# Patient Record
Sex: Female | Born: 1950 | Race: White | Hispanic: No | Marital: Married | Smoking: Never smoker
Health system: Southern US, Community
[De-identification: ages and names within clinical notes are randomized; demographics above are authoritative.]

## PROBLEM LIST (undated history)

## (undated) DIAGNOSIS — M1990 Unspecified osteoarthritis, unspecified site: Secondary | ICD-10-CM

## (undated) DIAGNOSIS — E785 Hyperlipidemia, unspecified: Secondary | ICD-10-CM

## (undated) DIAGNOSIS — J189 Pneumonia, unspecified organism: Secondary | ICD-10-CM

## (undated) DIAGNOSIS — Z8719 Personal history of other diseases of the digestive system: Secondary | ICD-10-CM

## (undated) DIAGNOSIS — M5430 Sciatica, unspecified side: Secondary | ICD-10-CM

## (undated) DIAGNOSIS — R7303 Prediabetes: Secondary | ICD-10-CM

## (undated) HISTORY — PX: BREAST BIOPSY: SHX20

## (undated) HISTORY — PX: BREAST EXCISIONAL BIOPSY: SUR124

## (undated) HISTORY — PX: JOINT REPLACEMENT: SHX530

## (undated) HISTORY — PX: HIP ARTHROPLASTY: SHX981

---

## 1998-02-22 ENCOUNTER — Ambulatory Visit (HOSPITAL_COMMUNITY): Admission: RE | Admit: 1998-02-22 | Discharge: 1998-02-22 | Payer: Self-pay | Admitting: *Deleted

## 1998-03-01 ENCOUNTER — Ambulatory Visit (HOSPITAL_COMMUNITY): Admission: RE | Admit: 1998-03-01 | Discharge: 1998-03-01 | Payer: Self-pay | Admitting: *Deleted

## 1998-03-10 ENCOUNTER — Ambulatory Visit (HOSPITAL_COMMUNITY): Admission: RE | Admit: 1998-03-10 | Discharge: 1998-03-10 | Payer: Self-pay | Admitting: *Deleted

## 1998-04-30 ENCOUNTER — Ambulatory Visit (HOSPITAL_COMMUNITY): Admission: RE | Admit: 1998-04-30 | Discharge: 1998-04-30 | Payer: Self-pay | Admitting: *Deleted

## 1999-08-15 ENCOUNTER — Ambulatory Visit (HOSPITAL_COMMUNITY): Admission: RE | Admit: 1999-08-15 | Discharge: 1999-08-15 | Payer: Self-pay | Admitting: Family Medicine

## 1999-08-15 ENCOUNTER — Encounter: Payer: Self-pay | Admitting: Family Medicine

## 1999-10-03 ENCOUNTER — Ambulatory Visit (HOSPITAL_COMMUNITY): Admission: RE | Admit: 1999-10-03 | Discharge: 1999-10-03 | Payer: Self-pay | Admitting: Emergency Medicine

## 2000-02-23 ENCOUNTER — Encounter: Payer: Self-pay | Admitting: Family Medicine

## 2000-02-23 ENCOUNTER — Encounter: Admission: RE | Admit: 2000-02-23 | Discharge: 2000-02-23 | Payer: Self-pay | Admitting: Family Medicine

## 2000-08-24 ENCOUNTER — Other Ambulatory Visit: Admission: RE | Admit: 2000-08-24 | Discharge: 2000-08-24 | Payer: Self-pay | Admitting: Family Medicine

## 2000-08-24 ENCOUNTER — Encounter: Admission: RE | Admit: 2000-08-24 | Discharge: 2000-08-24 | Payer: Self-pay | Admitting: Family Medicine

## 2000-08-24 ENCOUNTER — Encounter: Payer: Self-pay | Admitting: Family Medicine

## 2000-09-13 ENCOUNTER — Ambulatory Visit (HOSPITAL_BASED_OUTPATIENT_CLINIC_OR_DEPARTMENT_OTHER): Admission: RE | Admit: 2000-09-13 | Discharge: 2000-09-13 | Payer: Self-pay | Admitting: General Surgery

## 2000-09-13 ENCOUNTER — Encounter: Payer: Self-pay | Admitting: General Surgery

## 2000-09-13 ENCOUNTER — Encounter: Admission: RE | Admit: 2000-09-13 | Discharge: 2000-09-13 | Payer: Self-pay | Admitting: General Surgery

## 2000-09-13 ENCOUNTER — Encounter (INDEPENDENT_AMBULATORY_CARE_PROVIDER_SITE_OTHER): Payer: Self-pay | Admitting: *Deleted

## 2001-12-30 ENCOUNTER — Encounter: Admission: RE | Admit: 2001-12-30 | Discharge: 2001-12-30 | Payer: Self-pay | Admitting: Family Medicine

## 2001-12-30 ENCOUNTER — Encounter: Payer: Self-pay | Admitting: Family Medicine

## 2005-10-11 ENCOUNTER — Encounter: Payer: Self-pay | Admitting: *Deleted

## 2006-07-04 ENCOUNTER — Encounter: Admission: RE | Admit: 2006-07-04 | Discharge: 2006-07-04 | Payer: Self-pay | Admitting: Family Medicine

## 2006-10-17 ENCOUNTER — Other Ambulatory Visit: Admission: RE | Admit: 2006-10-17 | Discharge: 2006-10-17 | Payer: Self-pay | Admitting: Family Medicine

## 2007-08-14 ENCOUNTER — Encounter: Admission: RE | Admit: 2007-08-14 | Discharge: 2007-08-14 | Payer: Self-pay | Admitting: Family Medicine

## 2008-01-08 ENCOUNTER — Other Ambulatory Visit: Admission: RE | Admit: 2008-01-08 | Discharge: 2008-01-08 | Payer: Self-pay | Admitting: Family Medicine

## 2008-09-23 ENCOUNTER — Encounter: Admission: RE | Admit: 2008-09-23 | Discharge: 2008-09-23 | Payer: Self-pay | Admitting: Family Medicine

## 2010-04-27 ENCOUNTER — Encounter: Admission: RE | Admit: 2010-04-27 | Discharge: 2010-04-27 | Payer: Self-pay | Admitting: Family Medicine

## 2010-10-28 NOTE — Op Note (Signed)
[place]  Patient:    Monica Martin, Monica Martin                    MRN: [id number] Proc. Date: 09/13/00 Adm. 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  POSTOPERATIVE DIAGNOSIS:  Abnormal left breast mammogram with atypical hyperplasia.  OPERATION:  Left breast biopsy with needle localization and specimen mammography.  SURGEON:  Rose Phi. Maple Hudson, M.D.  ANESTHESIA:  MAC  DESCRIPTION OF PROCEDURE:  The patient was placed on the operating table and the left breast prepped and draped in the usual fashion.  A curvilinear incision was outlined using the previously placed wire in the upper inner quadrant as a guide.  The area is then thoroughly infiltrated with 1% xylocaine with adrenalin.  Incision was made and with sharp dissection, the wire and surrounding tissue were excised.  Hemostasis was obtained with the cautery.  Specimen mammography confirmed the removal of the lesion.  Subcuticular closure with 4-0 Monocryl and Steri-Strips carried out.  Dressing applied.  The patient transferred to the recovery room in satisfactory condition having tolerated the procedure well. DD:  09/13/00 TD:  09/13/00 Job: [id number]

## 2011-05-10 ENCOUNTER — Other Ambulatory Visit: Payer: Self-pay | Admitting: Family Medicine

## 2011-05-10 DIAGNOSIS — Z1231 Encounter for screening mammogram for malignant neoplasm of breast: Secondary | ICD-10-CM

## 2011-05-24 ENCOUNTER — Ambulatory Visit
Admission: RE | Admit: 2011-05-24 | Discharge: 2011-05-24 | Disposition: A | Discharge status: Home / Self Care | Payer: BC Managed Care – PPO | Source: Ambulatory Visit | Attending: Family Medicine | Admitting: Family Medicine

## 2011-05-24 DIAGNOSIS — Z1231 Encounter for screening mammogram for malignant neoplasm of breast: Secondary | ICD-10-CM

## 2011-12-04 ENCOUNTER — Emergency Department (HOSPITAL_COMMUNITY): Payer: BC Managed Care – PPO

## 2011-12-04 ENCOUNTER — Encounter (HOSPITAL_COMMUNITY): Payer: Self-pay

## 2011-12-04 ENCOUNTER — Observation Stay (HOSPITAL_COMMUNITY)
Admission: EM | Admit: 2011-12-04 | Discharge: 2011-12-05 | Disposition: A | Discharge status: Home / Self Care | Payer: BC Managed Care – PPO | Attending: Emergency Medicine | Admitting: Emergency Medicine

## 2011-12-04 DIAGNOSIS — R079 Chest pain, unspecified: Principal | ICD-10-CM | POA: Insufficient documentation

## 2011-12-04 HISTORY — DX: Sciatica, unspecified side: M54.30

## 2011-12-04 LAB — POCT I-STAT TROPONIN I
Troponin i, poc: 0 ng/mL (ref 0.00–0.08)
Troponin i, poc: 0 ng/mL (ref 0.00–0.08)

## 2011-12-04 LAB — BASIC METABOLIC PANEL
BUN: 13 mg/dL (ref 6–23)
CO2: 30 mEq/L (ref 19–32)
Chloride: 102 mEq/L (ref 96–112)
Creatinine, Ser: 0.78 mg/dL (ref 0.50–1.10)
GFR calc Af Amer: >90 mL/min (ref >90)
Glucose, Bld: 95 mg/dL (ref 70–99)

## 2011-12-04 LAB — CBC
HCT: 48.4 % — ABNORMAL HIGH (ref 36.0–46.0)
Hemoglobin: 15.9 g/dL — ABNORMAL HIGH (ref 12.0–15.0)
MCV: 90.1 fL (ref 78.0–100.0)
RDW: 14.1 % (ref 11.5–15.5)
WBC: 8.9 10*3/uL (ref 4.0–10.5)

## 2011-12-04 MED ORDER — SODIUM CHLORIDE 0.9 % IV BOLUS (SEPSIS)
500.0000 mL | Freq: Once | INTRAVENOUS | Status: AC
  Administered 2011-12-04: 15:00:00 via INTRAVENOUS

## 2011-12-04 MED ORDER — METOPROLOL TARTRATE 25 MG PO TABS
100.0000 mg | ORAL_TABLET | Freq: Once | ORAL | Status: AC
  Administered 2011-12-04: 100 mg via ORAL
  Filled 2011-12-04: qty 4

## 2011-12-04 NOTE — ED Notes (Signed)
Pt. To CDU

## 2011-12-04 NOTE — ED Provider Notes (Signed)
4:07 PM  Medical screening examination/treatment/procedure(s) were conducted as a shared visit with non-physician practitioner(s) and myself. I personally evaluated the patient during the encounter  61 yo lady with chest pain in the center of chest radiating to chest and into the left arm. Started yesterday PM, worse this morning. No prior heart history in her, but she has several siblings with CAD. Exam shows normal heart and lungs, EKG benign, TNI negative. I advised her to be admitted to Chest Pain Observation.        Carleene Cooper III, MD 12/04/11 2103

## 2011-12-04 NOTE — ED Notes (Signed)
Pt. Denies any pain or discomfort no sob or n/v skin is w/d, resp. E/u.  Family at bedside

## 2011-12-04 NOTE — ED Provider Notes (Signed)
History     CSN: [id number]  Arrival date & time 12/04/11  1242   First MD Initiated Contact with Patient 12/04/11 1319      Chief Complaint  Patient presents with  . Chest Pain    (Consider location/radiation/quality/duration/timing/severity/associated sxs/prior treatment) The history is provided by the patient.   is a 61 year old female with no significant past medical history who presents to the emergency department with a chief complaint of substernal chest pain that began yesterday while at work. Pain severe, described as a pressure, radiated between the shoulder blades and to the left arm. There was associated headache, nausea, shortness of breath. The pain gradually improved on some and she was able to fall asleep around 3 PM, awoke at 3 AM with worsening of the pain. At that time, it was burning rather than a pressure and rather than a headache she had some mild lightheadedness. Rated that became fairly persistent around 5 AM and she ended up going to urgent care this morning who referred her to the ER for further evaluation. Her pain was completely relieved after 2 nitroglycerin. She was also treated prior to arrival with 4 baby aspirin. At the time of interview, her symptoms include only mild nausea and left arm tingling. Symptoms are worse with exertion and improved with rest. Denies any known personal coronary disease and has no risk factors such as cigarette use, hyperlipidemia, or diabetes. Her family history is significant for coronary disease in several siblings before the age of 77. No known personal or family history of DVT or PE, no recent prolonged travel, no lower extremity swelling or pain.  Past Medical History  Diagnosis Date  . Sciatica     History reviewed. No pertinent past surgical history.  No family history on file.  History  Substance Use Topics  . Smoking status: Never Smoker   . Smokeless tobacco: Not on file  . Alcohol Use: No     Review of  Systems 10 systems reviewed and are negative for acute change except as noted in the HPI.  Allergies  Review of patient's allergies indicates no known allergies.  Home Medications   Current Outpatient Rx  Name Route Sig Dispense Refill  . ASPIRIN 81 MG PO CHEW Oral Chew 81 mg by mouth daily.    Marland Kitchen CALCIUM CARBONATE-VITAMIN D 500-200 MG-UNIT PO TABS Oral Take 1 tablet by mouth daily.    . OMEGA-3 FATTY ACIDS 1000 MG PO CAPS Oral Take 1 g by mouth daily.     . MULTI-VITAMIN/MINERALS PO TABS Oral Take 1 tablet by mouth daily.    . RED YEAST RICE 600 MG PO CAPS Oral Take 1 capsule by mouth daily.      BP 142/91  Pulse 79  Temp 97.9 F (36.6 C) (Oral)  Resp 16  Ht 5\' 6"  (1.676 m)  Wt 190 lb (86.183 kg)  BMI 30.67 kg/m2  SpO2 99%  Physical Exam  Nursing note and vitals reviewed. Constitutional: She appears well-developed and well-nourished. No distress.  HENT:  Head: Normocephalic and atraumatic.  Eyes: Conjunctivae are normal. Pupils are equal, round, and reactive to light.  Neck: Neck supple.  Cardiovascular: Normal rate, regular rhythm and normal heart sounds.        Bilateral radial and DP pulses are 2+   Pulmonary/Chest: Effort normal and breath sounds normal. No respiratory distress. She has no wheezes. She exhibits no tenderness.  Abdominal: Soft. Bowel sounds are normal. She exhibits no distension. There is no  tenderness.  Musculoskeletal: She exhibits no edema.       Calves supple and non-tender.  Neurological: She is alert.       Speech clear. MAEW.  Skin: Skin is warm and dry. No rash noted. She is not diaphoretic.  Psychiatric: She has a normal mood and affect.    ED Course  Procedures (including critical care time)  Labs Reviewed  CBC - Abnormal; Notable for the following:    RBC 5.37 (*)     Hemoglobin 15.9 (*)     HCT 48.4 (*)     Platelets 471 (*)     All other components within normal limits  BASIC METABOLIC PANEL - Abnormal; Notable for the  following:    GFR calc non Af Amer 90 (*)     All other components within normal limits  TROPONIN I   Dg Chest 2 View  12/04/2011  *RADIOLOGY REPORT*  Clinical Data: Chest pain  CHEST - 2 VIEW  Comparison: None.  Findings: Cardiomediastinal silhouette is unremarkable.  No acute infiltrate or pleural effusion.  No pulmonary edema.  Minimal degenerative changes thoracic spine.  IMPRESSION: No active disease.  Original Report Authenticated By: Natasha Mead, M.D.      MDM  CP in pt with strong FH, story somewhat concerning. EKG nml, negative troponin x 1, otherwise appears slightly dehydrated (doubt polycythemia given lack of hx of same and only slight elevation in cells), nml CXR. TIMI score 0, HEART score 2. Good candidate for CDU protocol. BMI 30.7, can coronary CT. Will do metoprolol trial now to determine response. PA Pisciotti has been given report on this patient and will continue to monitor in the CDU on evening shift.        Shaaron Adler, [place] 12/04/11 [phone]

## 2011-12-04 NOTE — ED Provider Notes (Signed)
Date: 12/04/2011  Rate: 69  Rhythm: normal sinus rhythm  QRS Axis: normal  Intervals: normal  ST/T Wave abnormalities: normal  Conduction Disutrbances:none  Narrative Interpretation: Normal EKG  Old EKG Reviewed: none available    Carleene Cooper III, MD 12/04/11 1435

## 2011-12-04 NOTE — ED Notes (Signed)
Pt requesting to have IV removed. States "It really hurts and I'm not going to be able to sleep with it in." Pt informed that she will have to have IV placed for cardiac CT and pt requesting for it to be placed in AM so she can sleep. IV site does appear red.

## 2011-12-04 NOTE — ED Provider Notes (Signed)
61 y/o female arrived in CDU under chest pain protocol resting comfortably. Denies pain currently. 2nd and 3rd troponin pending. Will follow closely  2300 - Pt resting comfortably. Denies Pain, N/V, SOB  Wynetta Emery, PA-C 12/04/11 2313

## 2011-12-04 NOTE — ED Notes (Signed)
Pt. 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 She also was having nausea.  This morning she continued to have chest pressure, nausea and lt. Arm tingling. She went to her [facility] and they sent her to [place] for further eval.    Pt. Received 2 nitro and 4 baby aspirin, she denies any pain at present time but she does continue to have mild nausea and lt. Arm is tingling.  Pts. Is in NAD

## 2011-12-04 NOTE — Progress Notes (Signed)
4:07 PM Medical screening examination/treatment/procedure(s) were conducted as a shared visit with non-physician practitioner(s) and myself.  I personally evaluated the patient during the encounter 61 yo lady with chest pain in the center of chest radiating to chest and into the left arm.  Started yesterday PM, worse this morning.  No prior heart history in her, but she has several siblings with CAD.  Exam shows normal heart and lungs, EKG benign, TNI negative.  I advised her to be admitted to Chest Pain Observation.

## 2011-12-04 NOTE — ED Notes (Signed)
Pt. Denies any chest pain or sob. Will be admitted to Observation CDU, Chest Pain Protocol.

## 2011-12-05 ENCOUNTER — Observation Stay (HOSPITAL_COMMUNITY): Admit: 2011-12-05 | Payer: BC Managed Care – PPO

## 2011-12-05 ENCOUNTER — Observation Stay (HOSPITAL_COMMUNITY): Payer: BC Managed Care – PPO

## 2011-12-05 MED ORDER — NITROGLYCERIN 0.4 MG SL SUBL
SUBLINGUAL_TABLET | SUBLINGUAL | Status: AC
  Administered 2011-12-05: 0.4 mg via SUBLINGUAL
  Filled 2011-12-05: qty 25

## 2011-12-05 MED ORDER — METOPROLOL TARTRATE 1 MG/ML IV SOLN: 10.0000 mg | Freq: Once | INTRAVENOUS | Status: AC

## 2011-12-05 MED ORDER — METOPROLOL TARTRATE 1 MG/ML IV SOLN
10.0000 mg | Freq: Once | INTRAVENOUS | Status: AC
  Administered 2011-12-05: 10 mg via INTRAVENOUS

## 2011-12-05 MED ORDER — NITROGLYCERIN 0.4 MG SL SUBL
0.4000 mg | SUBLINGUAL_TABLET | Freq: Once | SUBLINGUAL | Status: AC
  Administered 2011-12-05: 0.4 mg via SUBLINGUAL

## 2011-12-05 MED ORDER — METOPROLOL TARTRATE 1 MG/ML IV SOLN
INTRAVENOUS | Status: AC
  Administered 2011-12-05: 10 mg
  Filled 2011-12-05: qty 10

## 2011-12-05 MED ORDER — METOPROLOL TARTRATE 25 MG PO TABS
100.0000 mg | ORAL_TABLET | Freq: Once | ORAL | Status: AC
  Administered 2011-12-05: 100 mg via ORAL
  Filled 2011-12-05: qty 4

## 2011-12-05 MED ORDER — IOHEXOL 350 MG/ML SOLN
80.0000 mL | Freq: Once | INTRAVENOUS | Status: AC | PRN
  Administered 2011-12-05: 80 mL via INTRAVENOUS

## 2011-12-05 MED ORDER — METOPROLOL TARTRATE 1 MG/ML IV SOLN
INTRAVENOUS | Status: AC
  Filled 2011-12-05: qty 10

## 2011-12-05 MED ORDER — ACETAMINOPHEN 325 MG PO TABS
650.0000 mg | ORAL_TABLET | Freq: Once | ORAL | Status: AC
  Administered 2011-12-05: 650 mg via ORAL
  Filled 2011-12-05: qty 2

## 2011-12-05 NOTE — ED Notes (Signed)
Family at bedside. 

## 2011-12-05 NOTE — ED Notes (Signed)
Pt has toothbrush/paste given wash cloth family member given coffee

## 2011-12-05 NOTE — ED Notes (Signed)
Patient is resting comfortably. 

## 2011-12-05 NOTE — ED Provider Notes (Signed)
Medical screening examination/treatment/procedure(s) were conducted as a shared visit with non-physician practitioner(s) and myself.  I personally evaluated the patient during the encounter Pt seen by me earlier during the day with chest pain, placed on chest pain protocol.  Carleene Cooper III, MD 12/05/11 [phone]

## 2011-12-05 NOTE — Progress Notes (Signed)
Observation review is complete. 

## 2011-12-05 NOTE — Discharge Instructions (Signed)
Read the information below.  Please call your primary care provider today to schedule a close follow up appointment.  If you develop fevers, worsening pain, or difficulty breathing, return to the ER immediately.  You may return to the ER at any time for worsening condition or any new symptoms that concern you.

## 2011-12-05 NOTE — ED Provider Notes (Signed)
8:10 AM Patient is in CDU under observation, chest pain protocol.  This is a shared visit with Dr Ignacia Palma.  Pt reports uneventful night, no chest pain.  No concerns at this time.  On exam, pt is A&Ox4, NAD, RRR, no m/r/g, CTAB, abd soft, NT, extremities without edema, distal pulses intact and equal bilaterally.    Plan is for coronary CT this morning.    10:48 AM Discussed all results with patient.  Pt is asymptomatic at present  PCP is Dr Derrek Gu in [facility]  Plan is for d/c home with PCP follow up.  Pt encouraged to decrease stress, follow healthy diet, keep herself hydrated, return for worsening symptoms, follow closely with PCP.    Results for orders placed during the hospital encounter of 12/04/11  CBC      Component Value Range   WBC 8.9  4.0 - 10.5 K/uL   RBC 5.37 (*) 3.87 - 5.11 MIL/uL   Hemoglobin 15.9 (*) 12.0 - 15.0 g/dL   HCT 45.4 (*) 09.8 - 11.9 %   MCV 90.1  78.0 - 100.0 fL   MCH 29.6  26.0 - 34.0 pg   MCHC 32.9  30.0 - 36.0 g/dL   RDW 14.7  82.9 - 56.2 %   Platelets 471 (*) 150 - 400 K/uL  BASIC METABOLIC PANEL      Component Value Range   Sodium 142  135 - 145 mEq/L   Potassium 3.9  3.5 - 5.1 mEq/L   Chloride 102  96 - 112 mEq/L   CO2 30  19 - 32 mEq/L   Glucose, Bld 95  70 - 99 mg/dL   BUN 13  6 - 23 mg/dL   Creatinine, Ser 1.30  0.50 - 1.10 mg/dL   Calcium 86.5  8.4 - 78.4 mg/dL   GFR calc non Af Amer 90 (*) >90 mL/min   GFR calc Af Amer >90  >90 mL/min  TROPONIN I      Component Value Range   Troponin I <0.30  <0.30 ng/mL  POCT I-STAT TROPONIN I      Component Value Range   Troponin i, poc 0.00  0.00 - 0.08 ng/mL   Comment 3           POCT I-STAT TROPONIN I      Component Value Range   Troponin i, poc 0.00  0.00 - 0.08 ng/mL   Comment 3            Dg Chest 2 View  12/04/2011  *RADIOLOGY REPORT*  Clinical Data: Chest pain  CHEST - 2 VIEW  Comparison: None.  Findings: Cardiomediastinal silhouette is unremarkable.  No acute infiltrate or pleural  effusion.  No pulmonary edema.  Minimal degenerative changes thoracic spine.  IMPRESSION: No active disease.  Original Report Authenticated By: Natasha Mead, M.D.   Ct Coronary Morp W/cta Cor W/score W/ca W/cm &/or Wo/cm  12/05/2011  *RADIOLOGY REPORT*  INDICATION:  Chest pain.  CT ANGIOGRAPHY OF THE HEART, CORONARY ARTERY, STRUCTURE, AND MORPHOLOGY  CONTRAST: 80mL OMNIPAQUE IOHEXOL 350 MG/ML SOLN  COMPARISON:  Chest radiograph 12/04/2011.  TECHNIQUE:  CT angiography of the coronary vessels was performed on a 256 channel system using prospective ECG gating.  A scout and noncontrast exam (for calcium scoring) were performed.  Circulation time was measured using a test bolus.  Coronary CTA was performed with sub mm slice collimation during portions of the cardiac cycle after prior injection of iodinated contrast.  Imaging post processing  was performed on an independent workstation creating multiplanar and 3-D images, and quantitative analysis of the heart and coronary arteries.  Note that this exam targets the heart and the chest was not imaged in its entirety.  PREMEDICATION: Lopressor 100 mg, P.O. Lopressor 20 mg, IV Nitroglycerin 0.4 mcg, sublingual.  FINDINGS: Technical quality:  Excellent  Heart rate:  64  CORONARY ARTERIES: Left main coronary artery:  Widely patent. Left anterior descending:  Widely patent. Left circumflex:  Widely patent. Right coronary artery:  Widely patent. Posterior descending artery:  Widely patent. Dominance:  Right  CORONARY CALCIUM: Total Agatston Score:  0  CARDIAC MEASUREMENTS: Interventricular septum (6 - 12 mm):  6 mm LV posterior wall (6 - 12 mm):  8 mm LV diameter in diastole (35 - 52 mm):  40 mm  AORTA AND PULMONARY MEASUREMENTS: Aortic root (21 - 40 mm):             23 mm  at the annulus             33 mm  at the sinuses of Valsalva             27 mm  at the sinotubular junction Ascending aorta ( <  40 mm):  32 mm  Descending aorta ( <  40 mm):  25 mm Main pulmonary artery:  ( <   30 mm):  28 mm  EXTRACARDIAC FINDINGS: Small amount of pericardial fluid.  This extends into the superior coronary recess.  Cluster of oblong cystic lesions are present in the left hepatic lobe measuring 28 mm x 16 mm. Although partially visualized, this has benign features and fluid attenuation compatible with a simple cyst.  No specific follow-up recommended. Small hiatal hernia is present.  Thoracic esophagus grossly appears within normal limits.  Dependent atelectasis is present in the lungs. Mild descending thoracic aortic atherosclerotic calcification.  IMPRESSION:  1.  No coronary artery disease.  The patient's total coronary artery calcium score is zero. 2.  Widely patent coronary arteries. 3.  Right coronary artery dominance. 4.  Small nonspecific pericardial effusion. 5.  Small hiatal hernia.  Report was called to [facility], [place] at 1010 hours on December 05, 2011.  Original Report Authenticated By: Andreas Newport, M.D.      Rise Patience, [place] 12/05/11 1050

## 2011-12-05 NOTE — ED Provider Notes (Signed)
Medical screening examination/treatment/procedure(s) were conducted as a shared visit with non-physician practitioner(s) and myself.  I personally evaluated the patient during the encounter Pt seen by me yesterday, placed in chest pain protocol.  Coronary CT was negative; pt was counselled by Ms. West and released.  Carleene Cooper III, MD 12/05/11 [phone]

## 2011-12-05 NOTE — ED Notes (Signed)
Pt. Returned from Ct scan, tolerated Ct Coronary without any difficulty.

## 2012-04-22 ENCOUNTER — Other Ambulatory Visit: Payer: Self-pay | Admitting: *Deleted

## 2012-04-22 DIAGNOSIS — Z1231 Encounter for screening mammogram for malignant neoplasm of breast: Secondary | ICD-10-CM

## 2014-01-22 ENCOUNTER — Other Ambulatory Visit: Payer: Self-pay

## 2014-01-22 DIAGNOSIS — Z1231 Encounter for screening mammogram for malignant neoplasm of breast: Secondary | ICD-10-CM

## 2014-01-29 ENCOUNTER — Ambulatory Visit
Admission: RE | Admit: 2014-01-29 | Discharge: 2014-01-29 | Disposition: A | Discharge status: Home / Self Care | Payer: BC Managed Care – PPO | Source: Ambulatory Visit

## 2014-01-29 DIAGNOSIS — Z1231 Encounter for screening mammogram for malignant neoplasm of breast: Secondary | ICD-10-CM

## 2018-08-08 DIAGNOSIS — Z6830 Body mass index (BMI) 30.0-30.9, adult: Secondary | ICD-10-CM | POA: Diagnosis not present

## 2018-08-08 DIAGNOSIS — B029 Zoster without complications: Secondary | ICD-10-CM | POA: Diagnosis not present

## 2018-08-08 DIAGNOSIS — K449 Diaphragmatic hernia without obstruction or gangrene: Secondary | ICD-10-CM | POA: Diagnosis not present

## 2018-08-08 DIAGNOSIS — R05 Cough: Secondary | ICD-10-CM | POA: Diagnosis not present

## 2018-08-08 DIAGNOSIS — W5501XA Bitten by cat, initial encounter: Secondary | ICD-10-CM | POA: Diagnosis not present

## 2018-09-09 DIAGNOSIS — L080 Pyoderma: Secondary | ICD-10-CM | POA: Diagnosis not present

## 2018-09-09 DIAGNOSIS — J069 Acute upper respiratory infection, unspecified: Secondary | ICD-10-CM | POA: Diagnosis not present

## 2018-09-09 DIAGNOSIS — K449 Diaphragmatic hernia without obstruction or gangrene: Secondary | ICD-10-CM | POA: Diagnosis not present

## 2018-09-09 DIAGNOSIS — R0609 Other forms of dyspnea: Secondary | ICD-10-CM | POA: Diagnosis not present

## 2018-09-09 DIAGNOSIS — K219 Gastro-esophageal reflux disease without esophagitis: Secondary | ICD-10-CM | POA: Diagnosis not present

## 2019-07-28 ENCOUNTER — Ambulatory Visit: Payer: Medicare HMO | Attending: Internal Medicine

## 2019-09-23 ENCOUNTER — Other Ambulatory Visit: Payer: Self-pay | Admitting: Internal Medicine

## 2019-09-23 DIAGNOSIS — Z1231 Encounter for screening mammogram for malignant neoplasm of breast: Secondary | ICD-10-CM

## 2020-10-19 DIAGNOSIS — H5213 Myopia, bilateral: Secondary | ICD-10-CM | POA: Diagnosis not present

## 2020-10-26 DIAGNOSIS — H52209 Unspecified astigmatism, unspecified eye: Secondary | ICD-10-CM | POA: Diagnosis not present

## 2020-10-26 DIAGNOSIS — H5213 Myopia, bilateral: Secondary | ICD-10-CM | POA: Diagnosis not present

## 2020-10-26 DIAGNOSIS — H524 Presbyopia: Secondary | ICD-10-CM | POA: Diagnosis not present

## 2021-02-17 DIAGNOSIS — R739 Hyperglycemia, unspecified: Secondary | ICD-10-CM | POA: Diagnosis not present

## 2021-02-17 DIAGNOSIS — E785 Hyperlipidemia, unspecified: Secondary | ICD-10-CM | POA: Diagnosis not present

## 2021-02-24 DIAGNOSIS — M5430 Sciatica, unspecified side: Secondary | ICD-10-CM | POA: Diagnosis not present

## 2021-02-24 DIAGNOSIS — R5382 Chronic fatigue, unspecified: Secondary | ICD-10-CM | POA: Diagnosis not present

## 2021-02-24 DIAGNOSIS — R0609 Other forms of dyspnea: Secondary | ICD-10-CM | POA: Diagnosis not present

## 2021-02-24 DIAGNOSIS — R82998 Other abnormal findings in urine: Secondary | ICD-10-CM | POA: Diagnosis not present

## 2021-02-24 DIAGNOSIS — R682 Dry mouth, unspecified: Secondary | ICD-10-CM | POA: Diagnosis not present

## 2021-02-24 DIAGNOSIS — E669 Obesity, unspecified: Secondary | ICD-10-CM | POA: Diagnosis not present

## 2021-02-24 DIAGNOSIS — M2550 Pain in unspecified joint: Secondary | ICD-10-CM | POA: Diagnosis not present

## 2021-02-24 DIAGNOSIS — E785 Hyperlipidemia, unspecified: Secondary | ICD-10-CM | POA: Diagnosis not present

## 2021-02-24 DIAGNOSIS — Z1389 Encounter for screening for other disorder: Secondary | ICD-10-CM | POA: Diagnosis not present

## 2021-02-24 DIAGNOSIS — G4700 Insomnia, unspecified: Secondary | ICD-10-CM | POA: Diagnosis not present

## 2021-02-24 DIAGNOSIS — Z0000 Encounter for general adult medical examination without abnormal findings: Secondary | ICD-10-CM | POA: Diagnosis not present

## 2021-02-24 DIAGNOSIS — Z1331 Encounter for screening for depression: Secondary | ICD-10-CM | POA: Diagnosis not present

## 2021-02-24 DIAGNOSIS — Z23 Encounter for immunization: Secondary | ICD-10-CM | POA: Diagnosis not present

## 2021-02-28 ENCOUNTER — Other Ambulatory Visit: Payer: Self-pay | Admitting: Internal Medicine

## 2021-02-28 DIAGNOSIS — E785 Hyperlipidemia, unspecified: Secondary | ICD-10-CM

## 2021-03-07 DIAGNOSIS — M160 Bilateral primary osteoarthritis of hip: Secondary | ICD-10-CM | POA: Diagnosis not present

## 2021-03-22 ENCOUNTER — Ambulatory Visit
Admission: RE | Admit: 2021-03-22 | Discharge: 2021-03-22 | Disposition: A | Discharge status: Home / Self Care | Payer: No Typology Code available for payment source | Source: Ambulatory Visit | Attending: Internal Medicine | Admitting: Internal Medicine

## 2021-03-22 DIAGNOSIS — E785 Hyperlipidemia, unspecified: Secondary | ICD-10-CM

## 2021-03-22 DIAGNOSIS — I700 Atherosclerosis of aorta: Secondary | ICD-10-CM | POA: Diagnosis not present

## 2021-03-30 DIAGNOSIS — M25552 Pain in left hip: Secondary | ICD-10-CM | POA: Diagnosis not present

## 2021-03-30 DIAGNOSIS — M25551 Pain in right hip: Secondary | ICD-10-CM | POA: Diagnosis not present

## 2021-09-12 DIAGNOSIS — M1612 Unilateral primary osteoarthritis, left hip: Secondary | ICD-10-CM | POA: Diagnosis not present

## 2021-09-12 DIAGNOSIS — M1611 Unilateral primary osteoarthritis, right hip: Secondary | ICD-10-CM | POA: Diagnosis not present

## 2021-09-14 DIAGNOSIS — Z5181 Encounter for therapeutic drug level monitoring: Secondary | ICD-10-CM | POA: Diagnosis not present

## 2021-09-14 DIAGNOSIS — M1611 Unilateral primary osteoarthritis, right hip: Secondary | ICD-10-CM | POA: Diagnosis not present

## 2021-10-28 ENCOUNTER — Observation Stay (HOSPITAL_COMMUNITY): Payer: Medicare HMO

## 2021-10-28 ENCOUNTER — Inpatient Hospital Stay (HOSPITAL_COMMUNITY)
Admission: AD | Admit: 2021-10-28 | Discharge: 2021-11-04 | DRG: 920 | Disposition: A | Discharge status: Home / Self Care | Payer: Medicare HMO | Source: Other Acute Inpatient Hospital | Attending: Orthopedic Surgery | Admitting: Orthopedic Surgery

## 2021-10-28 DIAGNOSIS — N179 Acute kidney failure, unspecified: Secondary | ICD-10-CM | POA: Diagnosis present

## 2021-10-28 DIAGNOSIS — S70221A Blister (nonthermal), right hip, initial encounter: Secondary | ICD-10-CM | POA: Diagnosis present

## 2021-10-28 DIAGNOSIS — E785 Hyperlipidemia, unspecified: Secondary | ICD-10-CM | POA: Diagnosis present

## 2021-10-28 DIAGNOSIS — D62 Acute posthemorrhagic anemia: Secondary | ICD-10-CM | POA: Diagnosis present

## 2021-10-28 DIAGNOSIS — D72829 Elevated white blood cell count, unspecified: Secondary | ICD-10-CM

## 2021-10-28 DIAGNOSIS — E8809 Other disorders of plasma-protein metabolism, not elsewhere classified: Secondary | ICD-10-CM | POA: Diagnosis present

## 2021-10-28 DIAGNOSIS — E872 Acidosis, unspecified: Secondary | ICD-10-CM | POA: Diagnosis present

## 2021-10-28 DIAGNOSIS — Z96641 Presence of right artificial hip joint: Secondary | ICD-10-CM | POA: Diagnosis present

## 2021-10-28 DIAGNOSIS — R7303 Prediabetes: Secondary | ICD-10-CM | POA: Diagnosis present

## 2021-10-28 DIAGNOSIS — T8119XA Other postprocedural shock, initial encounter: Secondary | ICD-10-CM | POA: Diagnosis present

## 2021-10-28 DIAGNOSIS — E669 Obesity, unspecified: Secondary | ICD-10-CM | POA: Diagnosis present

## 2021-10-28 DIAGNOSIS — I2699 Other pulmonary embolism without acute cor pulmonale: Secondary | ICD-10-CM | POA: Clinically undetermined

## 2021-10-28 DIAGNOSIS — R339 Retention of urine, unspecified: Secondary | ICD-10-CM | POA: Diagnosis present

## 2021-10-28 DIAGNOSIS — E278 Other specified disorders of adrenal gland: Secondary | ICD-10-CM | POA: Diagnosis present

## 2021-10-28 DIAGNOSIS — R579 Shock, unspecified: Secondary | ICD-10-CM | POA: Diagnosis not present

## 2021-10-28 DIAGNOSIS — R000 Tachycardia, unspecified: Secondary | ICD-10-CM

## 2021-10-28 DIAGNOSIS — Z6830 Body mass index (BMI) 30.0-30.9, adult: Secondary | ICD-10-CM

## 2021-10-28 DIAGNOSIS — M1611 Unilateral primary osteoarthritis, right hip: Secondary | ICD-10-CM | POA: Diagnosis not present

## 2021-10-28 DIAGNOSIS — Z96649 Presence of unspecified artificial hip joint: Secondary | ICD-10-CM

## 2021-10-28 DIAGNOSIS — Y831 Surgical operation with implant of artificial internal device as the cause of abnormal reaction of the patient, or of later complication, without mention of misadventure at the time of the procedure: Secondary | ICD-10-CM | POA: Diagnosis present

## 2021-10-28 DIAGNOSIS — E876 Hypokalemia: Secondary | ICD-10-CM | POA: Diagnosis present

## 2021-10-28 DIAGNOSIS — I959 Hypotension, unspecified: Secondary | ICD-10-CM | POA: Diagnosis not present

## 2021-10-28 DIAGNOSIS — Z452 Encounter for adjustment and management of vascular access device: Secondary | ICD-10-CM | POA: Diagnosis not present

## 2021-10-28 DIAGNOSIS — R7989 Other specified abnormal findings of blood chemistry: Secondary | ICD-10-CM | POA: Diagnosis not present

## 2021-10-28 DIAGNOSIS — R008 Other abnormalities of heart beat: Secondary | ICD-10-CM | POA: Diagnosis not present

## 2021-10-28 HISTORY — DX: Unspecified osteoarthritis, unspecified site: M19.90

## 2021-10-28 HISTORY — DX: Hyperlipidemia, unspecified: E78.5

## 2021-10-28 LAB — CBC
HCT: 19.2 % — ABNORMAL LOW (ref 36.0–46.0)
Hemoglobin: 6 g/dL — CL (ref 12.0–15.0)
MCH: 30.9 pg (ref 26.0–34.0)
MCHC: 31.3 g/dL (ref 30.0–36.0)
MCV: 99 fL (ref 80.0–100.0)
Platelets: 454 10*3/uL — ABNORMAL HIGH (ref 150–400)
RBC: 1.94 MIL/uL — ABNORMAL LOW (ref 3.87–5.11)
RDW: 13.3 % (ref 11.5–15.5)
WBC: 25.3 10*3/uL — ABNORMAL HIGH (ref 4.0–10.5)
nRBC: 0 % (ref 0.0–0.2)

## 2021-10-28 LAB — TROPONIN I (HIGH SENSITIVITY)
Troponin I (High Sensitivity): 8 ng/L (ref <18)
Troponin I (High Sensitivity): 26 ng/L — ABNORMAL HIGH (ref <18)

## 2021-10-28 LAB — MRSA NEXT GEN BY PCR, NASAL: MRSA by PCR Next Gen: NOT DETECTED

## 2021-10-28 LAB — PREPARE RBC (CROSSMATCH)

## 2021-10-28 LAB — PROTIME-INR
INR: 1.6 — ABNORMAL HIGH (ref 0.8–1.2)
Prothrombin Time: 18.8 seconds — ABNORMAL HIGH (ref 11.4–15.2)

## 2021-10-28 LAB — LACTIC ACID, PLASMA: Lactic Acid, Venous: 7.5 mmol/L (ref 0.5–1.9)

## 2021-10-28 LAB — ABO/RH: ABO/RH(D): A POS

## 2021-10-28 MED ORDER — SODIUM CHLORIDE 0.9% IV SOLUTION: Freq: Once | INTRAVENOUS | Status: DC

## 2021-10-28 MED ORDER — NOREPINEPHRINE 4 MG/250ML-% IV SOLN
2.0000 ug/min | INTRAVENOUS | Status: DC
  Administered 2021-10-28: 22 ug/min via INTRAVENOUS
  Filled 2021-10-28: qty 250

## 2021-10-28 MED ORDER — NOREPINEPHRINE 4 MG/250ML-% IV SOLN: 0.0000 ug/min | INTRAVENOUS | Status: DC

## 2021-10-28 MED ORDER — MORPHINE SULFATE (PF) 2 MG/ML IV SOLN
2.0000 mg | INTRAVENOUS | Status: DC | PRN
  Administered 2021-10-28 – 2021-11-01 (×5): 2 mg via INTRAVENOUS
  Filled 2021-10-28 (×6): qty 1

## 2021-10-28 MED ORDER — OXYCODONE HCL 5 MG PO TABS
5.0000 mg | ORAL_TABLET | Freq: Four times a day (QID) | ORAL | Status: DC | PRN
  Administered 2021-10-29 – 2021-11-01 (×7): 5 mg via ORAL
  Filled 2021-10-28 (×7): qty 1

## 2021-10-28 MED ORDER — NOREPINEPHRINE 4 MG/250ML-% IV SOLN
INTRAVENOUS | Status: AC
  Administered 2021-10-28: 9 ug/min via INTRAVENOUS
  Filled 2021-10-28: qty 250

## 2021-10-28 MED ORDER — SODIUM CHLORIDE 0.9 % IV SOLN
250.0000 mL | INTRAVENOUS | Status: DC
Start: 2021-10-28 — End: 2021-11-04
  Administered 2021-10-28 – 2021-10-31 (×2): 250 mL via INTRAVENOUS

## 2021-10-28 MED ORDER — SODIUM CHLORIDE 0.9 % IV BOLUS: 500.0000 mL | Freq: Once | INTRAVENOUS | Status: DC | PRN

## 2021-10-28 MED ORDER — CHLORHEXIDINE GLUCONATE CLOTH 2 % EX PADS
6.0000 | MEDICATED_PAD | Freq: Every day | CUTANEOUS | Status: DC
  Administered 2021-10-28 – 2021-11-04 (×8): 6 via TOPICAL

## 2021-10-28 MED ORDER — ALBUMIN HUMAN 25 % IV SOLN
25.0000 g | Freq: Once | INTRAVENOUS | Status: AC
Start: 2021-10-28 — End: 2021-10-28
  Administered 2021-10-28: 25 g via INTRAVENOUS
  Filled 2021-10-28: qty 100

## 2021-10-28 MED ORDER — ALBUMIN HUMAN 25 % IV SOLN
25.0000 g | Freq: Once | INTRAVENOUS | Status: AC
  Administered 2021-10-28: 25 g via INTRAVENOUS
  Filled 2021-10-28: qty 100

## 2021-10-28 NOTE — Consult Note (Deleted)
NAMEBREEANNE Martin, MRN:  [id number], DOB:  [birth year], LOS: 0 ADMISSION DATE:  10/28/2021, CONSULTATION DATE:  10/28/21 REFERRING MD:  [place] - ortho, CHIEF COMPLAINT:  hypovolemic shock  History of Present Illness:  71 yo F hx OA hips presented to outpt surgical center 5/19 for planned hip arthroplasty. During case had EBL about 1L. Received crystalloid in resuscitation during case and did require pressors intra-op, but was able to wean off in PACU. Direct admit to [facility] floor for obvs, however in transport, patient became hypotensive and required epi gtt.   PCCM is consulted in this setting   Pertinent  Medical History  Osteoarthritis bilateral hip HLD  Sciatica  Significant Hospital Events: Including procedures, antibiotic start and stop dates in addition to other pertinent events   5/19 admit to [facility] following hip surgery due to EBL. Subsequently requiring pressors. Transfer to ICU   Interim History / Subjective:   Patient's husband and daughter at the bedside.  All questions answered They have provided consent for central line and blood transfusion if needed.   Objective   There were no vitals taken for this visit.       No intake or output data in the 24 hours ending 10/28/21 1914 There were no vitals filed for this visit.  Vitals: HR 129, BP 107/46, MAP 65, SpO2 99%. levophed  Examination: General: elderly woman, pale appearing, mild distress HENT: [place]/AT, sclera anicteric, dry mucous membranes Lungs: clear to auscultation, no wheezing Cardiovascular: tachycardic, no murmurs Abdomen: soft, non-tender, non-distended, BS+ Extremities: warm, no edema Neuro: alert but drowsy GU: n/a  Resolved Hospital Problem list     Assessment & Plan:   Shock Hypovolemic/hemorrhagic due to 1L of EBL from surgery - Continue aggressive fluid resuscitation at this time - Levophed has been started and epinephrine weaned off - MAP goal 65 or greater - Will place central  line  Acute Blood Loss Anemia - Hemoglobin 6.9g/dL - ordered 2 unit PRBCs to be transfused  Hip Arthroplasty - Ortho following - pain control with PRN morphine/oxycodone  At Risk for acute kidney injury - continue to monitor   Best Practice (right click and "Reselect all SmartList Selections" daily)   Diet/type: NPO w/ oral meds DVT prophylaxis: not indicated GI prophylaxis: N/A Lines: N/A Foley:  N/A Code Status:  full code Last date of multidisciplinary goals of care discussion [Discussed with patient and family at bedside 5/19]  Labs   CBC: Recent Labs  Lab 10/28/21 1849  WBC 25.3*  HGB 6.0*  HCT 19.2*  MCV 99.0  PLT 454*    Basic Metabolic Panel: No results for input(s): NA, K, CL, CO2, GLUCOSE, BUN, CREATININE, CALCIUM, MG, PHOS in the last 168 hours. GFR: CrCl cannot be calculated (Patient's most recent lab result is older than the maximum 21 days allowed.). Recent Labs  Lab 10/28/21 1849  WBC 25.3*    Liver Function Tests: No results for input(s): AST, ALT, ALKPHOS, BILITOT, PROT, ALBUMIN in the last 168 hours. No results for input(s): LIPASE, AMYLASE in the last 168 hours. No results for input(s): AMMONIA in the last 168 hours.  ABG No results found for: PHART, PCO2ART, PO2ART, HCO3, TCO2, ACIDBASEDEF, O2SAT   Coagulation Profile: Recent Labs  Lab 10/28/21 1849  INR 1.6*    Cardiac Enzymes: No results for input(s): CKTOTAL, CKMB, CKMBINDEX, TROPONINI in the last 168 hours.  HbA1C: No results found for: HGBA1C  CBG: No results for input(s): GLUCAP in the last  168 hours.  Review of Systems:   Review of Systems  Constitutional:  Positive for malaise/fatigue. Negative for chills, fever and weight loss.  HENT:  Negative for congestion, sinus pain and sore throat.   Eyes: Negative.   Respiratory:  Negative for cough, hemoptysis, sputum production, shortness of breath and wheezing.   Cardiovascular:  Negative for chest pain, palpitations,  orthopnea, claudication and leg swelling.  Gastrointestinal:  Negative for abdominal pain, heartburn, nausea and vomiting.  Genitourinary: Negative.   Musculoskeletal:  Negative for joint pain and myalgias.  Skin:  Negative for rash.  Neurological:  Negative for weakness.  Endo/Heme/Allergies: Negative.   Psychiatric/Behavioral: Negative.     Past Medical History:  She,  has a past medical history of Sciatica.   Surgical History:  No past surgical history on file.   Social History:   reports that she has never smoked. She does not have any smokeless tobacco history on file. She reports that she does not drink alcohol and does not use drugs.   Family History:  Her family history is not on file.   Allergies No Known Allergies   Home Medications  Prior to Admission medications   Medication Sig Start Date End Date Taking? Authorizing Provider  aspirin 81 MG chewable tablet Chew 81 mg by mouth daily.    [provider]  calcium-vitamin D (OSCAL WITH D) 500-200 MG-UNIT per tablet Take 1 tablet by mouth daily.    [provider]  fish oil-omega-3 fatty acids 1000 MG capsule Take 1 g by mouth daily.     [provider]  Multiple Vitamins-Minerals (MULTIVITAMIN WITH MINERALS) tablet Take 1 tablet by mouth daily.    [provider]  Red Yeast Rice 600 MG CAPS Take 1 capsule by mouth daily.    [provider]     Critical care time: 45 minutes    Melody Comas, MD [facility] Office: [phone]   See Amion for personal pager PCCM on call pager [phone] until 7pm. Please call Elink 7p-7a. [phone]

## 2021-10-28 NOTE — Progress Notes (Signed)
Nurse called elink and spoke with Raphael Gibney at 1951. Nurse need provider to verify placement of central line from CXR. No new orders at this time. Will continue to monitor.

## 2021-10-28 NOTE — Progress Notes (Addendum)
eLink Physician-Brief Progress Note Patient Name: Monica Martin DOB: 1950-10-28 MRN: [id number]   Date of Service  10/28/2021  HPI/Events of Note  Multiple issues: 1. Request to review CXR for central line placement. R IJ central venous line in distal SVC. No pneumothorax. 2. Hypotension  eICU Interventions  Plan: OK to use R IJ cental venous line. Monitor CVP now and Q 4 hours.      Intervention Category Major Interventions: Other:;Hypotension - evaluation and management  Lenell Antu 10/28/2021, 8:01 PM

## 2021-10-28 NOTE — Procedures (Signed)
Central Venous Catheter Insertion Procedure Note  Monica Martin  [id number]  1950/10/28  Date:10/28/21  Time:7:36 PM   Provider Performing:Teila Skalsky Veleta Miners   Procedure: Insertion of Non-tunneled Central Venous [phone]) with US guidance (28413)   Indication(s) Medication administration and Difficult access  Consent Risks of the procedure as well as the alternatives and risks of each were explained to the patient and/or caregiver.  Consent for the procedure was obtained and is signed in the bedside chart  Anesthesia Topical only with 1% lidocaine   Timeout Verified patient identification, verified procedure, site/side was marked, verified correct patient position, special equipment/implants available, medications/allergies/relevant history reviewed, required imaging and test results available.  Sterile Technique Maximal sterile technique including full sterile barrier drape, hand hygiene, sterile gown, sterile gloves, mask, hair covering, sterile ultrasound probe cover (if used).  Procedure Description Area of catheter insertion was cleaned with chlorhexidine and draped in sterile fashion.  With real-time ultrasound guidance a central venous catheter was placed into the right internal jugular vein. Guidewire visualized in vessel before cannulation. Non-pulsatile blood flow and easy flushing noted in all ports.  The catheter was sutured in place to 15 cm and sterile dressing applied.  Complications/Tolerance None; patient tolerated the procedure well. Chest X-ray is ordered to verify placement for internal jugular or subclavian cannulation.   Chest x-ray is not ordered for femoral cannulation.  EBL Minimal  Specimen(s) None   Canary Brim, MSN, APRN, NP-C, AGACNP-BC [facility] 10/28/2021, 7:36 PM   Please see [email or web address] for pager details.   From 7A-7P if no response, please call [phone] After hours, please call ELink [phone]

## 2021-10-28 NOTE — H&P (Signed)
Monica Martin, MRN:  [id number], DOB:  07/04/1950, LOS: 0 ADMISSION DATE:  10/28/2021, CONSULTATION DATE:  10/28/21 REFERRING MD:  Linna Caprice CHIEF COMPLAINT:  hypovolemic shock  History of Present Illness:  71 yo F hx OA hips presented to outpt surgical center 5/19 for planned hip arthroplasty. During case had EBL about 1L. Received crystalloid in resuscitation during case and did require pressors intra-op, but was able to wean off in PACU. Direct admit to [facility] floor for obvs, however in transport, patient became hypotensive and required epi gtt.    PCCM is consulted in this setting  Pertinent  Medical History  Osteoarthritis bilateral hip HLD  Sciatica  Significant Hospital Events: Including procedures, antibiotic start and stop dates in addition to other pertinent events   5/19 admit to [facility] following hip surgery due to EBL. Subsequently requiring pressors. Transfer to ICU   Interim History / Subjective:  Patient's husband and daughter at the bedside.  All questions answered They have provided consent for central line and blood transfusion if needed.   Objective   There were no vitals taken for this visit.       No intake or output data in the 24 hours ending 10/28/21 1916 There were no vitals filed for this visit.  Examination: General: elderly woman, pale appearing, mild distress HENT: [place]/AT, sclera anicteric, dry mucous membranes Lungs: clear to auscultation, no wheezing Cardiovascular: tachycardic, no murmurs Abdomen: soft, non-tender, non-distended, BS+ Extremities: warm, no edema Neuro: alert but drowsy GU: n/a  Resolved Hospital Problem list    Assessment & Plan:  Shock Hypovolemic/hemorrhagic due to 1L of EBL from surgery - Continue aggressive fluid resuscitation at this time - Levophed has been started and epinephrine weaned off - MAP goal 65 or greater - Will place central line   Acute Blood Loss Anemia - Hemoglobin 6.9g/dL - ordered 2 unit PRBCs to  be transfused   Hip Arthroplasty - Ortho following - pain control with PRN morphine/oxycodone   At Risk for acute kidney injury - continue to monitor  Best Practice (right click and "Reselect all SmartList Selections" daily)   Diet/type: NPO DVT prophylaxis: not indicated GI prophylaxis: N/A Lines: Central line Foley:  N/A Code Status:  full code Last date of multidisciplinary goals of care discussion [Discussed with patient and family at bedside 5/19]  Labs   CBC: Recent Labs  Lab 10/28/21 1849  WBC 25.3*  HGB 6.0*  HCT 19.2*  MCV 99.0  PLT 454*    Basic Metabolic Panel: No results for input(s): NA, K, CL, CO2, GLUCOSE, BUN, CREATININE, CALCIUM, MG, PHOS in the last 168 hours. GFR: CrCl cannot be calculated (Patient's most recent lab result is older than the maximum 21 days allowed.). Recent Labs  Lab 10/28/21 1849  WBC 25.3*    Liver Function Tests: No results for input(s): AST, ALT, ALKPHOS, BILITOT, PROT, ALBUMIN in the last 168 hours. No results for input(s): LIPASE, AMYLASE in the last 168 hours. No results for input(s): AMMONIA in the last 168 hours.  ABG No results found for: PHART, PCO2ART, PO2ART, HCO3, TCO2, ACIDBASEDEF, O2SAT   Coagulation Profile: Recent Labs  Lab 10/28/21 1849  INR 1.6*    Cardiac Enzymes: No results for input(s): CKTOTAL, CKMB, CKMBINDEX, TROPONINI in the last 168 hours.  HbA1C: No results found for: HGBA1C  CBG: No results for input(s): GLUCAP in the last 168 hours.  Review of Systems:   Review of Systems  Constitutional:  Positive for malaise/fatigue.  Negative for chills, fever and weight loss.  HENT:  Negative for congestion, sinus pain and sore throat.   Eyes: Negative.   Respiratory:  Negative for cough, hemoptysis, sputum production, shortness of breath and wheezing.   Cardiovascular:  Negative for chest pain, palpitations, orthopnea, claudication and leg swelling.  Gastrointestinal:  Negative for abdominal  pain, heartburn, nausea and vomiting.  Genitourinary: Negative.   Musculoskeletal:  Negative for joint pain and myalgias.  Skin:  Negative for rash.  Neurological:  Negative for weakness.  Endo/Heme/Allergies: Negative.   Psychiatric/Behavioral: Negative.      Past Medical History:  She,  has a past medical history of Sciatica.   Surgical History:  No past surgical history on file.   Social History:   reports that she has never smoked. She does not have any smokeless tobacco history on file. She reports that she does not drink alcohol and does not use drugs.   Family History:  Her family history is not on file.   Allergies No Known Allergies   Home Medications  Prior to Admission medications   Medication Sig Start Date End Date Taking? Authorizing Provider  aspirin 81 MG chewable tablet Chew 81 mg by mouth daily.    [provider]  calcium-vitamin D (OSCAL WITH D) 500-200 MG-UNIT per tablet Take 1 tablet by mouth daily.    [provider]  fish oil-omega-3 fatty acids 1000 MG capsule Take 1 g by mouth daily.     [provider]  Multiple Vitamins-Minerals (MULTIVITAMIN WITH MINERALS) tablet Take 1 tablet by mouth daily.    [provider]  Red Yeast Rice 600 MG CAPS Take 1 capsule by mouth daily.    [provider]     Critical care time: 45 minutes    Melody Comas, MD [facility] Office: [phone]   See Amion for personal pager PCCM on call pager [phone] until 7pm. Please call Elink 7p-7a. [phone]

## 2021-10-28 NOTE — Progress Notes (Signed)
Timeout for central line was performed by primary nurse Eber Jones, RN. Merry Proud, NP was the provider performing procedure. Dewald,MD noted at beside. All necessary equipment in the room at this time. Will continue to monitor.

## 2021-10-28 NOTE — Progress Notes (Signed)
eLink Physician-Brief Progress Note Patient Name: Monica Martin DOB: 07-28-1950 MRN: [id number]   Date of Service  10/28/2021  HPI/Events of Note  CVP = 5-6. PRBC transfusion being started.   eICU Interventions  Plan: 25% Albumin 12.5 gm IV now.      Intervention Category Major Interventions: Hypotension - evaluation and management  Lenell Antu 10/28/2021, 8:54 PM

## 2021-10-28 NOTE — Progress Notes (Signed)
Pt admitted to room 1329 from surgery center via EMS. Blood pressure 80/44, heart rate 136, room air O2 100%.  Rapid response called.  Christian RN came to the room, spoke to Dr Linna Caprice on the phone and got the order to transfer to ICU.

## 2021-10-28 NOTE — Progress Notes (Signed)
Nurse called elink and spoke to [place]. Nurse reported a CVP of 4. No new orders at time. Will continue to monitor.

## 2021-10-28 NOTE — Significant Event (Signed)
Rapid Response Event Note   Reason for Call : hypotension, post surgical procedure Notified by [facility] charge RN in regards to patient just arriving from [facility]. Patient received a right hip replacement by MD Swinteck. Patient arrived to [address] on an epinephrine which was started by EMS. Patient was hypotensive per EMS and was receiving IV fluid boluses at the time with limited blood pressure response. Patient still hypotensive upon arrival with Epinephrine gtt and fluids infusing. Systolic blood pressure trending from 80s-90s, patient drowsy but able to follow commands and answer orientation questions appropriately. Patient's skin very pale upon inspection. Called MD Swinteck in regards to patient critical hemodynamic status and needing ICU transfer with critical care consult. MD Swinteck verbally ordered for patient to be transferred to ICU.  Family at beside.   Initial Focused Assessment:  Neuro: Drowsy, able to follow commands, oriented x4 Cardiac: Hypotensive see vital signs, HR 130s  Pulmonary: RA, O2 Sats 100%  Interventions:  STAT CBC, STAT 500 cc normal saline bolus ordered per Rapid Response Protocol   Plan of Care:  Transferred patient to ICU    Event Summary:   MD Notified: MD Swinteck at 1805 Call Time: 1751  Arrival Time: Rapid Response arrived at 1800 End Time: 1900   Fiorela Pelzer C, RN

## 2021-10-28 NOTE — Progress Notes (Signed)
Nurse gave updates on patient condition to Dr. Arsenio Loader. No new orders at this time. Will continue to monitor.

## 2021-10-29 DIAGNOSIS — E278 Other specified disorders of adrenal gland: Secondary | ICD-10-CM | POA: Diagnosis present

## 2021-10-29 DIAGNOSIS — R579 Shock, unspecified: Secondary | ICD-10-CM | POA: Diagnosis not present

## 2021-10-29 DIAGNOSIS — D62 Acute posthemorrhagic anemia: Secondary | ICD-10-CM

## 2021-10-29 DIAGNOSIS — E872 Acidosis, unspecified: Secondary | ICD-10-CM | POA: Diagnosis present

## 2021-10-29 DIAGNOSIS — Y831 Surgical operation with implant of artificial internal device as the cause of abnormal reaction of the patient, or of later complication, without mention of misadventure at the time of the procedure: Secondary | ICD-10-CM | POA: Diagnosis present

## 2021-10-29 DIAGNOSIS — R7303 Prediabetes: Secondary | ICD-10-CM | POA: Diagnosis present

## 2021-10-29 DIAGNOSIS — E669 Obesity, unspecified: Secondary | ICD-10-CM | POA: Diagnosis present

## 2021-10-29 DIAGNOSIS — R339 Retention of urine, unspecified: Secondary | ICD-10-CM | POA: Diagnosis present

## 2021-10-29 DIAGNOSIS — I2699 Other pulmonary embolism without acute cor pulmonale: Secondary | ICD-10-CM | POA: Diagnosis not present

## 2021-10-29 DIAGNOSIS — Z6830 Body mass index (BMI) 30.0-30.9, adult: Secondary | ICD-10-CM | POA: Diagnosis not present

## 2021-10-29 DIAGNOSIS — E876 Hypokalemia: Secondary | ICD-10-CM | POA: Diagnosis present

## 2021-10-29 DIAGNOSIS — E785 Hyperlipidemia, unspecified: Secondary | ICD-10-CM | POA: Diagnosis present

## 2021-10-29 DIAGNOSIS — R008 Other abnormalities of heart beat: Secondary | ICD-10-CM | POA: Diagnosis not present

## 2021-10-29 DIAGNOSIS — T8119XA Other postprocedural shock, initial encounter: Secondary | ICD-10-CM | POA: Diagnosis present

## 2021-10-29 DIAGNOSIS — Z96649 Presence of unspecified artificial hip joint: Principal | ICD-10-CM

## 2021-10-29 DIAGNOSIS — Z96641 Presence of right artificial hip joint: Secondary | ICD-10-CM | POA: Diagnosis present

## 2021-10-29 DIAGNOSIS — S70221A Blister (nonthermal), right hip, initial encounter: Secondary | ICD-10-CM | POA: Diagnosis present

## 2021-10-29 DIAGNOSIS — E8809 Other disorders of plasma-protein metabolism, not elsewhere classified: Secondary | ICD-10-CM | POA: Diagnosis present

## 2021-10-29 DIAGNOSIS — N179 Acute kidney failure, unspecified: Secondary | ICD-10-CM | POA: Diagnosis present

## 2021-10-29 LAB — COMPREHENSIVE METABOLIC PANEL
ALT: 17 U/L (ref 0–44)
AST: 34 U/L (ref 15–41)
Albumin: 2.8 g/dL — ABNORMAL LOW (ref 3.5–5.0)
Alkaline Phosphatase: 30 U/L — ABNORMAL LOW (ref 38–126)
Anion gap: 6 (ref 5–15)
BUN: 19 mg/dL (ref 8–23)
CO2: 20 mmol/L — ABNORMAL LOW (ref 22–32)
Calcium: 7.3 mg/dL — ABNORMAL LOW (ref 8.9–10.3)
Chloride: 114 mmol/L — ABNORMAL HIGH (ref 98–111)
Creatinine, Ser: 1.08 mg/dL — ABNORMAL HIGH (ref 0.44–1.00)
GFR, Estimated: 56 mL/min — ABNORMAL LOW (ref >60)
Glucose, Bld: 300 mg/dL — ABNORMAL HIGH (ref 70–99)
Potassium: 4.3 mmol/L (ref 3.5–5.1)
Sodium: 140 mmol/L (ref 135–145)
Total Bilirubin: 0.9 mg/dL (ref 0.3–1.2)
Total Protein: 4.1 g/dL — ABNORMAL LOW (ref 6.5–8.1)

## 2021-10-29 LAB — CBC
HCT: 22.6 % — ABNORMAL LOW (ref 36.0–46.0)
Hemoglobin: 7.6 g/dL — ABNORMAL LOW (ref 12.0–15.0)
MCH: 31.5 pg (ref 26.0–34.0)
MCHC: 33.6 g/dL (ref 30.0–36.0)
MCV: 93.8 fL (ref 80.0–100.0)
Platelets: 297 10*3/uL (ref 150–400)
RBC: 2.41 MIL/uL — ABNORMAL LOW (ref 3.87–5.11)
RDW: 13.3 % (ref 11.5–15.5)
WBC: 17.8 10*3/uL — ABNORMAL HIGH (ref 4.0–10.5)
nRBC: 0 % (ref 0.0–0.2)

## 2021-10-29 LAB — HEMOGLOBIN AND HEMATOCRIT, BLOOD
HCT: 20.5 % — ABNORMAL LOW (ref 36.0–46.0)
Hemoglobin: 7 g/dL — ABNORMAL LOW (ref 12.0–15.0)

## 2021-10-29 LAB — LACTIC ACID, PLASMA
Lactic Acid, Venous: 4.6 mmol/L (ref 0.5–1.9)
Lactic Acid, Venous: 3.2 mmol/L (ref 0.5–1.9)

## 2021-10-29 LAB — GLUCOSE, CAPILLARY
Glucose-Capillary: 246 mg/dL — ABNORMAL HIGH (ref 70–99)
Glucose-Capillary: 209 mg/dL — ABNORMAL HIGH (ref 70–99)
Glucose-Capillary: 193 mg/dL — ABNORMAL HIGH (ref 70–99)
Glucose-Capillary: 172 mg/dL — ABNORMAL HIGH (ref 70–99)
Glucose-Capillary: 161 mg/dL — ABNORMAL HIGH (ref 70–99)

## 2021-10-29 LAB — PREPARE RBC (CROSSMATCH)

## 2021-10-29 LAB — HEMOGLOBIN A1C
Hgb A1c MFr Bld: 6 % — ABNORMAL HIGH (ref 4.8–5.6)
Mean Plasma Glucose: 125.5 mg/dL

## 2021-10-29 MED ORDER — SODIUM CHLORIDE 0.9% FLUSH: 10.0000 mL | INTRAVENOUS | Status: DC | PRN

## 2021-10-29 MED ORDER — SODIUM CHLORIDE 0.9% FLUSH
10.0000 mL | Freq: Two times a day (BID) | INTRAVENOUS | Status: DC
  Administered 2021-10-29 – 2021-10-30 (×4): 10 mL
  Administered 2021-10-31: 30 mL
  Administered 2021-10-31: 10 mL

## 2021-10-29 MED ORDER — ENOXAPARIN SODIUM 40 MG/0.4ML IJ SOSY
40.0000 mg | PREFILLED_SYRINGE | Freq: Every day | INTRAMUSCULAR | Status: DC
  Administered 2021-10-29: 40 mg via SUBCUTANEOUS
  Filled 2021-10-29: qty 0.4

## 2021-10-29 MED ORDER — SODIUM CHLORIDE 0.9% IV SOLUTION: Freq: Once | INTRAVENOUS | Status: AC

## 2021-10-29 MED ORDER — INSULIN ASPART 100 UNIT/ML IJ SOLN
0.0000 [IU] | Freq: Three times a day (TID) | INTRAMUSCULAR | Status: DC
  Administered 2021-10-29 (×2): 3 [IU] via SUBCUTANEOUS
  Administered 2021-10-29 – 2021-10-30 (×2): 2 [IU] via SUBCUTANEOUS
  Administered 2021-10-30 – 2021-11-02 (×10): 1 [IU] via SUBCUTANEOUS
  Administered 2021-11-03: 2 [IU] via SUBCUTANEOUS
  Administered 2021-11-03 – 2021-11-04 (×2): 1 [IU] via SUBCUTANEOUS

## 2021-10-29 MED ORDER — LACTATED RINGERS IV SOLN: INTRAVENOUS | Status: AC

## 2021-10-29 MED ORDER — NOREPINEPHRINE 4 MG/250ML-% IV SOLN
0.0000 ug/min | INTRAVENOUS | Status: DC
  Administered 2021-10-29 (×2): 4 ug/min via INTRAVENOUS
  Filled 2021-10-29 (×2): qty 250

## 2021-10-29 NOTE — Progress Notes (Signed)
Monica Martin, MRN:  [id number], DOB:  1950/09/25, LOS: 0 ADMISSION DATE:  10/28/2021, CONSULTATION DATE:  10/28/21 REFERRING MD:  Linna Caprice CHIEF COMPLAINT:  hypovolemic shock  History of Present Illness:  71 yo F hx OA hips presented to outpt surgical center 5/19 for planned hip arthroplasty. During case had EBL about 1L. Received crystalloid in resuscitation during case and did require pressors intra-op, but was able to wean off in PACU. Direct admit to [facility] floor for obvs, however in transport, patient became hypotensive and required epi gtt.    PCCM is consulted in this setting  Pertinent  Medical History  Osteoarthritis bilateral hip HLD  Sciatica  Significant Hospital Events: Including procedures, antibiotic start and stop dates in addition to other pertinent events   5/19 admit to [facility] following hip surgery due to EBL. Subsequently requiring pressors. Transfer to ICU   Interim History / Subjective:   Patient received 2 units PRBCs overnight Levophed at  She is feeling much better this morning Husband is at bedside. All questions answered.  Received in/out cath for urinary retention  Objective   Blood pressure (!) 108/43, pulse (!) 126, temperature 98 F (36.7 C), temperature source Axillary, resp. rate 17, SpO2 97 %. CVP:  [1 mmHg-11 mmHg] 6 mmHg      Intake/Output Summary (Last 24 hours) at 10/29/2021 0759 Last data filed at 10/29/2021 1245 Gross per 24 hour  Intake 1048.15 ml  Output 600 ml  Net 448.15 ml   There were no vitals filed for this visit.  Examination: General: no acute distress, resting in bed HENT: [name]/AT, sclera anicteric, moist mucous membranes Lungs: clear to auscultation, no wheezing Cardiovascular: tachycardic, no murmurs Abdomen: soft, non-tender, non-distended, BS+ Extremities: warm, no edema Neuro: alert, moving all extremities GU: n/a  Resolved Hospital Problem list    Assessment & Plan:  Shock Lactic  Acidosis Hypovolemic/hemorrhagic due to 1L of EBL from surgery - Received 2 units PRBCs, post transfusion hemoglobin 7.6 g/dL - Start LR 809XI for 10 hours - Will given another unit of PRBCs if not off levophed by this afternoon - wean levophed for MAP goal 65 or greater   Acute Blood Loss Anemia - Hemoglobin 6.0g/dL to 3.3A/SN post 2 units PRBCs - continue to monitor H/H   Hip Arthroplasty - Ortho following - pain control with PRN morphine/oxycodone   Acute kidney injury - continue to monitor Cr and UOP - Resuscitation as above  Hyperglycemia - q4hr CBGs - Will start SSI if needed  Best Practice (right click and "Reselect all SmartList Selections" daily)   Diet/type: clear liquids, advance as tolerated DVT prophylaxis: LMWH GI prophylaxis: N/A Lines: Central line Foley:  N/A Code Status:  full code Last date of multidisciplinary goals of care discussion [Discussed with patient and family at bedside 5/19]  Labs   CBC: Recent Labs  Lab 10/28/21 1849 10/29/21 0315  WBC 25.3* 17.8*  HGB 6.0* 7.6*  HCT 19.2* 22.6*  MCV 99.0 93.8  PLT 454* 297     Basic Metabolic Panel: Recent Labs  Lab 10/29/21 0315  NA 140  K 4.3  CL 114*  CO2 20*  GLUCOSE 300*  BUN 19  CREATININE 1.08*  CALCIUM 7.3*   GFR: CrCl cannot be calculated (Unknown ideal weight.). Recent Labs  Lab 10/28/21 1849 10/28/21 1854 10/29/21 0315  WBC 25.3*  --  17.8*  LATICACIDVEN  --  7.5* 4.6*     Liver Function Tests: Recent Labs  Lab  10/29/21 0315  AST 34  ALT 17  ALKPHOS 30*  BILITOT 0.9  PROT 4.1*  ALBUMIN 2.8*   No results for input(s): LIPASE, AMYLASE in the last 168 hours. No results for input(s): AMMONIA in the last 168 hours.  ABG No results found for: PHART, PCO2ART, PO2ART, HCO3, TCO2, ACIDBASEDEF, O2SAT   Coagulation Profile: Recent Labs  Lab 10/28/21 1849  INR 1.6*     Cardiac Enzymes: No results for input(s): CKTOTAL, CKMB, CKMBINDEX, TROPONINI in the  last 168 hours.  HbA1C: No results found for: HGBA1C  CBG: No results for input(s): GLUCAP in the last 168 hours.   Critical care time: 35 minutes    Melody Comas, MD [facility] Office: [phone]   See Amion for personal pager PCCM on call pager [phone] until 7pm. Please call Elink 7p-7a. [phone]

## 2021-10-29 NOTE — Progress Notes (Signed)
   Subjective:   POD #1 S/P RIGHT TOTAL HIP ARTHROPLASTY    Patient reports pain as mild.   Patient seen in rounds for Dr. Linna Caprice. Patient is resting in bed this morning on exam. PT just finished working with her. Family at the bedside.  We will start therapy today.   Objective: Vital signs in last 24 hours: Temp:  [97.1 F (36.2 C)-99 F (37.2 C)] 99 F (37.2 C) (05/20 0800) Pulse Rate:  [112-145] 123 (05/20 0815) Resp:  [11-23] 11 (05/20 0815) BP: (73-146)/(35-110) 94/48 (05/20 0815) SpO2:  [88 %-100 %] 98 % (05/20 0815)  Intake/Output from previous day:  Intake/Output Summary (Last 24 hours) at 10/29/2021 1038 Last data filed at 10/29/2021 0800 Gross per 24 hour  Intake 1102.46 ml  Output 600 ml  Net 502.46 ml     Intake/Output this shift: Total I/O In: 43.1 [I.V.:43.1] Out: -   Labs: Recent Labs    10/28/21 1849 10/29/21 0315  HGB 6.0* 7.6*   Recent Labs    10/28/21 1849 10/29/21 0315  WBC 25.3* 17.8*  RBC 1.94* 2.41*  HCT 19.2* 22.6*  PLT 454* 297   Recent Labs    10/29/21 0315  NA 140  K 4.3  CL 114*  CO2 20*  BUN 19  CREATININE 1.08*  GLUCOSE 300*  CALCIUM 7.3*   Recent Labs    10/28/21 1849  INR 1.6*    Exam: General - Patient is Alert and Oriented Extremity - Neurologically intact Sensation intact distally Intact pulses distally Dorsiflexion/Plantar flexion intact Compartment soft Dressing - dressing C/D/I. No evidence of surgical site bleeding. Thigh soft.  Motor Function - intact, moving foot and toes well on exam.   Past Medical History:  Diagnosis Date   Sciatica     Assessment/Plan:    Active Problems:   ABLA (acute blood loss anemia)  Estimated body mass index is 30.67 kg/m as calculated from the following:   Height as of 12/04/11: 5\' 6"  (1.676 m).   Weight as of 12/04/11: 86.2 kg.  ABLA: Hgb 7.6, s/p 2 units PRBC  DVT Prophylaxis - On Lovenox here Weight bearing as tolerated. No hip precautions.    Patient reports plan per CC physician is hopeful d/c to floor tomorrow. Will continue to follow.   12/06/11, PA-C Orthopedic Surgery [phone] 10/29/2021, 10:38 AM

## 2021-10-29 NOTE — Progress Notes (Signed)
eLink Physician-Brief Progress Note Patient Name: Monica Martin DOB: [birth year] MRN: [id number]   Date of Service  10/29/2021  HPI/Events of Note  Urinary retention - Urine output 50 mL for shift and bladder scan reveals >522 mL.  eICU Interventions  Plan: I/O Cath PRN.       Intervention Category Major Interventions: Other:  Monica Martin 10/29/2021, 6:02 AM

## 2021-10-29 NOTE — Progress Notes (Addendum)
eLink Physician-Brief Progress Note Patient Name: Monica Martin DOB: August 01, 1950 MRN: [id number]   Date of Service  10/29/2021  HPI/Events of Note  Patient with recurrent urinary retention despite in / out bladder catheterization x 3, she's also still a bit vasoplegic from anesthesia with a low diastolic blood pressure but her MAP is 71 mmHg.  Husband was worried about the diastolic pressure.  eICU Interventions  Foley catheter ordered, she is on 4 mcg of Levophed currently. I explained the vasoplegia and reassured the husband.        Thomasene Lot Linna Thebeau 10/29/2021, 10:35 PM

## 2021-10-29 NOTE — Progress Notes (Signed)
Patient was I&O by primary nurse and a total of was obtained. No new orders at this time. Will continue to monitor.

## 2021-10-29 NOTE — Progress Notes (Signed)
Nurse called to over to elink and spoke with Erskine Squibb. Nurse noted that the patient has only voided 35ml via purewick. After bladder scan the patient is noted to have in the bladder. Patient states she has a lot of pain when moving and bending left leg. No new orders at this time. Will continue to monitor.

## 2021-10-30 DIAGNOSIS — D62 Acute posthemorrhagic anemia: Secondary | ICD-10-CM | POA: Diagnosis not present

## 2021-10-30 LAB — GLUCOSE, CAPILLARY
Glucose-Capillary: 170 mg/dL — ABNORMAL HIGH (ref 70–99)
Glucose-Capillary: 167 mg/dL — ABNORMAL HIGH (ref 70–99)
Glucose-Capillary: 135 mg/dL — ABNORMAL HIGH (ref 70–99)
Glucose-Capillary: 130 mg/dL — ABNORMAL HIGH (ref 70–99)
Glucose-Capillary: 126 mg/dL — ABNORMAL HIGH (ref 70–99)

## 2021-10-30 LAB — BASIC METABOLIC PANEL
Anion gap: 5 (ref 5–15)
BUN: 13 mg/dL (ref 8–23)
CO2: 26 mmol/L (ref 22–32)
Calcium: 7.6 mg/dL — ABNORMAL LOW (ref 8.9–10.3)
Chloride: 112 mmol/L — ABNORMAL HIGH (ref 98–111)
Creatinine, Ser: 0.75 mg/dL (ref 0.44–1.00)
GFR, Estimated: >60 mL/min (ref >60)
Glucose, Bld: 181 mg/dL — ABNORMAL HIGH (ref 70–99)
Potassium: 3.6 mmol/L (ref 3.5–5.1)
Sodium: 143 mmol/L (ref 135–145)

## 2021-10-30 LAB — CBC
HCT: 19.8 % — ABNORMAL LOW (ref 36.0–46.0)
HCT: 23.8 % — ABNORMAL LOW (ref 36.0–46.0)
Hemoglobin: 6.6 g/dL — CL (ref 12.0–15.0)
Hemoglobin: 8 g/dL — ABNORMAL LOW (ref 12.0–15.0)
MCH: 31 pg (ref 26.0–34.0)
MCH: 30.5 pg (ref 26.0–34.0)
MCHC: 33.3 g/dL (ref 30.0–36.0)
MCHC: 33.6 g/dL (ref 30.0–36.0)
MCV: 93 fL (ref 80.0–100.0)
MCV: 90.8 fL (ref 80.0–100.0)
Platelets: 246 10*3/uL (ref 150–400)
Platelets: 254 10*3/uL (ref 150–400)
RBC: 2.13 MIL/uL — ABNORMAL LOW (ref 3.87–5.11)
RBC: 2.62 MIL/uL — ABNORMAL LOW (ref 3.87–5.11)
RDW: 14.5 % (ref 11.5–15.5)
RDW: 15.5 % (ref 11.5–15.5)
WBC: 8.7 10*3/uL (ref 4.0–10.5)
WBC: 9.3 10*3/uL (ref 4.0–10.5)
nRBC: 0 % (ref 0.0–0.2)
nRBC: 0.2 % (ref 0.0–0.2)

## 2021-10-30 LAB — PREPARE RBC (CROSSMATCH)

## 2021-10-30 LAB — CORTISOL: Cortisol, Plasma: 29.2 ug/dL

## 2021-10-30 MED ORDER — LACTATED RINGERS IV BOLUS
1000.0000 mL | Freq: Once | INTRAVENOUS | Status: AC
  Administered 2021-10-30: 1000 mL via INTRAVENOUS

## 2021-10-30 MED ORDER — CEFADROXIL 500 MG PO CAPS
1000.0000 mg | ORAL_CAPSULE | Freq: Two times a day (BID) | ORAL | Status: AC
  Administered 2021-10-30 – 2021-11-03 (×10): 1000 mg via ORAL
  Filled 2021-10-30 (×2): qty 2
  Filled 2021-10-30: qty 1
  Filled 2021-10-30 (×10): qty 2

## 2021-10-30 MED ORDER — SODIUM CHLORIDE 0.9% IV SOLUTION: Freq: Once | INTRAVENOUS | Status: AC

## 2021-10-30 NOTE — Plan of Care (Signed)
  Problem: Coping: Goal: Level of anxiety will decrease Outcome: Progressing   Problem: Pain Managment: Goal: General experience of comfort will improve Outcome: Progressing   

## 2021-10-30 NOTE — Progress Notes (Signed)
Monica Martin, MRN:  [id number], DOB:  21-Mar-1951, LOS: 1 ADMISSION DATE:  10/28/2021, CONSULTATION DATE:  10/28/21 REFERRING MD:  Linna Caprice CHIEF COMPLAINT:  hypovolemic shock  History of Present Illness:  71 yo F hx OA hips presented to outpt surgical center 5/19 for planned hip arthroplasty. During case had EBL about 1L. Received crystalloid in resuscitation during case and did require pressors intra-op, but was able to wean off in PACU. Direct admit to [facility] floor for obvs, however in transport, patient became hypotensive and required epi gtt.    PCCM is consulted in this setting  Pertinent  Medical History  Osteoarthritis bilateral hip HLD  Sciatica  Significant Hospital Events: Including procedures, antibiotic start and stop dates in addition to other pertinent events   5/19 admit to [facility] following hip surgery due to EBL. Subsequently requiring pressors. Transfer to ICU, 2 units PRBCs 5/20 remained on low dose levophed, transfused 1 units PRBCs  Interim History / Subjective:   Patient received 1 units PRBCs last night (total of 3 units since admission) Levophed weaned off at 6am Hemoglobin 6.6 g/dL this AM  She is feeling much better this morning. Complains of right leg heavyness. She is not feeling very hungry and has some nausea. Husband is at bedside. All questions answered.  Foley catheter placed overnight due to urinary retention  Objective   Blood pressure (!) 137/47, pulse (!) 127, temperature 97.7 F (36.5 C), temperature source Oral, resp. rate 17, height 5\' 6"  (1.676 m), weight 99.8 kg, SpO2 94 %. CVP:  [4 mmHg-20 mmHg] 10 mmHg      Intake/Output Summary (Last 24 hours) at 10/30/2021 0734 Last data filed at 10/30/2021 11/01/2021 Gross per 24 hour  Intake 2607.67 ml  Output 1220 ml  Net 1387.67 ml   Filed Weights   10/29/21 1200  Weight: 99.8 kg    Examination: General: no acute distress, resting in bed HENT: [place]/AT, sclera anicteric, moist mucous  membranes Lungs: clear to auscultation, no wheezing Cardiovascular: tachycardic, no murmurs Abdomen: soft, non-tender, non-distended, BS+, no bruising noted or flank burising Extremities: warm, edematous right thigh - no bruising noted Neuro: alert, moving all extremities GU: foley in place  Resolved Hospital Problem list    Assessment & Plan:  Shock Lactic Acidosis Hypovolemic/hemorrhagic due to 1L of EBL from surgery - Received 4 units PRBCs since admission - Levophed weaned off this AM   Acute Blood Loss Anemia - continue to monitor H/H - Transfuse for hemoglobin < 7g/dL - transfusing 4th PRBC this AM - Given on going transfusion needs and no overt bleeding noted, will monitor for more occult reasons such as thigh hematoma vs RP bleed vs psoas muscle hematoma  Hip Arthroplasty - Ortho following - pain control with PRN morphine/oxycodone   Acute kidney injury, improved - continue to monitor Cr and UOP - Resuscitation as above  Hyperglycemia - SSI - hemoglobin A1C 6.0  Best Practice (right click and "Reselect all SmartList Selections" daily)   Diet/type: clear liquids, advance as tolerated DVT prophylaxis: LMWH GI prophylaxis: N/A Lines: Central line Foley:  N/A Code Status:  full code Last date of multidisciplinary goals of care discussion [Discussed with patient and family at bedside 5/19]  Labs   CBC: Recent Labs  Lab 10/28/21 1849 10/29/21 0315 10/29/21 2155 10/30/21 0645  WBC 25.3* 17.8*  --  8.7  HGB 6.0* 7.6* 7.0* 6.6*  HCT 19.2* 22.6* 20.5* 19.8*  MCV 99.0 93.8  --  93.0  PLT 454* 297  --  246    Basic Metabolic Panel: Recent Labs  Lab 10/29/21 0315 10/30/21 0645  NA 140 143  K 4.3 3.6  CL 114* 112*  CO2 20* 26  GLUCOSE 300* 181*  BUN 19 13  CREATININE 1.08* 0.75  CALCIUM 7.3* 7.6*   GFR: Estimated Creatinine Clearance: 79.1 mL/min (by C-G formula based on SCr of 0.75 mg/dL). Recent Labs  Lab 10/28/21 1849 10/28/21 1854  10/29/21 0315 10/29/21 0845 10/30/21 0645  WBC 25.3*  --  17.8*  --  8.7  LATICACIDVEN  --  7.5* 4.6* 3.2*  --     Liver Function Tests: Recent Labs  Lab 10/29/21 0315  AST 34  ALT 17  ALKPHOS 30*  BILITOT 0.9  PROT 4.1*  ALBUMIN 2.8*   No results for input(s): LIPASE, AMYLASE in the last 168 hours. No results for input(s): AMMONIA in the last 168 hours.  ABG No results found for: PHART, PCO2ART, PO2ART, HCO3, TCO2, ACIDBASEDEF, O2SAT   Coagulation Profile: Recent Labs  Lab 10/28/21 1849  INR 1.6*    Cardiac Enzymes: No results for input(s): CKTOTAL, CKMB, CKMBINDEX, TROPONINI in the last 168 hours.  HbA1C: Hgb A1c MFr Bld  Date/Time Value Ref Range Status  10/29/2021 08:38 AM 6.0 (H) 4.8 - 5.6 % Final    Comment:    (NOTE) Pre diabetes:          5.7%-6.4%  Diabetes:              >6.4%  Glycemic control for   <7.0% adults with diabetes     CBG: Recent Labs  Lab 10/29/21 1212 10/29/21 1603 10/29/21 1923 10/29/21 2314 10/30/21 0359  GLUCAP 209* 193* 172* 161* 170*     Critical care time: 35 minutes    Melody Comas, MD [facility] Office: [phone]   See Amion for personal pager PCCM on call pager [phone] until 7pm. Please call Elink 7p-7a. [phone]

## 2021-10-30 NOTE — Progress Notes (Signed)
Subjective:   POD #2 s/p Right THA Patient seen in rounds for Dr. Linna Caprice Patient reports pain as moderate.   Physical therapy has not been by to see her today, states that she is still not feeling great.  Leg feels heavy.  She is having some redness around the incision site.  Denies any fevers or chills.  Overall is doing okay.  She does have another unit of red blood cells hanging at time of exam.  Objective: Vital signs in last 24 hours: Temp:  [97.7 F (36.5 C)-99.9 F (37.7 C)] 98.3 F (36.8 C) (05/21 0837) Pulse Rate:  [110-139] 115 (05/21 0837) Resp:  [10-25] 14 (05/21 0837) BP: (85-155)/(30-67) 103/44 (05/21 0837) SpO2:  [91 %-100 %] 92 % (05/21 0837) Weight:  [99.8 kg] 99.8 kg (05/20 1200)  Intake/Output from previous day: 05/20 0701 - 05/21 0700 In: 2607.7 [P.O.:240; I.V.:2073.7; Blood:294] Out: 1220 [Urine:1220] Intake/Output this shift: No intake/output data recorded.  Recent Labs    10/28/21 1849 10/29/21 0315 10/29/21 2155 10/30/21 0645  HGB 6.0* 7.6* 7.0* 6.6*   Recent Labs    10/29/21 0315 10/29/21 2155 10/30/21 0645  WBC 17.8*  --  8.7  RBC 2.41*  --  2.13*  HCT 22.6* 20.5* 19.8*  PLT 297  --  246   Recent Labs    10/29/21 0315 10/30/21 0645  NA 140 143  K 4.3 3.6  CL 114* 112*  CO2 20* 26  BUN 19 13  CREATININE 1.08* 0.75  GLUCOSE 300* 181*  CALCIUM 7.3* 7.6*   Recent Labs    10/28/21 1849  INR 1.6*    Neurologically intact Sensation intact distally Intact pulses distally Dorsiflexion/Plantar flexion intact Incision: dressing C/D/I Compartment soft Redness around the incision site   Assessment/Plan:   postop day #2 s/p right THA ABLA  DVT ppx: lovenox while here WBAT No hip recautions Will start Keflex for some redness around incision site    Ceasar Mons [phone] 10/30/2021, 9:07 AM

## 2021-10-31 DIAGNOSIS — D62 Acute posthemorrhagic anemia: Secondary | ICD-10-CM | POA: Diagnosis not present

## 2021-10-31 DIAGNOSIS — Z96641 Presence of right artificial hip joint: Secondary | ICD-10-CM | POA: Diagnosis not present

## 2021-10-31 LAB — TYPE AND SCREEN
ABO/RH(D): A POS
Antibody Screen: NEGATIVE
Unit division: 0
Unit division: 0
Unit division: 0
Unit division: 0

## 2021-10-31 LAB — BPAM RBC
Blood Product Expiration Date: 202306042359
Blood Product Expiration Date: 202306142359
Blood Product Expiration Date: 202306142359
Blood Product Expiration Date: 202306142359
ISSUE DATE / TIME: 202305192048
ISSUE DATE / TIME: 202305200032
ISSUE DATE / TIME: 202305201651
ISSUE DATE / TIME: 202305210754
Unit Type and Rh: 6200
Unit Type and Rh: 6200
Unit Type and Rh: 6200
Unit Type and Rh: 6200

## 2021-10-31 LAB — GLUCOSE, CAPILLARY
Glucose-Capillary: 111 mg/dL — ABNORMAL HIGH (ref 70–99)
Glucose-Capillary: 136 mg/dL — ABNORMAL HIGH (ref 70–99)
Glucose-Capillary: 137 mg/dL — ABNORMAL HIGH (ref 70–99)
Glucose-Capillary: 140 mg/dL — ABNORMAL HIGH (ref 70–99)
Glucose-Capillary: 141 mg/dL — ABNORMAL HIGH (ref 70–99)
Glucose-Capillary: 150 mg/dL — ABNORMAL HIGH (ref 70–99)

## 2021-10-31 LAB — COMPREHENSIVE METABOLIC PANEL
ALT: 22 U/L (ref 0–44)
AST: 37 U/L (ref 15–41)
Albumin: 2.4 g/dL — ABNORMAL LOW (ref 3.5–5.0)
Alkaline Phosphatase: 46 U/L (ref 38–126)
Anion gap: 5 (ref 5–15)
BUN: 11 mg/dL (ref 8–23)
CO2: 28 mmol/L (ref 22–32)
Calcium: 7.6 mg/dL — ABNORMAL LOW (ref 8.9–10.3)
Chloride: 109 mmol/L (ref 98–111)
Creatinine, Ser: 0.67 mg/dL (ref 0.44–1.00)
GFR, Estimated: >60 mL/min (ref >60)
Glucose, Bld: 150 mg/dL — ABNORMAL HIGH (ref 70–99)
Potassium: 3.3 mmol/L — ABNORMAL LOW (ref 3.5–5.1)
Sodium: 142 mmol/L (ref 135–145)
Total Bilirubin: 0.9 mg/dL (ref 0.3–1.2)
Total Protein: 4.4 g/dL — ABNORMAL LOW (ref 6.5–8.1)

## 2021-10-31 LAB — CBC
HCT: 23.1 % — ABNORMAL LOW (ref 36.0–46.0)
Hemoglobin: 7.8 g/dL — ABNORMAL LOW (ref 12.0–15.0)
MCH: 31.1 pg (ref 26.0–34.0)
MCHC: 33.8 g/dL (ref 30.0–36.0)
MCV: 92 fL (ref 80.0–100.0)
Platelets: 240 10*3/uL (ref 150–400)
RBC: 2.51 MIL/uL — ABNORMAL LOW (ref 3.87–5.11)
RDW: 15.9 % — ABNORMAL HIGH (ref 11.5–15.5)
WBC: 9 10*3/uL (ref 4.0–10.5)
nRBC: 0.3 % — ABNORMAL HIGH (ref 0.0–0.2)

## 2021-10-31 LAB — MAGNESIUM: Magnesium: 2 mg/dL (ref 1.7–2.4)

## 2021-10-31 MED ORDER — POTASSIUM CHLORIDE 20 MEQ PO PACK
40.0000 meq | PACK | Freq: Once | ORAL | Status: AC
  Administered 2021-10-31: 40 meq via ORAL
  Filled 2021-10-31: qty 2

## 2021-10-31 MED ORDER — CALCIUM GLUCONATE-NACL 1-0.675 GM/50ML-% IV SOLN
1.0000 g | Freq: Once | INTRAVENOUS | Status: AC
  Administered 2021-10-31: 1000 mg via INTRAVENOUS
  Filled 2021-10-31: qty 50

## 2021-10-31 NOTE — Progress Notes (Addendum)
NAME:  Monica Martin, MRN:  [id number], DOB:  07-03-1950, LOS: 2 ADMISSION DATE:  10/28/2021, CONSULTATION DATE:  10/28/21 REFERRING MD:  Linna Caprice CHIEF COMPLAINT:  hypovolemic shock  History of Present Illness:  71 yo F hx OA hips presented to outpt surgical center 5/19 for planned hip arthroplasty. During case had EBL about 1L. Received crystalloid in resuscitation during case and did require pressors intra-op, but was able to wean off in PACU. Direct admit to [facility] floor for obvs, however in transport, patient became hypotensive and required epi gtt.    PCCM is consulted in this setting  Pertinent  Medical History  Osteoarthritis bilateral hip HLD  Sciatica  Significant Hospital Events: Including procedures, antibiotic start and stop dates in addition to other pertinent events   5/19 admit to [facility] following hip surgery due to EBL. Subsequently requiring pressors. Transfer to ICU  5/22 Off pressors, Hgb stable  Interim History / Subjective:   Stable Hgb, no transfusions since yesterday Off Levophed  R leg feels heavy  Objective   Blood pressure (!) 144/60, pulse (!) 110, temperature 98.5 F (36.9 C), temperature source Oral, resp. rate 17, height 5\' 6"  (1.676 m), weight 99.8 kg, SpO2 92 %. CVP:  [10 mmHg] 10 mmHg      Intake/Output Summary (Last 24 hours) at 10/31/2021 0756 Last data filed at 10/31/2021 0600 Gross per 24 hour  Intake 1591.87 ml  Output 650 ml  Net 941.87 ml    Filed Weights   10/29/21 1200  Weight: 99.8 kg   General:  ill-appearing F, resting in bed awake and in no distress HEENT: MM pale/moist, sclera anicteric Neuro: awake, alert, oriented x3 and following commands CV: s1s2 rrr, no m/r/g PULM:  lungs clear bilaterally without wheezing or rhonchi GI: soft, non-tender and non-distended Extremities: warm/dry, bilat thighs soft, no significant LE edema  Skin: no rashes or lesions   Resolved Hospital Problem list   Hemorrhagic Shock AKI  Assessment  & Plan:  Shock-resolved Lactic Acidosis Hypovolemic/hemorrhagic due to 1L of EBL from surgery Received 4 units PRBC's total  -improved with transfusion and IVF, off pressors and Hgb stable at 7.8 -stable, no current critical care needs, PCCM will sign off, please re-engage for any concerns   Acute Blood Loss Anemia - Hemoglobin 6.0g/dL to 10/31/21 post 2 units PRBCs - stable today, continue to monitor   Hip Arthroplasty - Ortho following, started Cefadroxil - continue pain control with PRN morphine/oxycodone   Acute kidney injury Improved creatinine from 1.08 to 0.67 today - continue to monitor Cr and UOP   Hyperglycemia - q4hr CBGs -continue SSI   Hypokalemia Hypocalcemia -replete today and continue to follow -check Mag level  Best Practice (right click and "Reselect all SmartList Selections" daily)   Diet/type: clear liquids, advance as tolerated DVT prophylaxis: LMWH GI prophylaxis: N/A Lines: Central line and No longer needed.  Order written to d/c  Foley:  removal ordered  Code Status:  full code Last date of multidisciplinary goals of care discussion [Discussed with patient and family at bedside 5/19]  Pt and family updated 5/22  Labs   CBC: Recent Labs  Lab 10/28/21 1849 10/29/21 0315 10/29/21 2155 10/30/21 0645 10/30/21 1322 10/31/21 0640  WBC 25.3* 17.8*  --  8.7 9.3 9.0  HGB 6.0* 7.6* 7.0* 6.6* 8.0* 7.8*  HCT 19.2* 22.6* 20.5* 19.8* 23.8* 23.1*  MCV 99.0 93.8  --  93.0 90.8 92.0  PLT 454* 297  --  246 254 240  Basic Metabolic Panel: Recent Labs  Lab 10/29/21 0315 10/30/21 0645 10/31/21 0640  NA 140 143 142  K 4.3 3.6 3.3*  CL 114* 112* 109  CO2 20* 26 28  GLUCOSE 300* 181* 150*  BUN 19 13 11   CREATININE 1.08* 0.75 0.67  CALCIUM 7.3* 7.6* 7.6*    GFR: Estimated Creatinine Clearance: 79.1 mL/min (by C-G formula based on SCr of 0.67 mg/dL). Recent Labs  Lab 10/28/21 1854 10/29/21 0315 10/29/21 0845 10/30/21 0645  10/30/21 1322 10/31/21 0640  WBC  --  17.8*  --  8.7 9.3 9.0  LATICACIDVEN 7.5* 4.6* 3.2*  --   --   --      Liver Function Tests: Recent Labs  Lab 10/29/21 0315 10/31/21 0640  AST 34 37  ALT 17 22  ALKPHOS 30* 46  BILITOT 0.9 0.9  PROT 4.1* 4.4*  ALBUMIN 2.8* 2.4*    No results for input(s): LIPASE, AMYLASE in the last 168 hours. No results for input(s): AMMONIA in the last 168 hours.  ABG No results found for: PHART, PCO2ART, PO2ART, HCO3, TCO2, ACIDBASEDEF, O2SAT   Coagulation Profile: Recent Labs  Lab 10/28/21 1849  INR 1.6*     Cardiac Enzymes: No results for input(s): CKTOTAL, CKMB, CKMBINDEX, TROPONINI in the last 168 hours.  HbA1C: Hgb A1c MFr Bld  Date/Time Value Ref Range Status  10/29/2021 08:38 AM 6.0 (H) 4.8 - 5.6 % Final    Comment:    (NOTE) Pre diabetes:          5.7%-6.4%  Diabetes:              >6.4%  Glycemic control for   <7.0% adults with diabetes     CBG: Recent Labs  Lab 10/30/21 1624 10/30/21 1936 10/30/21 2343 10/31/21 0349 10/31/21 0733  GLUCAP 130* 126* 136* 137* 140*     Critical care time: n/a     11/02/21 Sohum Delillo, PA-C [facility] See Amion for pager If no response to pager , please call [phone] until 7pm After 7:00 pm call Elink  Darcella Gasman?[phone]

## 2021-10-31 NOTE — Progress Notes (Signed)
    Subjective:  Patient reports pain as mild to moderate.  Denies N/V/CP/SOB currently. She states that she did have some nausea this morning with breakfast but denies any vomiting. She states that her dizziness is improving and was better today but still present when she got up with PT. She denies dizziness when lying down. She states that they removed her catheter today. She reports that she has had some drainage coming from the area near her dressing. Overall she is improving but still having pain and swelling.   Objective:   VITALS:   Vitals:   10/31/21 1100 10/31/21 1200 10/31/21 1300 10/31/21 1400  BP: 124/67 133/64 (!) 151/62 140/62  Pulse: (!) 120 (!) 117 (!) 118 (!) 118  Resp: 17 15 13 13   Temp:  98.1 F (36.7 C)    TempSrc:  Oral    SpO2: 97% 97% 97% 94%  Weight:      Height:        Patient is lying in bed.NAD. Patient slightly pale.  Neurologically intact ABD soft Neurovascular intact Sensation intact distally Intact pulses distally Dorsiflexion/Plantar flexion intact No cellulitis present Compartment soft Patient wearing thigh high compression hose. Swelling in area where stocking end.   Incision: Fluid coming from edge of dressing. Aquacel dressing removed. Just superior to the incision along where the edge of the aquacel dressing was it appears as if a blister formed and drained. Incision is clean, dry and intact, no erythema or drainage coming from incision site. No wound dehiscence. No subcutaneous fluid collection. Aquacel dressing changed and gauze applied under edge of dressing over exposed area, ABD pad placed over gauze.   Lab Results  Component Value Date   WBC 9.0 10/31/2021   HGB 7.8 (L) 10/31/2021   HCT 23.1 (L) 10/31/2021   MCV 92.0 10/31/2021   PLT 240 10/31/2021   BMET    Component Value Date/Time   NA 142 10/31/2021 0640   K 3.3 (L) 10/31/2021 0640   CL 109 10/31/2021 0640   CO2 28 10/31/2021 0640   GLUCOSE 150 (H) 10/31/2021 0640   BUN  11 10/31/2021 0640   CREATININE 0.67 10/31/2021 0640   CALCIUM 7.6 (L) 10/31/2021 0640   GFRNONAA >60 10/31/2021 0640     Assessment/Plan:     Principal Problem:   S/P total hip arthroplasty Active Problems:   ABLA (acute blood loss anemia)  ABLA. Hemoglobin 7.8. Continue to monitor. Transfuse if hemoglobin <7.0.   Fluid around aquacel dressing edge likely from blister due to increased swelling in upper thigh. Aquacel dressing changed. Dry gauze placed under edge with ABD pad for drainage. Incision was clean, dry, and intact. No drainage or erythema. No wound dehiscence. No subcutaneous fluid collection.   WBAT with walker DVT ppx: Lovenox, SCDs, TEDS PO pain control: oxycodone PT/OT: Mild dizziness with PT today that did not worsen. She was able to walk a few steps to recliner. Continue to work with PT.  Dispo: Continue to work with physical therapy. Continue to monitor hemoglobin.   11/02/2021, PA-C 10/31/2021, 2:58 PM  Gladine Plude [facility]  [place] [address], [address], [place], [place] Phone: [phone] [email or web address] Facebook  [phone]

## 2021-10-31 NOTE — Evaluation (Signed)
Physical Therapy Evaluation Patient Details Name: Monica Martin MRN: [id number] DOB: May 03, 1951 Today's Date: 10/31/2021  History of Present Illness  71 yo F hx OA hips presented to outpt surgical center 5/19 for planned hip arthroplasty. During case had EBL about 1L. Received crystalloid in resuscitation during case and did require pressors intra-op, but was able to wean off in PACU. Direct admit to [facility] floor then to ICU due to hypotensive and required epi gtt.  Clinical Impression  The patient  was eager to get  OOB today. Patient with mild dizziness that did not worsen.  BP 136/73  HR range 116-133. RN aware. Right thigh/hip with noted edema.  Patient able to move RLE using belt to get legs over bed edge, Mod assist to sit upright, able to scoot self to bed edge in sitting.  Patient able to take  a few steps to recliner, using RW, antalgic on RLE, with PWB tolerated.  Patient plans to return home at Dc with family support.  Pt admitted with above diagnosis.  Pt currently with functional limitations due to the deficits listed below (see PT Problem List). Pt will benefit from skilled PT to increase their independence and safety with mobility to allow discharge to the venue listed below.          Recommendations for follow up therapy are one component of a multi-disciplinary discharge planning process, led by the attending physician.  Recommendations may be updated based on patient status, additional functional criteria and insurance authorization.  Follow Up Recommendations Follow physician's recommendations for discharge plan and follow up therapies    Assistance Recommended at Discharge Frequent or constant Supervision/Assistance  Patient can return home with the following  A little help with walking and/or transfers;A little help with bathing/dressing/bathroom;Help with stairs or ramp for entrance;Assistance with cooking/housework;Assist for transportation    Equipment Recommendations  None recommended by PT  Recommendations for Other Services    OT   Functional Status Assessment Patient has had a recent decline in their functional status and demonstrates the ability to make significant improvements in function in a reasonable and predictable amount of time.     Precautions / Restrictions Precautions Precautions: Fall Precaution Comments: monitor HR, BP Restrictions Weight Bearing Restrictions: Yes RLE Weight Bearing: Weight bearing as tolerated      Mobility  Bed Mobility Overal bed mobility: Needs Assistance Bed Mobility: Supine to Sit     Supine to sit: HOB elevated, Mod assist     General bed mobility comments: used belt to slide right leg to bed edge, mod assist for trunk    Transfers Overall transfer level: Needs assistance   Transfers: Sit to/from Stand Sit to Stand: Mod assist, +2 safety/equipment, From elevated surface           General transfer comment: extra tme to rise from bed, able to take small steps to recliner, decreased hip flexion to take a step.Cue to reach back to recliner    Ambulation/Gait                  Stairs            Wheelchair Mobility    Modified Rankin (Stroke Patients Only)       Balance Overall balance assessment: Needs assistance Sitting-balance support: Feet supported, Bilateral upper extremity supported Sitting balance-Leahy Scale: Good     Standing balance support: During functional activity, Bilateral upper extremity supported, Reliant on assistive device for balance Standing balance-Leahy Scale: Poor  Pertinent Vitals/Pain Pain Assessment Pain Assessment: 0-10 Pain Score: 4  Pain Descriptors / Indicators: Discomfort, Tightness Pain Intervention(s): Monitored during session, Premedicated before session    Home Living Family/patient expects to be discharged to:: Private residence Living Arrangements: Spouse/significant other Available  Help at Discharge: Family;Available 24 hours/day Type of Home: House Home Access: Stairs to enter Entrance Stairs-Rails: Right Entrance Stairs-Number of Steps: 1 curb   Home Layout: One level Home Equipment: Agricultural consultant (2 wheels)      Prior Function Prior Level of Function : Independent/Modified Independent                     Hand Dominance        Extremity/Trunk Assessment   Upper Extremity Assessment Upper Extremity Assessment: Overall WFL for tasks assessed    Lower Extremity Assessment Lower Extremity Assessment: RLE deficits/detail RLE Deficits / Details: noted thigh and hip/buttock edema    Cervical / Trunk Assessment Cervical / Trunk Assessment: Normal  Communication   Communication: No difficulties  Cognition Arousal/Alertness: Awake/alert Behavior During Therapy: WFL for tasks assessed/performed Overall Cognitive Status: Within Functional Limits for tasks assessed                                          General Comments      Exercises Total Joint Exercises Ankle Circles/Pumps: Both, 10 reps Heel Slides: Right, Left, 10 reps, Supine Long Arc Quad: AROM, Right, 10 reps, Seated   Assessment/Plan    PT Assessment Patient needs continued PT services  PT Problem List Decreased strength;Decreased range of motion;Decreased knowledge of precautions;Decreased knowledge of use of DME;Decreased activity tolerance;Pain       PT Treatment Interventions DME instruction;Therapeutic activities;Gait training;Therapeutic exercise;Patient/family education;Functional mobility training    PT Goals (Current goals can be found in the Care Plan section)  Acute Rehab PT Goals Patient Stated Goal: go home PT Goal Formulation: With patient/family Time For Goal Achievement: 11/07/21 Potential to Achieve Goals: Good    Frequency 7X/week     Co-evaluation               AM-PAC PT "6 Clicks" Mobility  Outcome Measure Help needed  turning from your back to your side while in a flat bed without using bedrails?: A Little Help needed moving from lying on your back to sitting on the side of a flat bed without using bedrails?: A Little Help needed moving to and from a bed to a chair (including a wheelchair)?: A Little Help needed standing up from a chair using your arms (e.g., wheelchair or bedside chair)?: A Lot Help needed to walk in hospital room?: Total Help needed climbing 3-5 steps with a railing? : Total 6 Click Score: 13    End of Session   Activity Tolerance: Patient limited by fatigue;Patient tolerated treatment well Patient left: in chair;with call bell/phone within reach;with family/visitor present Nurse Communication: Mobility status PT Visit Diagnosis: Unsteadiness on feet (R26.81);Muscle weakness (generalized) (M62.81);Pain Pain - Right/Left: Right Pain - part of body: Hip    Time: 1022-1050 PT Time Calculation (min) (ACUTE ONLY): 28 min   Charges:   PT Evaluation $PT Eval Low Complexity: 1 Low PT Treatments $Therapeutic Activity: 8-22 mins        Blanchard Kelch PT Acute Rehabilitation Services Pager [phone] Office [phone]   Rada Hay 10/31/2021, 11:38 AM

## 2021-11-01 ENCOUNTER — Inpatient Hospital Stay (HOSPITAL_COMMUNITY): Payer: Medicare HMO

## 2021-11-01 ENCOUNTER — Other Ambulatory Visit: Payer: Self-pay

## 2021-11-01 ENCOUNTER — Encounter (HOSPITAL_COMMUNITY): Payer: Self-pay | Admitting: Orthopedic Surgery

## 2021-11-01 DIAGNOSIS — D72829 Elevated white blood cell count, unspecified: Secondary | ICD-10-CM

## 2021-11-01 DIAGNOSIS — Z96641 Presence of right artificial hip joint: Secondary | ICD-10-CM

## 2021-11-01 DIAGNOSIS — N179 Acute kidney failure, unspecified: Secondary | ICD-10-CM

## 2021-11-01 DIAGNOSIS — R000 Tachycardia, unspecified: Secondary | ICD-10-CM

## 2021-11-01 DIAGNOSIS — E669 Obesity, unspecified: Secondary | ICD-10-CM

## 2021-11-01 DIAGNOSIS — R7303 Prediabetes: Secondary | ICD-10-CM | POA: Insufficient documentation

## 2021-11-01 DIAGNOSIS — T8119XA Other postprocedural shock, initial encounter: Secondary | ICD-10-CM

## 2021-11-01 LAB — CBC
HCT: 25.5 % — ABNORMAL LOW (ref 36.0–46.0)
Hemoglobin: 8.2 g/dL — ABNORMAL LOW (ref 12.0–15.0)
MCH: 30.3 pg (ref 26.0–34.0)
MCHC: 32.2 g/dL (ref 30.0–36.0)
MCV: 94.1 fL (ref 80.0–100.0)
Platelets: 301 10*3/uL (ref 150–400)
RBC: 2.71 MIL/uL — ABNORMAL LOW (ref 3.87–5.11)
RDW: 15.6 % — ABNORMAL HIGH (ref 11.5–15.5)
WBC: 10.3 10*3/uL (ref 4.0–10.5)
nRBC: 0.3 % — ABNORMAL HIGH (ref 0.0–0.2)

## 2021-11-01 LAB — BASIC METABOLIC PANEL
Anion gap: 8 (ref 5–15)
BUN: 12 mg/dL (ref 8–23)
CO2: 26 mmol/L (ref 22–32)
Calcium: 7.8 mg/dL — ABNORMAL LOW (ref 8.9–10.3)
Chloride: 106 mmol/L (ref 98–111)
Creatinine, Ser: 0.69 mg/dL (ref 0.44–1.00)
GFR, Estimated: >60 mL/min (ref >60)
Glucose, Bld: 153 mg/dL — ABNORMAL HIGH (ref 70–99)
Potassium: 3.4 mmol/L — ABNORMAL LOW (ref 3.5–5.1)
Sodium: 140 mmol/L (ref 135–145)

## 2021-11-01 LAB — MAGNESIUM: Magnesium: 2.1 mg/dL (ref 1.7–2.4)

## 2021-11-01 LAB — GLUCOSE, CAPILLARY
Glucose-Capillary: 137 mg/dL — ABNORMAL HIGH (ref 70–99)
Glucose-Capillary: 147 mg/dL — ABNORMAL HIGH (ref 70–99)
Glucose-Capillary: 130 mg/dL — ABNORMAL HIGH (ref 70–99)
Glucose-Capillary: 136 mg/dL — ABNORMAL HIGH (ref 70–99)

## 2021-11-01 LAB — HEMOGLOBIN AND HEMATOCRIT, BLOOD
HCT: 29.3 % — ABNORMAL LOW (ref 36.0–46.0)
Hemoglobin: 9.8 g/dL — ABNORMAL LOW (ref 12.0–15.0)

## 2021-11-01 LAB — PREPARE RBC (CROSSMATCH)

## 2021-11-01 MED ORDER — POTASSIUM CHLORIDE CRYS ER 20 MEQ PO TBCR
40.0000 meq | EXTENDED_RELEASE_TABLET | Freq: Once | ORAL | Status: AC
  Administered 2021-11-01: 40 meq via ORAL
  Filled 2021-11-01: qty 2

## 2021-11-01 MED ORDER — ACETAMINOPHEN 500 MG PO TABS
1000.0000 mg | ORAL_TABLET | Freq: Three times a day (TID) | ORAL | Status: DC
Start: 2021-11-01 — End: 2021-11-04
  Administered 2021-11-01 – 2021-11-04 (×8): 1000 mg via ORAL
  Filled 2021-11-01 (×9): qty 2

## 2021-11-01 MED ORDER — POLYETHYLENE GLYCOL 3350 17 G PO PACK
17.0000 g | PACK | Freq: Every day | ORAL | Status: DC
  Administered 2021-11-01 – 2021-11-02 (×2): 17 g via ORAL
  Filled 2021-11-01 (×4): qty 1

## 2021-11-01 MED ORDER — ACETAMINOPHEN 325 MG PO TABS: 650.0000 mg | ORAL_TABLET | Freq: Four times a day (QID) | ORAL | Status: DC | PRN

## 2021-11-01 MED ORDER — IOHEXOL 350 MG/ML SOLN
100.0000 mL | Freq: Once | INTRAVENOUS | Status: AC | PRN
  Administered 2021-11-01: 75 mL via INTRAVENOUS

## 2021-11-01 MED ORDER — OXYCODONE HCL 5 MG PO TABS
5.0000 mg | ORAL_TABLET | ORAL | Status: DC | PRN
  Administered 2021-11-01 – 2021-11-03 (×5): 5 mg via ORAL
  Filled 2021-11-01 (×6): qty 1

## 2021-11-01 MED ORDER — LACTATED RINGERS IV SOLN: INTRAVENOUS | Status: AC

## 2021-11-01 MED ORDER — OXYCODONE HCL 5 MG PO TABS
10.0000 mg | ORAL_TABLET | ORAL | Status: DC | PRN
  Administered 2021-11-01 – 2021-11-02 (×2): 10 mg via ORAL
  Filled 2021-11-01 (×2): qty 2

## 2021-11-01 MED ORDER — ASPIRIN 81 MG PO CHEW
81.0000 mg | CHEWABLE_TABLET | Freq: Two times a day (BID) | ORAL | Status: DC
  Administered 2021-11-01 – 2021-11-02 (×3): 81 mg via ORAL
  Filled 2021-11-01 (×3): qty 1

## 2021-11-01 MED ORDER — SODIUM CHLORIDE 0.9% IV SOLUTION: Freq: Once | INTRAVENOUS | Status: AC

## 2021-11-01 NOTE — Hospital Course (Signed)
71 yo F hx OA hips presented to outpt surgical center 5/19 for planned hip arthroplasty. During case had EBL about 1L. Received crystalloid in resuscitation during case and did require pressors intra-op, but was able to wean off in PACU. Direct admit to [facility] floor for obvs, however in transport, patient became hypotensive and required epi gtt.    Significant events 5/19 admit to [facility] following hip surgery due to EBL. Subsequently requiring pressors. Transfer to ICU  5/22 Off pressors, Hgb stable, s/p 4 units pRBC since admission

## 2021-11-01 NOTE — Progress Notes (Addendum)
Consult NOTE    Monica Martin  OLM:[id number] DOB: July 19, 1950 DOA: 10/28/2021 PCP: Creola Corn, MD  No chief complaint on file.   Brief Narrative:  71 yo F hx OA hips presented to outpt surgical center 5/19 for planned hip arthroplasty. During case had EBL about 1L. Received crystalloid in resuscitation during case and did require pressors intra-op, but was able to wean off in PACU. Direct admit to [facility] floor for obvs, however in transport, patient became hypotensive and required epi gtt.    Significant events 5/19 admit to [facility] following hip surgery due to EBL. Subsequently requiring pressors. Transfer to ICU  5/22 Off pressors, Hgb stable, s/p 4 units pRBC since admission    Assessment & Plan:   Principal Problem:   S/P total hip arthroplasty Active Problems:   ABLA (acute blood loss anemia)   Postoperative hemorrhagic shock   Sinus tachycardia   AKI (acute kidney injury) (HCC)   Prediabetes   Leukocytosis   Obesity (BMI 30-39.9)  Addendum - persistent inappropriate sinus tachycardia despite blood -> r/o PE with CT PE protocol.  Follow TSH, echo as well.   Assessment and Plan: * S/P total hip arthroplasty Per orthopedics Ortho is primary Has blister above surgical incision that ruptured and is covered On duricef per orthopedics  Postoperative hemorrhagic shock Transferred to [facility] after hip arthroplasty 5/19 with 1 L EBL Unknown preop Hb/Hct, will clarify with ortho (per discussion, on 09/14/21, Hb 16.7 Hct 51.2) S/p 4 units pRBC Pressors have been weaned off  BP appropriate today, Arden Axon little high Will continue to monitor    Sinus tachycardia This maybe reflective of symptomatic anemia Will discuss with pt/RN, if symptomatic when up and about, would be worth it to transfuse another unit pRBC Follow EKG Will continue to work up other causes as well as indicated, I think most likely at this point is related to hypovolemia/symptomatic anemia  Prediabetes A1c 6 SSI  AKI  (acute kidney injury) (HCC) Improved Presented with creatinine of 1.08  Leukocytosis Resolved, likely reactive at presentation On abx per ortho   Obesity (BMI 30-39.9) noted       DVT prophylaxis: aspirin, ted, scd Code Status: full Family Communication: husband at bedside Dispo: orthopedics is primary   Consultants:  [facility] is consulting PCCM Orthopedics is primary  Procedures:  Central line 5/19 R total hip arthroplasty 5/19  Antimicrobials:  Anti-infectives (From admission, onward)    Start     Dose/Rate Route Frequency Ordered Stop   10/30/21 1445  cefadroxil (DURICEF) capsule 1,000 mg        1,000 mg Oral 2 times daily 10/30/21 1423 11/04/21 0959       Subjective: Doesn't feel well Just not feeling much better  Objective: Vitals:   11/01/21 0400 11/01/21 0500 11/01/21 0700 11/01/21 0730  BP: 126/75 (!) 141/64 (!) 158/76   Pulse: (!) 112 (!) 108 (!) 112 (!) 119  Resp: 16 15 20 20   Temp: 100.2 F (37.9 C)     TempSrc: Axillary     SpO2: 93% 98% 93% 97%  Weight:      Height:        Intake/Output Summary (Last 24 hours) at 11/01/2021 0919 Last data filed at 11/01/2021 0730 Gross per 24 hour  Intake 91.27 ml  Output 800 ml  Net -708.73 ml   Filed Weights   10/29/21 1200  Weight: 99.8 kg    Examination:  General exam: Appears calm and comfortable  Respiratory system: unlabored  Cardiovascular system: RRR. Gastrointestinal system: Abdomen is nondistended, soft and nontender Central nervous system: Alert and oriented. No focal neurological deficits. Extremities: RLE swelling, ruptured blistered with exposed dermis, pink   Data Reviewed: I have personally reviewed following labs and imaging studies  CBC: Recent Labs  Lab 10/29/21 0315 10/29/21 2155 10/30/21 0645 10/30/21 1322 10/31/21 0640 11/01/21 0610  WBC 17.8*  --  8.7 9.3 9.0 10.3  HGB 7.6* 7.0* 6.6* 8.0* 7.8* 8.2*  HCT 22.6* 20.5* 19.8* 23.8* 23.1* 25.5*  MCV 93.8  --  93.0  90.8 92.0 94.1  PLT 297  --  246 254 240 301    Basic Metabolic Panel: Recent Labs  Lab 10/29/21 0315 10/30/21 0645 10/31/21 0640 10/31/21 0758 11/01/21 0610  NA 140 143 142  --  140  K 4.3 3.6 3.3*  --  3.4*  CL 114* 112* 109  --  106  CO2 20* 26 28  --  26  GLUCOSE 300* 181* 150*  --  153*  BUN 19 13 11   --  12  CREATININE 1.08* 0.75 0.67  --  0.69  CALCIUM 7.3* 7.6* 7.6*  --  7.8*  MG  --   --   --  2.0 2.1    GFR: Estimated Creatinine Clearance: 79.1 mL/min (by C-G formula based on SCr of 0.69 mg/dL).  Liver Function Tests: Recent Labs  Lab 10/29/21 0315 10/31/21 0640  AST 34 37  ALT 17 22  ALKPHOS 30* 46  BILITOT 0.9 0.9  PROT 4.1* 4.4*  ALBUMIN 2.8* 2.4*    CBG: Recent Labs  Lab 10/31/21 0733 10/31/21 1200 10/31/21 1701 10/31/21 2133 11/01/21 0731  GLUCAP 140* 141* 111* 150* 137*     Recent Results (from the past 240 hour(s))  MRSA Next Gen by PCR, Nasal     Status: None   Collection Time: 10/28/21  6:25 PM   Specimen: Nasal Mucosa; Nasal Swab  Result Value Ref Range Status   MRSA by PCR Next Gen NOT DETECTED NOT DETECTED Final    Comment: (NOTE) The GeneXpert MRSA Assay (FDA approved for NASAL specimens only), is one component of Tharun Cappella comprehensive MRSA colonization surveillance program. It is not intended to diagnose MRSA infection nor to guide or monitor treatment for MRSA infections. Test performance is not FDA approved in patients less than 58 years old. Performed at [facility], [address], [place], [place]          Radiology Studies: No results found.      Scheduled Meds:  aspirin  81 mg Oral BID WC   cefadroxil  1,000 mg Oral BID   Chlorhexidine Gluconate Cloth  6 each Topical Daily   insulin aspart  0-9 Units Subcutaneous TID WC   sodium chloride flush  10-40 mL Intracatheter Q12H   Continuous Infusions:  sodium chloride Stopped (10/31/21 1216)   lactated ringers     sodium chloride        LOS: 3 days    Time spent: over 30 min    11/02/21, MD [facility]   To contact the attending provider between 7A-7P or the covering provider during after hours 7P-7A, please log into the web site [email or web address] and access using universal [place] password for that web site. If you do not have the password, please call the hospital operator.  11/01/2021, 9:19 AM

## 2021-11-01 NOTE — Evaluation (Signed)
Occupational Therapy Evaluation Patient Details Name: Monica BreamMargie T Martin MRN: [id number] DOB: 12/14/1950 Today's Date: 11/01/2021   History of Present Illness 71 yo F hx OA hips presented to outpt surgical center 5/19 for planned hip arthroplasty. During case had EBL about 1L. Received crystalloid in resuscitation during case and did require pressors intra-op, but was able to wean off in PACU. [facility] then to ICU due to hypotensive and required epi gtt.   Clinical Impression   Patient is a 71 year old female who was admitted for above. Patients session was limited by HR reaching 140 bpm and pain in RLE. Patient was noted to have max A for LB dressing/bathing tasks and increased physical assistance for ADLs. Patient was noted to have decreased functional activity tolerance, decreased endurance, decreased standing balance and decreased safety awareness.       Recommendations for follow up therapy are one component of a multi-disciplinary discharge planning process, led by the attending physician.  Recommendations may be updated based on patient status, additional functional criteria and insurance authorization.   Follow Up Recommendations  Follow physician's recommendations for discharge plan and follow up therapies    Assistance Recommended at Discharge Frequent or constant Supervision/Assistance  Patient can return home with the following A lot of help with walking and/or transfers;A lot of help with bathing/dressing/bathroom;Assistance with cooking/housework;Direct supervision/assist for financial management;Assist for transportation;Help with stairs or ramp for entrance;Direct supervision/assist for medications management    Functional Status Assessment  Patient has had a recent decline in their functional status and demonstrates the ability to make significant improvements in function in a reasonable and predictable amount of time.  Equipment Recommendations  Tub/shower bench     Recommendations for Other Services       Precautions / Restrictions Precautions Precautions: Fall Precaution Comments: monitor HR, BP Restrictions Weight Bearing Restrictions: No RLE Weight Bearing: Weight bearing as tolerated      Mobility Bed Mobility               General bed mobility comments: patient was out of bed and remained in recliner    Transfers                          Balance Overall balance assessment: Needs assistance Sitting-balance support: Feet supported, Bilateral upper extremity supported Sitting balance-Leahy Scale: Good     Standing balance support: During functional activity, Bilateral upper extremity supported, Reliant on assistive device for balance Standing balance-Leahy Scale: Poor                             ADL either performed or assessed with clinical judgement   ADL Overall ADL's : Needs assistance/impaired Eating/Feeding: Set up;Sitting   Grooming: Oral care;Standing Grooming Details (indicate cue type and reason): patients HR was noted to increase to 140 bpm with task. patient returned to sitting at this time. Upper Body Bathing: Minimal assistance;Sitting   Lower Body Bathing: Maximal assistance;Sit to/from stand   Upper Body Dressing : Minimal assistance;Sitting   Lower Body Dressing: Maximal assistance;Sit to/from stand   Toilet Transfer: Moderate assistance;Rolling walker (2 wheels) Toilet Transfer Details (indicate cue type and reason): patient was able to transition from sit to stand at recliner with mod A with increased time with RW. patient returned to recliner to sit up for lunch at this time Toileting- Clothing Manipulation and Hygiene: Maximal assistance;Sit to/from stand  Vision Patient Visual Report: No change from baseline       Perception     Praxis      Pertinent Vitals/Pain Pain Assessment Pain Assessment: Faces Faces Pain Scale: Hurts even more Pain  Location: R thigh Pain Descriptors / Indicators: Discomfort, Tightness Pain Intervention(s): Limited activity within patient's tolerance, Monitored during session, Repositioned, Premedicated before session     Hand Dominance Right   Extremity/Trunk Assessment Upper Extremity Assessment Upper Extremity Assessment: RUE deficits/detail;LUE deficits/detail RUE Deficits / Details: FF to about 90 degrees with increased time. h/o pain in shoulder at night and with movement. reports she thinks she has RA in it lik her hip but has not been tested LUE Deficits / Details: FF to about 90 degrees with increased time.   Lower Extremity Assessment Lower Extremity Assessment: Defer to PT evaluation   Cervical / Trunk Assessment Cervical / Trunk Assessment: Normal   Communication Communication Communication: No difficulties   Cognition Arousal/Alertness: Awake/alert Behavior During Therapy: WFL for tasks assessed/performed Overall Cognitive Status: Within Functional Limits for tasks assessed                                       General Comments       Exercises     Shoulder Instructions      Home Living Family/patient expects to be discharged to:: Private residence Living Arrangements: Spouse/significant other Available Help at Discharge: Family;Available 24 hours/day Type of Home: House Home Access: Stairs to enter [employer] of Steps: 1 curb Entrance Stairs-Rails: Right Home Layout: One level     Bathroom Shower/Tub: Tub only (garden tub)         Home Equipment: Agricultural consultant (2 wheels)   Additional Comments: shower stool      Prior Functioning/Environment Prior Level of Function : Independent/Modified Independent               ADLs Comments: has two cats and one large dog at home        OT Problem List: Decreased activity tolerance;Impaired balance (sitting and/or standing);Decreased safety awareness;Decreased knowledge of  precautions;Decreased knowledge of use of DME or AE;Cardiopulmonary status limiting activity;Pain;Impaired UE functional use      OT Treatment/Interventions: Self-care/ADL training;Therapeutic exercise;Neuromuscular education;Energy conservation;DME and/or AE instruction;Therapeutic activities;Balance training;Patient/family education    OT Goals(Current goals can be found in the care plan section) Acute Rehab OT Goals Patient Stated Goal: to get home to dogs OT Goal Formulation: With patient Time For Goal Achievement: 11/15/21 Potential to Achieve Goals: Good  OT Frequency: Min 2X/week    Co-evaluation              AM-PAC OT "6 Clicks" Daily Activity     Outcome Measure Help from another person eating meals?: A Little Help from another person taking care of personal grooming?: A Little Help from another person toileting, which includes using toliet, bedpan, or urinal?: A Lot Help from another person bathing (including washing, rinsing, drying)?: A Lot Help from another person to put on and taking off regular upper body clothing?: A Lot Help from another person to put on and taking off regular lower body clothing?: A Lot 6 Click Score: 14   End of Session Equipment Utilized During Treatment: Gait belt;Rolling walker (2 wheels) Nurse Communication: Mobility status  Activity Tolerance: Patient tolerated treatment well Patient left: in chair;with call bell/phone within reach  OT Visit Diagnosis: Unsteadiness  on feet (R26.81);Muscle weakness (generalized) (M62.81);Pain                Time: 1150-1205 OT Time Calculation (min): 15 min Charges:  OT General Charges $OT Visit: 1 Visit OT Evaluation $OT Eval Moderate Complexity: 1 Mod  Sharyn Blitz OTR/L, MS Acute Rehabilitation Department Office# [phone] Pager# [phone]   Ardyth Harps 11/01/2021, 12:44 PM

## 2021-11-01 NOTE — Assessment & Plan Note (Signed)
Resolved, likely reactive at presentation On abx per ortho

## 2021-11-01 NOTE — Assessment & Plan Note (Addendum)
A1c 6 SSI

## 2021-11-01 NOTE — Assessment & Plan Note (Signed)
Improved Presented with creatinine of 1.08

## 2021-11-01 NOTE — Assessment & Plan Note (Addendum)
This maybe reflective of symptomatic anemia Will discuss with pt/RN, if symptomatic when up and about, would be worth it to transfuse another unit pRBC Follow EKG Will continue to work up other causes as well as indicated, I think most likely at this point is related to hypovolemia/symptomatic anemia

## 2021-11-01 NOTE — Progress Notes (Signed)
    Subjective:  Patient reports pain as mild to moderate.  Denies N/V/CP/SOB. Patient was able to get up and use bedside commode today. Denied dizziness. Patient states she is feeling depressed lying in the bed.   Objective:   VITALS:   Vitals:   11/01/21 0400 11/01/21 0500 11/01/21 0700 11/01/21 0730  BP: 126/75 (!) 141/64 (!) 158/76   Pulse: (!) 112 (!) 108 (!) 112 (!) 119  Resp: 16 15 20 20   Temp: 100.2 F (37.9 C)     TempSrc: Axillary     SpO2: 93% 98% 93% 97%  Weight:      Height:        Patient lying in bed with partner at bedside. NAD Neurologically intact ABD soft Neurovascular intact Sensation intact distally Intact pulses distally Dorsiflexion/Plantar flexion intact No cellulitis present Compartment soft Dressing: Incision C/D/I. Changed dressing around Aquacel dressing. Just superior to the incision along where the edge of the aquacel dressing was it appears as if a blister formed and drained. Gauze applied under edge of dressing over exposed area, ABD pad placed over gauze.    Recent Labs    Lab Results  Component Value Date   WBC 10.3 11/01/2021   HGB 8.2 (L) 11/01/2021   HCT 25.5 (L) 11/01/2021   MCV 94.1 11/01/2021   PLT 301 11/01/2021   BMET    Component Value Date/Time   NA 140 11/01/2021 0610   K 3.4 (L) 11/01/2021 0610   CL 106 11/01/2021 0610   CO2 26 11/01/2021 0610   GLUCOSE 153 (H) 11/01/2021 0610   BUN 12 11/01/2021 0610   CREATININE 0.69 11/01/2021 0610   CALCIUM 7.8 (L) 11/01/2021 0610   GFRNONAA >60 11/01/2021 0610     Assessment/Plan:     Principal Problem:   S/P total hip arthroplasty Active Problems:   ABLA (acute blood loss anemia)  ABLA. Hemoglobin 8.2 today (7.8 yesterday). Continue to monitor. Transfuse if hemoglobin <7.0. Symptoms improving.  Hypokalemia: Patients potassium 3.4 today. She was given 11/03/2021 of kdur yesterday. scheduled for today as well.  Patient to ice area for swelling and pain.   Spoke  with patient about isometric quad exercises to do while lying in bed. PT to come by today and work with patient.   WBAT with walker DVT ppx: Aspirin, SCDs, TEDS PO pain control: Pain controlled.  PT/OT: PT hasn't seen since note yesterday. PT to continue to work with patient.  Dispo: Continue to work with physical therapy. Continue to monitor hemoglobin and potassium.     , PA-C 11/01/2021, 8:18 AM  [facility]  [place] [address], [place], [place] Phone: [phone] [email or web address] Facebook  [phone]

## 2021-11-01 NOTE — Assessment & Plan Note (Addendum)
Per orthopedics Ortho is primary Has blister above surgical incision that ruptured and is covered On duricef per orthopedics Complains of burning pain round blisters, will try lidocaine patch

## 2021-11-01 NOTE — Assessment & Plan Note (Signed)
noted 

## 2021-11-01 NOTE — Progress Notes (Signed)
Physical Therapy Treatment Patient Details Name: Monica Martin MRN: [id number] DOB: 01/31/1951 Today's Date: 11/01/2021   History of Present Illness 71 yo F hx OA hips presented to outpt surgical center 5/19 for planned hip arthroplasty. During case had EBL about 1L. Received crystalloid in resuscitation during case and did require pressors intra-op, but was able to wean off in PACU. Direct admit to [facility] floor then to ICU due to hypotensive and required epi gtt.    PT Comments    Progressing slowly with mobility. Limited by increased HR, general weakness and fatigue. Moderate pain with activity but pt able to participate. HR: resting 116 bpm, with activity 145 bpm. O2 95% on RA, dyspnea 3/4. Encouraged pt to try to work with therapy daily in addition to getting up with nursing. Will continue to follow and progress activity as tolerated.     Recommendations for follow up therapy are one component of a multi-disciplinary discharge planning process, led by the attending physician.  Recommendations may be updated based on patient status, additional functional criteria and insurance authorization.  Follow Up Recommendations  Follow physician's recommendations for discharge plan and follow up therapies     Assistance Recommended at Discharge Frequent or constant Supervision/Assistance  Patient can return home with the following A little help with walking and/or transfers;A little help with bathing/dressing/bathroom;Help with stairs or ramp for entrance;Assistance with cooking/housework;Assist for transportation   Equipment Recommendations  None recommended by PT    Recommendations for Other Services       Precautions / Restrictions Precautions Precautions: Fall Precaution Comments: monitor HR, BP Restrictions Weight Bearing Restrictions: No RLE Weight Bearing: Weight bearing as tolerated     Mobility  Bed Mobility Overal bed mobility: Needs Assistance Bed Mobility: Supine to Sit      Supine to sit: Mod assist, HOB elevated     General bed mobility comments: used belt to slide right leg to bed edge, mod assist for trunk. increased time. cues provided    Transfers Overall transfer level: Needs assistance Equipment used: Rolling walker (2 wheels)   Sit to Stand: Min assist, +2 safety/equipment, From elevated surface           General transfer comment: Assist to power up, steady, control descent. Cues for safety, hand placement.    Ambulation/Gait Ambulation/Gait assistance: Min assist, +2 safety/equipment Gait Distance (Feet): 25 Feet Assistive device: Rolling walker (2 wheels) Gait Pattern/deviations: Step-to pattern, Trunk flexed       General Gait Details: Cues for safety, posture. HR up to 145 bpm, O2 95% on RA, dyspnea 3/4. Followed closely with recliner and used to transport back to room   Stairs             Wheelchair Mobility    Modified Rankin (Stroke Patients Only)       Balance Overall balance assessment: Needs assistance         Standing balance support: During functional activity, Bilateral upper extremity supported, Reliant on assistive device for balance Standing balance-Leahy Scale: Poor                              Cognition Arousal/Alertness: Awake/alert Behavior During Therapy: WFL for tasks assessed/performed Overall Cognitive Status: Within Functional Limits for tasks assessed  Exercises Total Joint Exercises Ankle Circles/Pumps: AROM, Both, 10 reps Quad Sets: AROM, Both, 10 reps Heel Slides: AAROM, Right, 5 reps Hip ABduction/ADduction: AAROM, Right, 5 reps    General Comments        Pertinent Vitals/Pain Pain Assessment Pain Assessment: Faces Faces Pain Scale: Hurts even more Pain Location: R thigh Pain Descriptors / Indicators: Discomfort, Tightness Pain Intervention(s): Limited activity within patient's tolerance, Monitored  during session, Repositioned, Ice applied    Home Living                          Prior Function            PT Goals (current goals can now be found in the care plan section) Progress towards PT goals: Progressing toward goals    Frequency    7X/week      PT Plan Current plan remains appropriate    Co-evaluation              AM-PAC PT "6 Clicks" Mobility   Outcome Measure  Help needed turning from your back to your side while in a flat bed without using bedrails?: A Little Help needed moving from lying on your back to sitting on the side of a flat bed without using bedrails?: A Little Help needed moving to and from a bed to a chair (including a wheelchair)?: A Little Help needed standing up from a chair using your arms (e.g., wheelchair or bedside chair)?: A Little Help needed to walk in hospital room?: A Lot Help needed climbing 3-5 steps with a railing? : A Lot 6 Click Score: 16    End of Session Equipment Utilized During Treatment: Gait belt Activity Tolerance: Patient limited by fatigue Patient left: in chair;with call bell/phone within reach   PT Visit Diagnosis: Unsteadiness on feet (R26.81);Muscle weakness (generalized) (M62.81);Pain Pain - Right/Left: Right Pain - part of body: Hip     Time: 1050-1116 PT Time Calculation (min) (ACUTE ONLY): 26 min  Charges:  $Gait Training: 8-22 mins $Therapeutic Exercise: 8-22 mins                        Faye Ramsay, PT Acute Rehabilitation  Office: [phone] Pager: [phone]

## 2021-11-01 NOTE — Assessment & Plan Note (Signed)
Transferred to [facility] after hip arthroplasty 5/19 with 1 L EBL Unknown preop Hb/Hct, will clarify with ortho (per discussion, on 09/14/21, Hb 16.7 Hct 51.2) S/p 4 units pRBC Pressors have been weaned off  BP appropriate today, [place] Haislip little high Will continue to monitor

## 2021-11-02 ENCOUNTER — Encounter (HOSPITAL_COMMUNITY): Payer: Self-pay | Admitting: Orthopedic Surgery

## 2021-11-02 ENCOUNTER — Inpatient Hospital Stay (HOSPITAL_COMMUNITY): Payer: Medicare HMO

## 2021-11-02 DIAGNOSIS — R008 Other abnormalities of heart beat: Secondary | ICD-10-CM

## 2021-11-02 DIAGNOSIS — E278 Other specified disorders of adrenal gland: Secondary | ICD-10-CM | POA: Diagnosis not present

## 2021-11-02 DIAGNOSIS — N179 Acute kidney failure, unspecified: Secondary | ICD-10-CM

## 2021-11-02 DIAGNOSIS — Z96641 Presence of right artificial hip joint: Secondary | ICD-10-CM | POA: Diagnosis not present

## 2021-11-02 DIAGNOSIS — D62 Acute posthemorrhagic anemia: Secondary | ICD-10-CM | POA: Diagnosis not present

## 2021-11-02 DIAGNOSIS — E8809 Other disorders of plasma-protein metabolism, not elsewhere classified: Secondary | ICD-10-CM

## 2021-11-02 LAB — ECHOCARDIOGRAM COMPLETE
Area-P 1/2: 5.23 cm2
Calc EF: 66.8 %
Height: 66 in
S' Lateral: 2.7 cm
Single Plane A2C EF: 66.7 %
Single Plane A4C EF: 65.8 %
Weight: 3520.31 oz

## 2021-11-02 LAB — COMPREHENSIVE METABOLIC PANEL
ALT: 21 U/L (ref 0–44)
AST: 18 U/L (ref 15–41)
Albumin: 2.3 g/dL — ABNORMAL LOW (ref 3.5–5.0)
Alkaline Phosphatase: 51 U/L (ref 38–126)
Anion gap: 5 (ref 5–15)
BUN: 15 mg/dL (ref 8–23)
CO2: 27 mmol/L (ref 22–32)
Calcium: 7.7 mg/dL — ABNORMAL LOW (ref 8.9–10.3)
Chloride: 107 mmol/L (ref 98–111)
Creatinine, Ser: 0.68 mg/dL (ref 0.44–1.00)
GFR, Estimated: >60 mL/min (ref >60)
Glucose, Bld: 137 mg/dL — ABNORMAL HIGH (ref 70–99)
Potassium: 3.8 mmol/L (ref 3.5–5.1)
Sodium: 139 mmol/L (ref 135–145)
Total Bilirubin: 1.2 mg/dL (ref 0.3–1.2)
Total Protein: 4.6 g/dL — ABNORMAL LOW (ref 6.5–8.1)

## 2021-11-02 LAB — TYPE AND SCREEN
ABO/RH(D): A POS
Antibody Screen: NEGATIVE
Unit division: 0

## 2021-11-02 LAB — CBC WITH DIFFERENTIAL/PLATELET
Abs Immature Granulocytes: 0.17 10*3/uL — ABNORMAL HIGH (ref 0.00–0.07)
Basophils Absolute: 0.1 10*3/uL (ref 0.0–0.1)
Basophils Relative: 1 %
Eosinophils Absolute: 0.2 10*3/uL (ref 0.0–0.5)
Eosinophils Relative: 2 %
HCT: 26.3 % — ABNORMAL LOW (ref 36.0–46.0)
Hemoglobin: 8.7 g/dL — ABNORMAL LOW (ref 12.0–15.0)
Immature Granulocytes: 2 %
Lymphocytes Relative: 16 %
Lymphs Abs: 1.6 10*3/uL (ref 0.7–4.0)
MCH: 30.9 pg (ref 26.0–34.0)
MCHC: 33.1 g/dL (ref 30.0–36.0)
MCV: 93.3 fL (ref 80.0–100.0)
Monocytes Absolute: 1 10*3/uL (ref 0.1–1.0)
Monocytes Relative: 9 %
Neutro Abs: 7.3 10*3/uL (ref 1.7–7.7)
Neutrophils Relative %: 70 %
Platelets: 306 10*3/uL (ref 150–400)
RBC: 2.82 MIL/uL — ABNORMAL LOW (ref 3.87–5.11)
RDW: 15.3 % (ref 11.5–15.5)
WBC: 10.4 10*3/uL (ref 4.0–10.5)
nRBC: 0.6 % — ABNORMAL HIGH (ref 0.0–0.2)

## 2021-11-02 LAB — GLUCOSE, CAPILLARY
Glucose-Capillary: 147 mg/dL — ABNORMAL HIGH (ref 70–99)
Glucose-Capillary: 122 mg/dL — ABNORMAL HIGH (ref 70–99)
Glucose-Capillary: 121 mg/dL — ABNORMAL HIGH (ref 70–99)
Glucose-Capillary: 150 mg/dL — ABNORMAL HIGH (ref 70–99)

## 2021-11-02 LAB — TSH: TSH: 2.548 u[IU]/mL (ref 0.350–4.500)

## 2021-11-02 LAB — MAGNESIUM: Magnesium: 2 mg/dL (ref 1.7–2.4)

## 2021-11-02 LAB — BPAM RBC
Blood Product Expiration Date: 202306162359
ISSUE DATE / TIME: 202305231254
Unit Type and Rh: 6200

## 2021-11-02 LAB — PHOSPHORUS: Phosphorus: 4.2 mg/dL (ref 2.5–4.6)

## 2021-11-02 MED ORDER — ALBUMIN HUMAN 25 % IV SOLN
50.0000 g | Freq: Once | INTRAVENOUS | Status: AC
  Administered 2021-11-02: 50 g via INTRAVENOUS
  Filled 2021-11-02: qty 200

## 2021-11-02 MED ORDER — LIDOCAINE 5 % EX PTCH
1.0000 | MEDICATED_PATCH | CUTANEOUS | Status: DC
  Administered 2021-11-02 – 2021-11-03 (×2): 1 via TRANSDERMAL
  Filled 2021-11-02 (×3): qty 1

## 2021-11-02 MED ORDER — APIXABAN 5 MG PO TABS
5.0000 mg | ORAL_TABLET | Freq: Two times a day (BID) | ORAL | Status: DC
  Administered 2021-11-02 – 2021-11-04 (×4): 5 mg via ORAL
  Filled 2021-11-02 (×4): qty 1

## 2021-11-02 NOTE — Assessment & Plan Note (Addendum)
Likely contributing to swelling and appearance of thickened gall bladder on CT (she is otherwise asymptomatic) Will give one dose of albumin since it may help with volume management

## 2021-11-02 NOTE — Progress Notes (Signed)
Informed Dr. Roderic Palau of a call from radiology asking for the results of the patient's previous CT to be reevaluated.

## 2021-11-02 NOTE — Progress Notes (Signed)
  Echocardiogram 2D Echocardiogram has been performed.  Monica Martin 11/02/2021, 1:33 PM

## 2021-11-02 NOTE — Progress Notes (Signed)
Occupational Therapy Treatment Patient Details Name: Monica Martin MRN: [id number] DOB: [birth year] Today's Date: 11/02/2021   History of present illness 71 yo F hx OA hips presented to outpt surgical center 5/19 for planned hip arthroplasty. During case had EBL about 1L. Received crystalloid in resuscitation during case and did require pressors intra-op, but was able to wean off in PACU. Direct admit to [facility] floor then to ICU due to hypotensive and required epi gtt.   OT comments  Patient was educated on AE for LB dressing with min A for donning/doffing socks with AE. Patient and husband were educated  on AE for LB dressing total hip kit. Patient and husband to look into purchasing one in next level of care. Patient's discharge plan remains appropriate at this time. OT will continue to follow acutely.     Recommendations for follow up therapy are one component of a multi-disciplinary discharge planning process, led by the attending physician.  Recommendations may be updated based on patient status, additional functional criteria and insurance authorization.    Follow Up Recommendations  Follow physician's recommendations for discharge plan and follow up therapies    Assistance Recommended at Discharge Frequent or constant Supervision/Assistance  Patient can return home with the following  A lot of help with walking and/or transfers;A lot of help with bathing/dressing/bathroom;Assistance with cooking/housework;Direct supervision/assist for financial management;Assist for transportation;Help with stairs or ramp for entrance;Direct supervision/assist for medications management   Equipment Recommendations  Tub/shower bench    Recommendations for Other Services      Precautions / Restrictions Precautions Precautions: Fall Precaution Comments: monitor HR Restrictions Weight Bearing Restrictions: No RLE Weight Bearing: Weight bearing as tolerated       Mobility Bed Mobility   Bed  Mobility: Sit to Supine       Sit to supine: Min assist   General bed mobility comments: with use of gait belt as leg lifter with increased time.    Transfers                         Balance                                           ADL either performed or assessed with clinical judgement   ADL Overall ADL's : Needs assistance/impaired         Upper Body Bathing: Set up;Sitting   Lower Body Bathing: Moderate assistance;Sitting/lateral leans Lower Body Bathing Details (indicate cue type and reason): in recliner Upper Body Dressing : Sitting;Set up     Lower Body Dressing Details (indicate cue type and reason): patient was educated on how to don socks with AE with min A to complete task. patietn was educated on total hip kit. patient verbalized understanding. patients husband verbalized understanding as well.   Toilet Transfer Details (indicate cue type and reason): patient was able to transfer from recliner in room to bed with mod A with increased time and cues for proper hand and foot positioning with RW                Extremity/Trunk Assessment              Vision       Perception     Praxis      Cognition Arousal/Alertness: Awake/alert Behavior During Therapy: WFL for tasks assessed/performed Overall Cognitive Status:  Within Functional Limits for tasks assessed                                          Exercises      Shoulder Instructions       General Comments      Pertinent Vitals/ Pain       Pain Assessment Pain Assessment: Faces Faces Pain Scale: Hurts even more Pain Location: R thigh Pain Descriptors / Indicators: Discomfort, Tightness, Burning Pain Intervention(s): Limited activity within patient's tolerance, Monitored during session, Repositioned, Ice applied  Home Living                                          Prior Functioning/Environment               Frequency  Min 2X/week        Progress Toward Goals  OT Goals(current goals can now be found in the care plan section)  Progress towards OT goals: Progressing toward goals     Plan Discharge plan remains appropriate    Co-evaluation                 AM-PAC OT "6 Clicks" Daily Activity     Outcome Measure   Help from another person eating meals?: A Little Help from another person taking care of personal grooming?: A Little Help from another person toileting, which includes using toliet, bedpan, or urinal?: A Lot Help from another person bathing (including washing, rinsing, drying)?: A Lot Help from another person to put on and taking off regular upper body clothing?: A Lot Help from another person to put on and taking off regular lower body clothing?: A Lot 6 Click Score: 14    End of Session Equipment Utilized During Treatment: Gait belt;Rolling walker (2 wheels)  OT Visit Diagnosis: Unsteadiness on feet (R26.81);Muscle weakness (generalized) (M62.81);Pain   Activity Tolerance Patient tolerated treatment well   Patient Left in chair;with call bell/phone within reach   Nurse Communication Mobility status        Time: 1110-1140 OT Time Calculation (min): 30 min  Charges: OT General Charges $OT Visit: 1 Visit OT Treatments $Self Care/Home Management : 23-37 mins  Jackelyn Poling OTR/L, MS Acute Rehabilitation Department Office# [phone] Pager# [phone]   Marcellina Millin 11/02/2021, 12:53 PM

## 2021-11-02 NOTE — TOC Initial Note (Signed)
Transition of Care [facility]) - Initial/Assessment Note    Patient Details  Name: Monica Martin MRN: [id number] Date of Birth: 08/03/1950  Transition of Care [facility]) CM/SW Contact:    Lanier Clam, RN Phone Number: 11/02/2021, 11:44 AM  Clinical Narrative:From  home. Ortho bundle orth CM following-rw has already been orderd-in spouse trunk for d/c. No further CM needs.                   Expected Discharge Plan: Home/Self Care Barriers to Discharge: No Barriers Identified   Patient Goals and CMS Choice Patient states their goals for this hospitalization and ongoing recovery are:: Home CMS Medicare.gov Compare Post Acute Care list provided to:: Patient    Expected Discharge Plan and Services Expected Discharge Plan: Home/Self Care   Discharge Planning Services: CM Consult (Ortho bundle-CM through ortho  office) Post Acute Care Choice: Durable Medical Equipment                                        Prior Living Arrangements/Services     Patient language and need for interpreter reviewed:: Yes Do you feel safe going back to the place where you live?: Yes      Need for Family Participation in Patient Care: Yes (Comment) Care giver support system in place?: Yes (comment)      Activities of Daily Living Home Assistive Devices/Equipment: None ADL Screening (condition at time of admission) Patient's cognitive ability adequate to safely complete daily activities?: Yes Is the patient deaf or have difficulty hearing?: No Does the patient have difficulty seeing, even when wearing glasses/contacts?: No Does the patient have difficulty concentrating, remembering, or making decisions?: No Patient able to express need for assistance with ADLs?: Yes Does the patient have difficulty dressing or bathing?: Yes Independently performs ADLs?: No Communication: Independent Dressing (OT): Needs assistance Is this a change from baseline?: Change from baseline, expected to last  <3days Grooming: Needs assistance Is this a change from baseline?: Change from baseline, expected to last <3 days Feeding: Needs assistance Is this a change from baseline?: Change from baseline, expected to last <3 days Bathing: Needs assistance Is this a change from baseline?: Change from baseline, expected to last <3 days Toileting: Needs assistance Is this a change from baseline?: Change from baseline, expected to last <3 days In/Out Bed: Needs assistance Is this a change from baseline?: Change from baseline, expected to last <3 days Walks in Home: Needs assistance Is this a change from baseline?: Change from baseline, expected to last <3 days Does the patient have difficulty walking or climbing stairs?: Yes Weakness of Legs: Both Weakness of Arms/Hands: Both  Permission Sought/Granted Permission sought to share information with : Case Manager Permission granted to share information with : Yes, Verbal Permission Granted  Share Information with NAME: Case manager           Emotional Assessment Appearance:: Appears stated age   Affect (typically observed): Accepting Orientation: : Oriented to Self, Oriented to Place, Oriented to  Time, Oriented to Situation   Psych Involvement: No (comment)  Admission diagnosis:  S/P total hip arthroplasty [Z96.649] Patient Active Problem List   Diagnosis Date Noted   Adrenal nodule (HCC) 11/02/2021   Hypoalbuminemia 11/02/2021   Postoperative hemorrhagic shock 11/01/2021   AKI (acute kidney injury) (HCC) 11/01/2021   Leukocytosis 11/01/2021   Prediabetes 11/01/2021   Sinus tachycardia 11/01/2021  Obesity (BMI 30-39.9) 11/01/2021   S/P total hip arthroplasty 10/29/2021   ABLA (acute blood loss anemia)    PCP:  Creola Corn, MD Pharmacy:   [facility] [address] [place] - [address] [place] [address] Phone: [phone] Fax: [phone]     Social Determinants of  Health (SDOH) Interventions    Readmission Risk Interventions     View : No data to display.

## 2021-11-02 NOTE — Progress Notes (Signed)
Physical Therapy Treatment Patient Details Name: Monica Martin MRN: [id number] DOB: August 24, 1950 Today's Date: 11/02/2021   History of Present Illness 71 yo F hx OA hips presented to outpt surgical center 5/19 for planned hip arthroplasty. During case had EBL about 1L. Received crystalloid in resuscitation during case and did require pressors intra-op, but was able to wean off in PACU. Direct admit to [facility] floor then to ICU due to hypotensive and required epi gtt.    PT Comments    Progressing with mobility. HR up to 133 bpm during session on today. Pt tolerated activity fairly well. Moderate pain with activity. Will continue to progress activity as tolerated.    Recommendations for follow up therapy are one component of a multi-disciplinary discharge planning process, led by the attending physician.  Recommendations may be updated based on patient status, additional functional criteria and insurance authorization.  Follow Up Recommendations  Follow physician's recommendations for discharge plan and follow up therapies     Assistance Recommended at Discharge Frequent or constant Supervision/Assistance  Patient can return home with the following A little help with walking and/or transfers;A little help with bathing/dressing/bathroom;Help with stairs or ramp for entrance;Assistance with cooking/housework;Assist for transportation   Equipment Recommendations  None recommended by PT    Recommendations for Other Services       Precautions / Restrictions Precautions Precautions: Fall Precaution Comments: monitor HR Restrictions Weight Bearing Restrictions: No RLE Weight Bearing: Weight bearing as tolerated     Mobility  Bed Mobility Overal bed mobility: Needs Assistance Bed Mobility: Supine to Sit     Supine to sit: Mod assist, HOB elevated     General bed mobility comments: Increased time and effort for pt. Some assistance required for trunk still. Pt continue to use gait belt as  leg lifer for R LE>    Transfers Overall transfer level: Needs assistance Equipment used: Rolling walker (2 wheels) Transfers: Sit to/from Stand Sit to Stand: Min assist           General transfer comment: Assist to power up, steady, control descent. Cues for safety, hand placement.    Ambulation/Gait Ambulation/Gait assistance: Min assist Gait Distance (Feet): 50 Feet (x2) Assistive device: Rolling walker (2 wheels) Gait Pattern/deviations: Step-to pattern, Trunk flexed       General Gait Details: Cues for safety, posture. HR up to 133 bpm, O2 95% on RA, dyspnea 2/4. Followed closely with recliner and used it to allow pt to rest between walks.   Stairs             Wheelchair Mobility    Modified Rankin (Stroke Patients Only)       Balance Overall balance assessment: Needs assistance   Sitting balance-Leahy Scale: Good     Standing balance support: During functional activity, Bilateral upper extremity supported, Reliant on assistive device for balance Standing balance-Leahy Scale: Fair                              Cognition Arousal/Alertness: Awake/alert Behavior During Therapy: WFL for tasks assessed/performed Overall Cognitive Status: Within Functional Limits for tasks assessed                                          Exercises Total Joint Exercises Ankle Circles/Pumps: AROM, Both, 10 reps Quad Sets: AROM, Both, 10 reps Heel Slides:  AAROM, Right, 10 reps    General Comments        Pertinent Vitals/Pain Pain Assessment Pain Assessment: 0-10 Pain Score: 5  Pain Location: R thigh Pain Descriptors / Indicators: Discomfort, Tightness, Burning Pain Intervention(s): Limited activity within patient's tolerance, Monitored during session, Repositioned, Ice applied    Home Living                          Prior Function            PT Goals (current goals can now be found in the care plan section)  Progress towards PT goals: Progressing toward goals    Frequency    7X/week      PT Plan Current plan remains appropriate    Co-evaluation              AM-PAC PT "6 Clicks" Mobility   Outcome Measure  Help needed turning from your back to your side while in a flat bed without using bedrails?: A Little Help needed moving from lying on your back to sitting on the side of a flat bed without using bedrails?: A Lot Help needed moving to and from a bed to a chair (including a wheelchair)?: A Little Help needed standing up from a chair using your arms (e.g., wheelchair or bedside chair)?: A Little Help needed to walk in hospital room?: A Little Help needed climbing 3-5 steps with a railing? : A Lot 6 Click Score: 16    End of Session Equipment Utilized During Treatment: Gait belt Activity Tolerance: Patient tolerated treatment well;Patient limited by fatigue Patient left: in chair;with call bell/phone within reach;with family/visitor present   PT Visit Diagnosis: Unsteadiness on feet (R26.81);Muscle weakness (generalized) (M62.81);Pain Pain - Right/Left: Right Pain - part of body: Hip (blister on thigh)     Time: [id number] PT Time Calculation (min) (ACUTE ONLY): 23 min  Charges:  $Gait Training: 23-37 mins                        Doreatha Massed, PT Acute Rehabilitation  Office: [phone] Pager: [phone]

## 2021-11-02 NOTE — Progress Notes (Signed)
    Subjective:  Patient reports pain as mild to moderate.  Denies N/V/CP/SOB/Abd pain. Patient states that she felt much better getting out of bed yesterday with therapy. Denied dizziness. She did say that her heart rate was increased yesterday with ambulation. She also stated that icing the hip really helped.  Patient states she was having some urinary retention and after bladder scan was retaining 300cc of urine. Nursing placed foley catheter vs in and out.   Objective:   VITALS:   Vitals:   11/02/21 0400 11/02/21 0455 11/02/21 0500 11/02/21 0600  BP: 127/75  (!) 155/71 137/63  Pulse: 92  95 93  Resp: 10  13 13   Temp:  98.6 F (37 C)    TempSrc:  Axillary    SpO2: 98%  100% 98%  Weight:      Height:        Patient is lying in bed. NAD Neurologically intact ABD soft Neurovascular intact Sensation intact distally Intact pulses distally Dorsiflexion/Plantar flexion intact No cellulitis present Compartment soft Dressing: -Aquacel dressing C/D/I -Changed dressing around Aquacel dressing. Just superior to the incision along where the edge of the aquacel dressing was it appears as if a blister formed and drained. Gauze applied under edge of dressing over exposed area, ABD pad placed over gauze.   Lab Results  Component Value Date   WBC 10.4 11/02/2021   HGB 8.7 (L) 11/02/2021   HCT 26.3 (L) 11/02/2021   MCV 93.3 11/02/2021   PLT 306 11/02/2021   BMET    Component Value Date/Time   NA 139 11/02/2021 0229   K 3.8 11/02/2021 0229   CL 107 11/02/2021 0229   CO2 27 11/02/2021 0229   GLUCOSE 137 (H) 11/02/2021 0229   BUN 15 11/02/2021 0229   CREATININE 0.68 11/02/2021 0229   CALCIUM 7.7 (L) 11/02/2021 0229   GFRNONAA >60 11/02/2021 0229     Assessment/Plan:     Principal Problem:   S/P total hip arthroplasty Active Problems:   ABLA (acute blood loss anemia)   Postoperative hemorrhagic shock   AKI (acute kidney injury) (HCC)   Leukocytosis   Prediabetes    Sinus tachycardia   Obesity (BMI 30-39.9)  ABLA. Hemoglobin 8.7 and trending upwards (8.2 yesterday). She has had 4 units PRBCs since admission. Continue to monitor. Transfuse if hemoglobin <7.0.    Hypokalemia: Patients potassium 3.8 today. Continue to monitor.  CT to rule out PE last night negative.   WBAT with walker DVT ppx: Aspirin 81mg  BID, SCDs, TEDS PO pain control PT/OT: Patient ambulated 25 feet with physical therapy yesterday. Her session was slightly limited due to increased HR. Continue to get up with therapy and with nursing staff.   D/c foley. Try to ambulate to the bathroom vs bedside commode.  Up with therapy Ice hip throughout the day for swelling and pain.    0230, PA-C 11/02/2021, 7:13 AM  [facility]  [place] [address], [place], [address] [place] Phone: [phone] [email or web address] Facebook  [phone]

## 2021-11-02 NOTE — Progress Notes (Addendum)
Physical Therapy Treatment Patient Details Name: Monica Martin MRN: [id number] DOB: [birth year] Today's Date: 11/02/2021   History of Present Illness 71 yo F hx OA hips presented to outpt surgical center 5/19 for planned hip arthroplasty. During case had EBL about 1L. Received crystalloid in resuscitation during case and did require pressors intra-op, but was able to wean off in PACU. Direct admit to [facility] floor then to ICU due to hypotensive and required epi gtt.    PT Comments    Pt reported she felt too fatigued to ambulate with PT again today. She did agree to ROM exercises for R LE. Will continue to progress activity as tolerated.    Recommendations for follow up therapy are one component of a multi-disciplinary discharge planning process, led by the attending physician.  Recommendations may be updated based on patient status, additional functional criteria and insurance authorization.  Follow Up Recommendations  Follow physician's recommendations for discharge plan and follow up therapies     Assistance Recommended at Discharge Frequent or constant Supervision/Assistance  Patient can return home with the following A little help with walking and/or transfers;A little help with bathing/dressing/bathroom;Help with stairs or ramp for entrance;Assistance with cooking/housework;Assist for transportation   Equipment Recommendations  None recommended by PT    Recommendations for Other Services       Precautions / Restrictions Precautions Precautions: Fall Precaution Comments: monitor HR Restrictions Weight Bearing Restrictions: No RLE Weight Bearing: Weight bearing as tolerated     Mobility  Bed Mobility                Transfers                   Ambulation/Gait              Stairs             Wheelchair Mobility    Modified Rankin (Stroke Patients Only)       Balance                                         Cognition Arousal/Alertness: Awake/alert Behavior During Therapy: WFL for tasks assessed/performed Overall Cognitive Status: Within Functional Limits for tasks assessed                                          Exercises Total Joint Exercises Ankle Circles/Pumps: AROM, Both, 10 reps Quad Sets: AROM, Both, 10 reps Heel Slides: AAROM, Right, 10 reps Hip ABduction/ADduction: AAROM, Right, 10 reps    General Comments        Pertinent Vitals/Pain Pain Assessment Pain Assessment: Faces Pain Score: 5  Faces Pain Scale: Hurts little more Pain Location: R thigh Pain Descriptors / Indicators: Dull, Sore Pain Intervention(s): Limited activity within patient's tolerance, Monitored during session, Ice applied    Home Living                          Prior Function            PT Goals (current goals can now be found in the care plan section) Progress towards PT goals: Progressing toward goals    Frequency    7X/week      PT Plan Current plan remains appropriate  Co-evaluation              AM-PAC PT "6 Clicks" Mobility   Outcome Measure                   End of Session Equipment Utilized During Treatment: Gait belt Activity Tolerance: Patient tolerated treatment well;Patient limited by fatigue Patient left: in bed;with call bell/phone within reach   PT Visit Diagnosis: Muscle weakness (generalized) (M62.81);Pain;Other abnormalities of gait and mobility (R26.89) Pain - Right/Left: Right Pain - part of body: Hip (thigh)     Time: [id number] PT Time Calculation (min) (ACUTE ONLY): 10 min  Charges:  $Therapeutic Exercise: 8-22 mins              Doreatha Massed, PT Acute Rehabilitation  Office: [phone] Pager: [phone]

## 2021-11-02 NOTE — Assessment & Plan Note (Signed)
Incidental finding of bilateral adrenal nodule on CT chest -will need repeat CT in 1 year for surveillance

## 2021-11-02 NOTE — Progress Notes (Addendum)
Consult NOTE    Monica Martin  ZOX:[id number] DOB: January 03, 1951 DOA: 10/28/2021 PCP: Creola Corn, MD  No chief complaint on file.   Brief Narrative:  71 yo F hx OA hips presented to outpt surgical center 5/19 for planned hip arthroplasty. During case had EBL about 1L. Received crystalloid in resuscitation during case and did require pressors intra-op, but was able to wean off in PACU. Direct admit to [facility] floor for obvs, however in transport, patient became hypotensive and required epi gtt.    Significant events 5/19 admit to [facility] following hip surgery due to EBL. Subsequently requiring pressors. Transfer to ICU  5/22 Off pressors, Hgb stable, s/p 4 units pRBC since admission    Assessment & Plan:   Principal Problem:   S/P total hip arthroplasty Active Problems:   ABLA (acute blood loss anemia)   Postoperative hemorrhagic shock   Sinus tachycardia   AKI (acute kidney injury) (HCC)   Prediabetes   Leukocytosis   Obesity (BMI 30-39.9)   Adrenal nodule (HCC)   Hypoalbuminemia  Addendum: Informed by nursing staff that CT chest report from yesterday had been addended to include possible right subsegmental pulmonary embolus.  This was reviewed with orthopedics with recommendations to start the patient on Eliquis 5 mg twice daily.  Understanding that full treatment dose would be 10 mg twice daily for 7 days followed by 5 mg twice daily, and patient's current clinical setting with significant recent bleeding, 10 mg dose may increase her bleeding risk substantially at which point it would outweigh benefits.  We will monitor closely on 5 mg twice daily.  Discussed risks and benefits with patient as well.  Assessment and Plan: * S/P total hip arthroplasty Per orthopedics Ortho is primary Has blister above surgical incision that ruptured and is covered On duricef per orthopedics Complains of burning pain round blisters, will try lidocaine patch  Postoperative hemorrhagic shock Transferred  to [facility] after hip arthroplasty 5/19 with 1 L EBL Unknown preop Hb/Hct, will clarify with ortho (per discussion, on 09/14/21, Hb 16.7 Hct 51.2) S/p 4 units pRBC Pressors have been weaned off  BP appropriate today, a little high Can potentially consider some BB or lasix Will continue to monitor    Sinus tachycardia Likely reflective of symptomatic anemia Symptoms seem to have improved with volume resuscitation/transfusions  Follow EKG TSH normal CT chest neg for PE Echo pending  Prediabetes A1c 6 SSI  AKI (acute kidney injury) (HCC) Improved Presented with creatinine of 1.08  Leukocytosis Resolved, likely reactive at presentation On abx per ortho   Obesity (BMI 30-39.9) noted  Hypoalbuminemia Likely contributing to swelling and appearance of thickened gall bladder on CT (she is otherwise asymptomatic) Will give one dose of albumin since it may help with volume management  Adrenal nodule (HCC) Incidental finding of bilateral adrenal nodule on CT chest -will need repeat CT in 1 year for surveillance        DVT prophylaxis: aspirin, ted, scd Code Status: full Family Communication: husband at bedside Dispo: orthopedics is primary   Consultants:  [facility] is consulting PCCM Orthopedics is primary  Procedures:  Central line 5/19 R total hip arthroplasty 5/19  Antimicrobials:  Anti-infectives (From admission, onward)    Start     Dose/Rate Route Frequency Ordered Stop   10/30/21 1445  cefadroxil (DURICEF) capsule 1,000 mg        1,000 mg Oral 2 times daily 10/30/21 1423 11/04/21 0959       Subjective: Sitting up  in chair. Feels that pain is doing better. She did feel short of breath on exertion, but feels better after resting. Feels as though she can walk more today than she did yesterday  Objective: Vitals:   11/02/21 0600 11/02/21 0800 11/02/21 0900 11/02/21 1000  BP: 137/63 (!) 159/71 (!) 140/55 (!) 163/77  Pulse: 93 94 97 (!) 110  Resp: 13 12 15 16    Temp:  98.3 F (36.8 C)    TempSrc:  Oral    SpO2: 98% 93% 96% 95%  Weight:      Height:        Intake/Output Summary (Last 24 hours) at 11/02/2021 1055 Last data filed at 11/02/2021 0800 Gross per 24 hour  Intake 1672.48 ml  Output 1800 ml  Net -127.52 ml   Filed Weights   10/29/21 1200  Weight: 99.8 kg    Examination:  General exam: Appears calm and comfortable  Respiratory system: unlabored Cardiovascular system: RRR. Gastrointestinal system: Abdomen is nondistended, soft and nontender Central nervous system: Alert and oriented. No focal neurological deficits. Extremities: RLE swelling, ruptured blistered with exposed dermis, pink   Data Reviewed: I have personally reviewed following labs and imaging studies  CBC: Recent Labs  Lab 10/30/21 0645 10/30/21 1322 10/31/21 0640 11/01/21 0610 11/01/21 1741 11/02/21 0229  WBC 8.7 9.3 9.0 10.3  --  10.4  NEUTROABS  --   --   --   --   --  7.3  HGB 6.6* 8.0* 7.8* 8.2* 9.8* 8.7*  HCT 19.8* 23.8* 23.1* 25.5* 29.3* 26.3*  MCV 93.0 90.8 92.0 94.1  --  93.3  PLT 246 254 240 301  --  306    Basic Metabolic Panel: Recent Labs  Lab 10/29/21 0315 10/30/21 0645 10/31/21 0640 10/31/21 0758 11/01/21 0610 11/02/21 0229  NA 140 143 142  --  140 139  K 4.3 3.6 3.3*  --  3.4* 3.8  CL 114* 112* 109  --  106 107  CO2 20* 26 28  --  26 27  GLUCOSE 300* 181* 150*  --  153* 137*  BUN 19 13 11   --  12 15  CREATININE 1.08* 0.75 0.67  --  0.69 0.68  CALCIUM 7.3* 7.6* 7.6*  --  7.8* 7.7*  MG  --   --   --  2.0 2.1 2.0  PHOS  --   --   --   --   --  4.2    GFR: Estimated Creatinine Clearance: 79.1 mL/min (by C-G formula based on SCr of 0.68 mg/dL).  Liver Function Tests: Recent Labs  Lab 10/29/21 0315 10/31/21 0640 11/02/21 0229  AST 34 37 18  ALT 17 22 21   ALKPHOS 30* 46 51  BILITOT 0.9 0.9 1.2  PROT 4.1* 4.4* 4.6*  ALBUMIN 2.8* 2.4* 2.3*    CBG: Recent Labs  Lab 11/01/21 0731 11/01/21 1213 11/01/21 1646  11/01/21 2226 11/02/21 0728  GLUCAP 137* 147* 130* 136* 147*     Recent Results (from the past 240 hour(s))  MRSA Next Gen by PCR, Nasal     Status: None   Collection Time: 10/28/21  6:25 PM   Specimen: Nasal Mucosa; Nasal Swab  Result Value Ref Range Status   MRSA by PCR Next Gen NOT DETECTED NOT DETECTED Final    Comment: (NOTE) The GeneXpert MRSA Assay (FDA approved for NASAL specimens only), is one component of a comprehensive MRSA colonization surveillance program. It is not intended to diagnose MRSA infection nor  to guide or monitor treatment for MRSA infections. Test performance is not FDA approved in patients less than 69 years old. Performed at [facility], [address] [place], [place] [id number]          Radiology Studies: CT Angio Chest Pulmonary Embolism (PE) W or WO Contrast  Result Date: 11/01/2021 CLINICAL DATA:  Pulmonary embolus suspected with unknown D-dimer. EXAM: CT ANGIOGRAPHY CHEST WITH CONTRAST TECHNIQUE: Multidetector CT imaging of the chest was performed using the standard protocol during bolus administration of intravenous contrast. Multiplanar CT image reconstructions and MIPs were obtained to evaluate the vascular anatomy. RADIATION DOSE REDUCTION: This exam was performed according to the departmental dose-optimization program which includes automated exposure control, adjustment of the mA and/or kV according to patient size and/or use of iterative reconstruction technique. CONTRAST:  75mL OMNIPAQUE IOHEXOL 350 MG/ML SOLN COMPARISON:  Chest radiograph 10/28/2021 FINDINGS: Cardiovascular: Examination is technically limited due to poor contrast bolus and motion artifact. However, there is moderately good opacification of the central and proximal segmental pulmonary arteries. No focal filling defects are demonstrated in these visualized vessels. No evidence of significant central pulmonary embolus. Normal heart size. No pericardial  effusions. Normal caliber thoracic aorta. Calcification of the aorta. Mediastinum/Nodes: Esophagus is decompressed. Small esophageal hiatal hernia. No significant lymphadenopathy. Thyroid gland is unremarkable. Lungs/Pleura: Motion artifact limits the examination. There are areas of linear atelectasis in the right mid and both lower lungs. No focal consolidation. No pleural effusions. No pneumothorax. Upper Abdomen: Diffuse fatty infiltration of the liver. Focal circumscribed low-attenuation lesions in the lateral segment left lobe, largest measuring 3.6 cm diameter. Lesions are cystic by density measurements. The gallbladder is distended with apparent gallbladder wall thickening and adjacent stranding. No stones are identified but the appearance is suspicious for acute cholecystitis. 13 mm left adrenal gland nodule. Density measures of 21 Hounsfield units in the contrast-enhanced study. Right adrenal gland nodule measuring 1.5 cm diameter. Twenty-nine Hounsfield units on a contrast enhanced study. Infiltration of subcutaneous fat of the right flank may indicate contusion or inflammatory process. No loculated collections. Musculoskeletal: Degenerative changes in the spine. No destructive bone lesions. Review of the MIP images confirms the above findings. IMPRESSION: 1. Technically limited examination but no evidence for significant central pulmonary embolus. 2. Bilateral adrenal gland nodules. 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 Recommend 1 year follow up adrenal washout CT. If stable for > 1 year, no further f/u imaging. JACR 2017 Aug; 14(8):1038-44, JCAT 2016 Mar-Apr; 40(2):194-200, Urol J 2006 Spring; 3(2):71-4. 3. No evidence of active pulmonary disease. 4. Diffuse fatty infiltration of the liver. 5. Thick-walled distended gallbladder with inflammatory stranding suggesting cholecystitis. No stones are identified by CT. 6. Infiltration in the subcutaneous fat over the right  flank may demonstrate contusion or inflammatory process. No loculation. Electronically Signed   By: Burman NievesWilliam  Stevens M.D.   On: 11/01/2021 22:13        Scheduled Meds:  acetaminophen  1,000 mg Oral Q8H   aspirin  81 mg Oral BID WC   cefadroxil  1,000 mg Oral BID   Chlorhexidine Gluconate Cloth  6 each Topical Daily   insulin aspart  0-9 Units Subcutaneous TID WC   lidocaine  1 patch Transdermal Q24H   polyethylene glycol  17 g Oral Daily   Continuous Infusions:  sodium chloride Stopped (10/31/21 1216)   albumin human       LOS: 4 days    Time spent: over 30  min    Erick Blinks, MD [facility]   To contact the attending provider between 7A-7P or the covering provider during after hours 7P-7A, please log into the web site [email or web address] and access using universal [place] password for that web site. If you do not have the password, please call the hospital operator.  11/02/2021, 10:55 AM

## 2021-11-03 ENCOUNTER — Inpatient Hospital Stay (HOSPITAL_COMMUNITY): Payer: Medicare HMO

## 2021-11-03 DIAGNOSIS — I2699 Other pulmonary embolism without acute cor pulmonale: Secondary | ICD-10-CM | POA: Diagnosis not present

## 2021-11-03 DIAGNOSIS — E278 Other specified disorders of adrenal gland: Secondary | ICD-10-CM | POA: Diagnosis not present

## 2021-11-03 DIAGNOSIS — Z96641 Presence of right artificial hip joint: Secondary | ICD-10-CM | POA: Diagnosis not present

## 2021-11-03 DIAGNOSIS — D62 Acute posthemorrhagic anemia: Secondary | ICD-10-CM | POA: Diagnosis not present

## 2021-11-03 DIAGNOSIS — N179 Acute kidney failure, unspecified: Secondary | ICD-10-CM | POA: Diagnosis not present

## 2021-11-03 LAB — GLUCOSE, CAPILLARY
Glucose-Capillary: 175 mg/dL — ABNORMAL HIGH (ref 70–99)
Glucose-Capillary: 124 mg/dL — ABNORMAL HIGH (ref 70–99)
Glucose-Capillary: 124 mg/dL — ABNORMAL HIGH (ref 70–99)
Glucose-Capillary: 137 mg/dL — ABNORMAL HIGH (ref 70–99)

## 2021-11-03 LAB — CBC
HCT: 27.3 % — ABNORMAL LOW (ref 36.0–46.0)
Hemoglobin: 8.9 g/dL — ABNORMAL LOW (ref 12.0–15.0)
MCH: 30.6 pg (ref 26.0–34.0)
MCHC: 32.6 g/dL (ref 30.0–36.0)
MCV: 93.8 fL (ref 80.0–100.0)
Platelets: 405 10*3/uL — ABNORMAL HIGH (ref 150–400)
RBC: 2.91 MIL/uL — ABNORMAL LOW (ref 3.87–5.11)
RDW: 15.2 % (ref 11.5–15.5)
WBC: 11.7 10*3/uL — ABNORMAL HIGH (ref 4.0–10.5)
nRBC: 0.6 % — ABNORMAL HIGH (ref 0.0–0.2)

## 2021-11-03 MED ORDER — BISACODYL 10 MG RE SUPP
10.0000 mg | Freq: Once | RECTAL | Status: DC
  Filled 2021-11-03: qty 1

## 2021-11-03 NOTE — Progress Notes (Signed)
PT Cancellation Note  Patient Details Name: Monica Martin MRN: [id number] DOB: 04/16/1951   Cancelled Treatment:    Reason Eval/Treat Not Completed: Fatigue/lethargy limiting ability to participate, patient has returned to bed and resting. Will check back another time or tomorrow.  [place] Pager [phone] Office [phone]    Claretha Cooper 11/03/2021, 1:36 PM

## 2021-11-03 NOTE — Progress Notes (Signed)
Physical Therapy Treatment Patient Details Name: Monica Martin MRN: [id number] DOB: 02/10/1951 Today's Date: 11/03/2021   History of Present Illness 71 yo F hx OA hips presented to outpt surgical center 5/19 for planned hip arthroplasty. During case had EBL about 1L. Received crystalloid in resuscitation during case and did require pressors intra-op, but was able to wean off in PACU. Direct admit to [facility] floor then to ICU due to hypotensive and required epi gtt. Treated for small PE    PT Comments    The patient reports feeling stronger but fatigues with limited ambulation distances of 20' x 2. HR 110- 142 with ambulation. BP  164/77 supine, 150/74 post ambulation . Spo2 100% RA.  Recommendations for follow up therapy are one component of a multi-disciplinary discharge planning process, led by the attending physician.  Recommendations may be updated based on patient status, additional functional criteria and insurance authorization.  Follow Up Recommendations  Follow physician's recommendations for discharge plan and follow up therapies     Assistance Recommended at Discharge Frequent or constant Supervision/Assistance  Patient can return home with the following A little help with walking and/or transfers;A little help with bathing/dressing/bathroom;Help with stairs or ramp for entrance;Assistance with cooking/housework;Assist for transportation   Equipment Recommendations  None recommended by PT    Recommendations for Other Services       Precautions / Restrictions Precautions Precautions: Fall Precaution Comments: monitor HR/ BP     Mobility  Bed Mobility   Bed Mobility: Supine to Sit     Supine to sit: Mod assist     General bed mobility comments: with use of gait belt as leg lifter with increased time. assistance with trunk, moving to right EDGE of bed, suggested that she try left side of bed(her side at home)    Transfers   Equipment used: Rolling walker (2 wheels)    Sit to Stand: Min guard           General transfer comment: from bed and BSC    Ambulation/Gait Ambulation/Gait assistance: Min guard Gait Distance (Feet): 25 Feet (x 2) Assistive device: Rolling walker (2 wheels) Gait Pattern/deviations: Trunk flexed, Step-through pattern Gait velocity: decr     General Gait Details: Cues for safety, posture. HR up to 142 bpm, O2 95% on RA, dyspnea 2/4. walks.   Stairs             Wheelchair Mobility    Modified Rankin (Stroke Patients Only)       Balance Overall balance assessment: Needs assistance Sitting-balance support: Feet supported, Bilateral upper extremity supported Sitting balance-Leahy Scale: Good     Standing balance support: During functional activity, Bilateral upper extremity supported, Reliant on assistive device for balance Standing balance-Leahy Scale: Fair                              Cognition Arousal/Alertness: Awake/alert Behavior During Therapy: WFL for tasks assessed/performed Overall Cognitive Status: Within Functional Limits for tasks assessed                                          Exercises      General Comments        Pertinent Vitals/Pain Pain Assessment Faces Pain Scale: No hurt    Home Living  Prior Function            PT Goals (current goals can now be found in the care plan section) Acute Rehab PT Goals Patient Stated Goal: go home Progress towards PT goals: Progressing toward goals    Frequency    7X/week      PT Plan Current plan remains appropriate    Co-evaluation              AM-PAC PT "6 Clicks" Mobility   Outcome Measure  Help needed turning from your back to your side while in a flat bed without using bedrails?: A Little Help needed moving from lying on your back to sitting on the side of a flat bed without using bedrails?: A Little Help needed moving to and from a bed to a chair  (including a wheelchair)?: A Little Help needed standing up from a chair using your arms (e.g., wheelchair or bedside chair)?: A Little Help needed to walk in hospital room?: A Little Help needed climbing 3-5 steps with a railing? : A Lot 6 Click Score: 17    End of Session Equipment Utilized During Treatment: Gait belt Activity Tolerance: Patient tolerated treatment well;Patient limited by fatigue Patient left: in chair;with call bell/phone within reach Nurse Communication: Mobility status Pain - Right/Left: Right Pain - part of body: Hip     Time: 6834-1962 PT Time Calculation (min) (ACUTE ONLY): 43 min  Charges:  $Gait Training: 23-37 mins $Self Care/Home Management: 8-22                     Blanchard Kelch PT Acute Rehabilitation Services Pager [phone] Office [phone]    Rada Hay 11/03/2021, 12:27 PM

## 2021-11-03 NOTE — Assessment & Plan Note (Addendum)
Noted to have tachycardia and shortness of breath  CT angiogram the chest shows possible right middle lobe subsegmental filling defect.  Her Wells score for pulmonary embolus would be between 3-6 points, putting her at moderate risk for pulmonary embolus.  In current clinical setting, with shortness of breath, tachycardia, recent immobilization/surgery, would elect to treat the patient as pulmonary embolus  Venous Dopplers negative for DVT in lower extremities Echocardiogram without evidence of right heart strain  Discussed with orthopedics and started on Xarelto.  Also discussed risk/benefits of anticoagulation versus no anticoagulation with patient who is in agreement with starting Xarelto.

## 2021-11-03 NOTE — Discharge Instructions (Signed)

## 2021-11-03 NOTE — Progress Notes (Signed)
Subjective:  Patient reports pain as mild to moderate.  Denies N/V/CP/SOB/Abd pain. Patient reports that she is doing better. She states that the discomfort/burning where she had the blisters rupture is improving. Hospitalist ordered some lidocaine patches to help with the pain. She feels that her pain and swelling is improving as well.   She states that her catheter was removed this morning and she has been ambulating to the bathroom without difficulty. She worked with PT yesterday and states she did well.  Spoke with patient and nurse about continued cryotherapy for the hip for pain and swelling.   She is inquiring when she can go home to her dogs as she is eager to do so.   All questions from husband and patient answered today.    Objective:   VITALS:   Vitals:   11/03/21 0500 11/03/21 0600 11/03/21 0700 11/03/21 0800  BP: 129/61 (!) 142/59 (!) 151/107   Pulse: (!) 104 (!) 101 98   Resp: 13 14 (!) 9   Temp:    98.6 F (37 C)  TempSrc:    Oral  SpO2: 94% 95% 96%   Weight:      Height:        Patient is sitting in recliner with husband and family at bedside. NAD. Her demeanor and color is continuing to improve daily  Neurologically intact ABD soft Neurovascular intact Sensation intact distally Intact pulses distally Dorsiflexion/Plantar flexion intact No cellulitis present Compartment soft Dressings: - Aquacel dressing intact. No drainage coming from incision site.  - Dressing just superior to aquacel dressing removed to check area where a blister formed and drained. She is still having drainage from the area. It was covered with xeroform then dry gauze and abd pad with tape. Nurse stated she would redress area. Spoke with nurse about only 1 layer of the xeroform dressing then dry gauze and abd pad.  - Another small fluid filled blister just inferior to aquacel dressing. Continue to monitor. No concerns for infection.  Lab Results  Component Value Date   WBC 11.7  (H) 11/03/2021   HGB 8.9 (L) 11/03/2021   HCT 27.3 (L) 11/03/2021   MCV 93.8 11/03/2021   PLT 405 (H) 11/03/2021   BMET    Component Value Date/Time   NA 139 11/02/2021 0229   K 3.8 11/02/2021 0229   CL 107 11/02/2021 0229   CO2 27 11/02/2021 0229   GLUCOSE 137 (H) 11/02/2021 0229   BUN 15 11/02/2021 0229   CREATININE 0.68 11/02/2021 0229   CALCIUM 7.7 (L) 11/02/2021 0229   GFRNONAA >60 11/02/2021 0229     Assessment/Plan:     Principal Problem:   S/P total hip arthroplasty Active Problems:   ABLA (acute blood loss anemia)   Postoperative hemorrhagic shock   AKI (acute kidney injury) (HCC)   Leukocytosis   Prediabetes   Sinus tachycardia   Obesity (BMI 30-39.9)   Adrenal nodule (HCC)   Hypoalbuminemia  ABLA. Hemoglobin 8.9 and trending upwards (8.7 yesterday). She has had 4 units PRBCs since admission. Continue to monitor. Transfuse if hemoglobin <7.0.    Hypokalemia: Resolved potassium 3.8 yesterday.   CT angio chest for pulmonary embolism was ruled negative 11/02/22. Addendum was made yesterday 11/02/21 with findings noted to have a single subsegmental filling defect to suggest right middle lobe pulmonary artery branch that may indicate PE. Dr. Lyla Glassing spoke with hospitalist yesterday to change blood thinner to Eliquis 5mg  BID. Understanding that full treatment dose would  be 10 mg twice daily for 7 days followed by 5 mg twice daily, and patient's current clinical setting with significant recent bleeding, 10 mg dose may increase her bleeding risk substantially at which point it would outweigh benefits.  We will monitor closely on 5 mg twice daily. Continue this regimen.    WBAT with walker DVT ppx: Eliquis 5mg  BID, SCDs, TEDS PO pain control PT/OT: Patient ambulated 50 feet  Dispo: Patient to transfer to med surg once hospitalist deems medically able. Patient to d/c home with HEP from an orthopedic standpoint and once cleared medically by hospitalist.   Catheter was  removed this morning. Patient has been ambulating to the bathroom with no issues reported. Continue to ambulate to the bathroom vs bedside commode.  Up with therapy Ice hip throughout the day for swelling and pain.    Charlott Rakes, PA-C 11/03/2021, 10:55 AM  [facility]  [place] [address], [place], [place] [address] Phone: [phone] [email or web address] Facebook  [employer]

## 2021-11-03 NOTE — Progress Notes (Signed)
BLE venous duplex has been completed.  Results can be found under chart review under CV PROC. 11/03/2021 11:29 AM Keyle Doby RVT, RDMS

## 2021-11-03 NOTE — Progress Notes (Signed)
OT Cancellation Note  Patient Details Name: DESHONDA BOWN MRN: [id number] DOB: 1950/09/18   Cancelled Treatment:    Reason Eval/Treat Not Completed: Patient at procedure or test/ unavailable;Fatigue/lethargy limiting ability to participate: Attempted to see pt for OT session at 0835, but pt with another discipline for in-room procedure. Attempted again at 1415, but pt declined due to just coming back to bed from the bathroom and too fatigued right now. Pt agreeable to OT attempting to see her tomorrow. Pt also stated that while she may use the reacher for things, she is comfortable with her husband assisting with LE ADLs and AE does not have to play a major role in future OT sessions.   Julien Girt 11/03/2021, 2:15 PM

## 2021-11-03 NOTE — Progress Notes (Signed)
Consult NOTE    Monica Martin  ZOX:[id number] DOB: [birth year] DOA: 10/28/2021 PCP: Creola Corn, MD  No chief complaint on file.   Brief Narrative:  71 yo F hx OA hips presented to outpt surgical center 5/19 for planned hip arthroplasty. During case had EBL about 1L. Received crystalloid in resuscitation during case and did require pressors intra-op, but was able to wean off in PACU. Direct admit to [facility] floor for obvs, however in transport, patient became hypotensive and required epi gtt.    Significant events 5/19 admit to [facility] following hip surgery due to EBL. Subsequently requiring pressors. Transfer to ICU  5/22 Off pressors, Hgb stable, s/p 4 units pRBC since admission    Assessment & Plan:   Principal Problem:   S/P total hip arthroplasty Active Problems:   ABLA (acute blood loss anemia)   Postoperative hemorrhagic shock   Sinus tachycardia   AKI (acute kidney injury) (HCC)   Prediabetes   Leukocytosis   Obesity (BMI 30-39.9)   Adrenal nodule (HCC)   Hypoalbuminemia   Acute pulmonary embolus (HCC)    Assessment and Plan: * S/P total hip arthroplasty Per orthopedics Ortho is primary Has blister above surgical incision that ruptured and is covered On duricef per orthopedics Complains of burning pain round blisters, will try lidocaine patch  Postoperative hemorrhagic shock Transferred to [facility] after hip arthroplasty 5/19 with 1 L EBL Unknown preop Hb/Hct, will clarify with ortho (per discussion, on 09/14/21, Hb 16.7 Hct 51.2) S/p 4 units pRBC Pressors have been weaned off  BP appropriate today, a little high Can potentially consider some BB or lasix Will continue to monitor    Sinus tachycardia Likely reflective of symptomatic anemia Symptoms seem to have improved with volume resuscitation/transfusions  Follow EKG TSH normal CT chest with possible right middle lobe subsegmental embolus Echocardiogram with preserved ejection fraction  Prediabetes A1c  6 SSI  AKI (acute kidney injury) (HCC) Improved Presented with creatinine of 1.08  Leukocytosis Resolved, likely reactive at presentation On abx per ortho   Obesity (BMI 30-39.9) noted  Acute pulmonary embolus (HCC) Noted to have tachycardia and shortness of breath  CT angiogram the chest shows possible right middle lobe subsegmental filling defect.  Her Wells score for pulmonary embolus would be between 3-6 points, putting her at moderate risk for pulmonary embolus.  In current clinical setting, with shortness of breath, tachycardia, recent immobilization/surgery, would elect to treat the patient as pulmonary embolus  Venous Dopplers negative for DVT in lower extremities Echocardiogram without evidence of right heart strain  Discussed with orthopedics and started on Xarelto.  Also discussed risk/benefits of anticoagulation versus no anticoagulation with patient who is in agreement with starting Xarelto.  Hypoalbuminemia Likely contributing to swelling and appearance of thickened gall bladder on CT (she is otherwise asymptomatic) Will give one dose of albumin since it may help with volume management  Adrenal nodule (HCC) Incidental finding of bilateral adrenal nodule on CT chest -will need repeat CT in 1 year for surveillance        DVT prophylaxis: Eliquis ted, scd Code Status: full Family Communication: husband at bedside Dispo: orthopedics is primary   Consultants:  [facility] is consulting PCCM Orthopedics is primary  Procedures:  Central line 5/19 R total hip arthroplasty 5/19  Antimicrobials:  Anti-infectives (From admission, onward)    Start     Dose/Rate Route Frequency Ordered Stop   10/30/21 1445  cefadroxil (DURICEF) capsule 1,000 mg  1,000 mg Oral 2 times daily 10/30/21 1423 11/04/21 0959       Subjective: She is feeling better today.  Had Foley catheter removed earlier today.  Feels that shortness of breath is better.  Feels the swelling in  her legs is also improving.  Objective: Vitals:   11/03/21 1300 11/03/21 1400 11/03/21 1500 11/03/21 1535  BP: (!) 150/71   (!) 148/75  Pulse: 98   (!) 108  Resp: Temp:    98.6 F (37 C)  TempSrc:    Oral  SpO2: 96%   99%  Weight:      Height:        Intake/Output Summary (Last 24 hours) at 11/03/2021 1852 Last data filed at 11/03/2021 1400 Gross per 24 hour  Intake 240 ml  Output 475 ml  Net -235 ml   Filed Weights   10/29/21 1200  Weight: 99.8 kg    Examination:  General exam: Alert, awake, oriented x 3 Respiratory system: Clear to auscultation. Respiratory effort normal. Cardiovascular system:RRR. No murmurs, rubs, gallops. Gastrointestinal system: Abdomen is nondistended, soft and nontender. No organomegaly or masses felt. Normal bowel sounds heard. Central nervous system: Alert and oriented. No focal neurological deficits. Extremities: No C/C/E, +pedal pulses Skin: No rashes, lesions or ulcers Psychiatry: Judgement and insight appear normal. Mood & affect appropriate.     Data Reviewed: I have personally reviewed following labs and imaging studies  CBC: Recent Labs  Lab 10/30/21 1322 10/31/21 0640 11/01/21 0610 11/01/21 1741 11/02/21 0229 11/03/21 0243  WBC 9.3 9.0 10.3  --  10.4 11.7*  NEUTROABS  --   --   --   --  7.3  --   HGB 8.0* 7.8* 8.2* 9.8* 8.7* 8.9*  HCT 23.8* 23.1* 25.5* 29.3* 26.3* 27.3*  MCV 90.8 92.0 94.1  --  93.3 93.8  PLT 254 240 301  --  306 405*    Basic Metabolic Panel: Recent Labs  Lab 10/29/21 0315 10/30/21 0645 10/31/21 0640 10/31/21 0758 11/01/21 0610 11/02/21 0229  NA 140 143 142  --  140 139  K 4.3 3.6 3.3*  --  3.4* 3.8  CL 114* 112* 109  --  106 107  CO2 20* 26 28  --  26 27  GLUCOSE 300* 181* 150*  --  153* 137*  BUN --  12 15  CREATININE 1.08* 0.75 0.67  --  0.69 0.68  CALCIUM 7.3* 7.6* 7.6*  --  7.8* 7.7*  MG  --   --   --  2.0 2.1 2.0  PHOS  --   --   --   --   --  4.2     GFR: Estimated Creatinine Clearance: 79.1 mL/min (by C-G formula based on SCr of 0.68 mg/dL).  Liver Function Tests: Recent Labs  Lab 10/29/21 0315 10/31/21 0640 11/02/21 0229  AST 34 37 18  ALT ALKPHOS 30* 46 51  BILITOT 0.9 0.9 1.2  PROT 4.1* 4.4* 4.6*  ALBUMIN 2.8* 2.4* 2.3*    CBG: Recent Labs  Lab 11/02/21 2000 11/03/21 0835 11/03/21 1226 11/03/21 1630 11/03/21 1751  GLUCAP 150* 175* 124* 124* 137*     Recent Results (from the past 240 hour(s))  MRSA Next Gen by PCR, Nasal     Status: None   Collection Time: 10/28/21  6:25 PM   Specimen: Nasal Mucosa; Nasal Swab  Result Value Ref Range Status  MRSA by PCR Next Gen NOT DETECTED NOT DETECTED Final    Comment: (NOTE) The GeneXpert MRSA Assay (FDA approved for NASAL specimens only), is one component of a comprehensive MRSA colonization surveillance program. It is not intended to diagnose MRSA infection nor to guide or monitor treatment for MRSA infections. Test performance is not FDA approved in patients less than 53 years old. Performed at [facility], [address] [place], [place] [address]          Radiology Studies: CT Angio Chest Pulmonary Embolism (PE) W or WO Contrast  Addendum Date: 11/02/2021   ADDENDUM REPORT: 11/02/2021 15:42 ADDENDUM: Upon re-review of CT chest 11/01/2021, the following findings are noted: A single subsegmental filling defect is suggested in a right middle lobe pulmonary artery branch which may indicate a pulmonary embolus. Electronically Signed   By: Burman Nieves M.D.   On: 11/02/2021 15:42   Result Date: 11/02/2021 CLINICAL DATA:  Pulmonary embolus suspected with unknown D-dimer. EXAM: CT ANGIOGRAPHY CHEST WITH CONTRAST TECHNIQUE: Multidetector CT imaging of the chest was performed using the standard protocol during bolus administration of intravenous contrast. Multiplanar CT image reconstructions and MIPs were obtained to evaluate the  vascular anatomy. RADIATION DOSE REDUCTION: This exam was performed according to the departmental dose-optimization program which includes automated exposure control, adjustment of the mA and/or kV according to patient size and/or use of iterative reconstruction technique. CONTRAST:  75mL OMNIPAQUE IOHEXOL 350 MG/ML SOLN COMPARISON:  Chest radiograph 10/28/2021 FINDINGS: Cardiovascular: Examination is technically limited due to poor contrast bolus and motion artifact. However, there is moderately good opacification of the central and proximal segmental pulmonary arteries. No focal filling defects are demonstrated in these visualized vessels. No evidence of significant central pulmonary embolus. Normal heart size. No pericardial effusions. Normal caliber thoracic aorta. Calcification of the aorta. Mediastinum/Nodes: Esophagus is decompressed. Small esophageal hiatal hernia. No significant lymphadenopathy. Thyroid gland is unremarkable. Lungs/Pleura: Motion artifact limits the examination. There are areas of linear atelectasis in the right mid and both lower lungs. No focal consolidation. No pleural effusions. No pneumothorax. Upper Abdomen: Diffuse fatty infiltration of the liver. Focal circumscribed low-attenuation lesions in the lateral segment left lobe, largest measuring 3.6 cm diameter. Lesions are cystic by density measurements. The gallbladder is distended with apparent gallbladder wall thickening and adjacent stranding. No stones are identified but the appearance is suspicious for acute cholecystitis. 13 mm left adrenal gland nodule. Density measures of 21 Hounsfield units in the contrast-enhanced study. Right adrenal gland nodule measuring 1.5 cm diameter. Twenty-nine Hounsfield units on a contrast enhanced study. Infiltration of subcutaneous fat of the right flank may indicate contusion or inflammatory process. No loculated collections. Musculoskeletal: Degenerative changes in the spine. No destructive bone  lesions. Review of the MIP images confirms the above findings. IMPRESSION: 1. Technically limited examination but no evidence for significant central pulmonary embolus. 2. Bilateral adrenal gland nodules. 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 Recommend 1 year follow up adrenal washout CT. If stable for > 1 year, no further f/u imaging. JACR 2017 Aug; 14(8):1038-44, JCAT 2016 Mar-Apr; 40(2):194-200, Urol J 2006 Spring; 3(2):71-4. 3. No evidence of active pulmonary disease. 4. Diffuse fatty infiltration of the liver. 5. Thick-walled distended gallbladder with inflammatory stranding suggesting cholecystitis. No stones are identified by CT. 6. Infiltration in the subcutaneous fat over the right flank may demonstrate contusion or inflammatory process. No loculation. Electronically Signed: By: Burman Nieves M.D. On: 11/01/2021 22:13  ECHOCARDIOGRAM COMPLETE  Result Date: 11/02/2021    ECHOCARDIOGRAM REPORT   Patient Name:   ELINORE SHULTS Date of Exam: 11/02/2021 Medical Rec #:  [id number]      Height:       66.0 in Accession #:    [id number]     Weight:       220.0 lb Date of Birth:  06-14-1950       BSA:          2.083 m Patient Age:    71 years       BP:           147/77 mmHg Patient Gender: F              HR:           114 bpm. Exam Location:  Inpatient Procedure: 2D Echo, Cardiac Doppler and Color Doppler Indications:    Other abnormalities of the heart R00.8  History:        Patient has no prior history of Echocardiogram examinations.                 Risk Factors:Dyslipidemia.  Sonographer:    Eulah Pont RDCS Referring Phys: [phone] A CALDWELL POWELL JR IMPRESSIONS  1. Left ventricular ejection fraction, by estimation, is 60 to 65%. The left ventricle has normal function. The left ventricle has no regional wall motion abnormalities. Left ventricular diastolic parameters are consistent with Grade I diastolic dysfunction (impaired relaxation).  2. Right ventricular systolic  function is normal. The right ventricular size is normal. There is mildly elevated pulmonary artery systolic pressure. The estimated right ventricular systolic pressure is 43.4 mmHg.  3. The mitral valve is normal in structure. Trivial mitral valve regurgitation. No evidence of mitral stenosis.  4. The aortic valve is tricuspid. There is mild calcification of the aortic valve. Aortic valve regurgitation is not visualized. No aortic stenosis is present.  5. The inferior vena cava is normal in size with greater than 50% respiratory variability, suggesting right atrial pressure of 3 mmHg.  6. 3 cm liver cyst noted. FINDINGS  Left Ventricle: Left ventricular ejection fraction, by estimation, is 60 to 65%. The left ventricle has normal function. The left ventricle has no regional wall motion abnormalities. The left ventricular internal cavity size was normal in size. There is  no left ventricular hypertrophy. Left ventricular diastolic parameters are consistent with Grade I diastolic dysfunction (impaired relaxation). Right Ventricle: The right ventricular size is normal. No increase in right ventricular wall thickness. Right ventricular systolic function is normal. There is mildly elevated pulmonary artery systolic pressure. The tricuspid regurgitant velocity is 3.18  m/s, and with an assumed right atrial pressure of 3 mmHg, the estimated right ventricular systolic pressure is 43.4 mmHg. Left Atrium: Left atrial size was normal in size. Right Atrium: Right atrial size was normal in size. Pericardium: There is no evidence of pericardial effusion. Mitral Valve: The mitral valve is normal in structure. Mild mitral annular calcification. Trivial mitral valve regurgitation. No evidence of mitral valve stenosis. Tricuspid Valve: The tricuspid valve is normal in structure. Tricuspid valve regurgitation is trivial. Aortic Valve: The aortic valve is tricuspid. There is mild calcification of the aortic valve. Aortic valve  regurgitation is not visualized. No aortic stenosis is present. Pulmonic Valve: The pulmonic valve was normal in structure. Pulmonic valve regurgitation is not visualized. Aorta: The aortic root is normal in size and structure. Venous: The inferior vena cava is normal in size with greater  than 50% respiratory variability, suggesting right atrial pressure of 3 mmHg. IAS/Shunts: No atrial level shunt detected by color flow Doppler.  LEFT VENTRICLE PLAX 2D LVIDd:         3.90 cm     Diastology LVIDs:         2.70 cm     LV e' medial:    8.68 cm/s LV PW:         1.00 cm     LV E/e' medial:  10.8 LV IVS:        0.90 cm     LV e' lateral:   9.65 cm/s LVOT diam:     2.00 cm     LV E/e' lateral: 9.8 LV SV:         85 LV SV Index:   41 LVOT Area:     3.14 cm  LV Volumes (MOD) LV vol d, MOD A2C: 76.6 ml LV vol d, MOD A4C: 62.6 ml LV vol s, MOD A2C: 25.5 ml LV vol s, MOD A4C: 21.4 ml LV SV MOD A2C:     51.1 ml LV SV MOD A4C:     62.6 ml LV SV MOD BP:      48.0 ml RIGHT VENTRICLE RV S prime:     16.50 cm/s TAPSE (M-mode): 1.9 cm LEFT ATRIUM             Index        RIGHT ATRIUM           Index LA diam:        3.70 cm 1.78 cm/m   RA Area:     12.70 cm LA Vol (A2C):   37.6 ml 18.05 ml/m  RA Volume:   28.20 ml  13.54 ml/m LA Vol (A4C):   45.1 ml 21.66 ml/m LA Biplane Vol: 41.7 ml 20.02 ml/m  AORTIC VALVE LVOT Vmax:   149.00 cm/s LVOT Vmean:  99.000 cm/s LVOT VTI:    0.270 m  AORTA Ao Root diam: 3.20 cm Ao Asc diam:  2.70 cm MITRAL VALVE                TRICUSPID VALVE MV Area (PHT): 5.23 cm     TR Peak grad:   40.4 mmHg MV Decel Time: 145 msec     TR Vmax:        318.00 cm/s MV E velocity: 94.10 cm/s MV A velocity: 104.00 cm/s  SHUNTS MV E/A ratio:  0.90         Systemic VTI:  0.27 m                             Systemic Diam: 2.00 cm Dalton McleanMD Electronically signed by Wilfred Lacy Signature Date/Time: 11/02/2021/5:01:25 PM    Final    VAS [place] LOWER EXTREMITY VENOUS (DVT)  Result Date: 11/03/2021  Lower Venous DVT  Study Patient Name:  JHAYLA PODGORSKI  Date of Exam:   11/03/2021 Medical Rec #: [id number]       Accession #:    [id number] Date of Birth: 04-May-1951        Patient Gender: F Patient Age:   31 years Exam Location:  [facility] Procedure:      VAS [place] LOWER EXTREMITY VENOUS (DVT) Referring Phys: Durward Mallard Maclin Guerrette --------------------------------------------------------------------------------  Indications: Pulmonary embolism. Other Indications: S/P RLE total hip arthroplasty. Limitations: Body habitus, poor ultrasound/tissue interface and bandages. Comparison Study: No previous  exams Performing Technologist: Jody Hill RVT, RDMS  Examination Guidelines: A complete evaluation includes B-mode imaging, spectral Doppler, color Doppler, and power Doppler as needed of all accessible portions of each vessel. Bilateral testing is considered an integral part of a complete examination. Limited examinations for reoccurring indications may be performed as noted. The reflux portion of the exam is performed with the patient in reverse Trendelenburg.  +---------+---------------+---------+-----------+----------+--------------+ RIGHT    CompressibilityPhasicitySpontaneityPropertiesThrombus Aging +---------+---------------+---------+-----------+----------+--------------+ CFV                                                   Not visualized +---------+---------------+---------+-----------+----------+--------------+ SFJ                                                   Not visualized +---------+---------------+---------+-----------+----------+--------------+ FV Prox  Full           Yes      Yes                                 +---------+---------------+---------+-----------+----------+--------------+ FV Mid   Full           Yes      Yes                                 +---------+---------------+---------+-----------+----------+--------------+ FV DistalFull           Yes      Yes                                  +---------+---------------+---------+-----------+----------+--------------+ PFV      Full                                                        +---------+---------------+---------+-----------+----------+--------------+ POP      Full           Yes      Yes                                 +---------+---------------+---------+-----------+----------+--------------+ PTV      Full                                                        +---------+---------------+---------+-----------+----------+--------------+ PERO     Full                                                        +---------+---------------+---------+-----------+----------+--------------+ Surgical Bandage covering area of right groin.  Right  Technical Findings: Not visualized segments include CFV & SFJ.  +---------+---------------+---------+-----------+----------+--------------+ LEFT     CompressibilityPhasicitySpontaneityPropertiesThrombus Aging +---------+---------------+---------+-----------+----------+--------------+ CFV      Full           Yes      Yes                                 +---------+---------------+---------+-----------+----------+--------------+ SFJ      Full                                                        +---------+---------------+---------+-----------+----------+--------------+ FV Prox  Full           Yes      Yes                                 +---------+---------------+---------+-----------+----------+--------------+ FV Mid   Full           Yes      Yes                                 +---------+---------------+---------+-----------+----------+--------------+ FV DistalFull           Yes      Yes                                 +---------+---------------+---------+-----------+----------+--------------+ PFV      Full                                                         +---------+---------------+---------+-----------+----------+--------------+ POP      Full           Yes      Yes                                 +---------+---------------+---------+-----------+----------+--------------+ PTV      Full                                                        +---------+---------------+---------+-----------+----------+--------------+ PERO     Full                                                        +---------+---------------+---------+-----------+----------+--------------+     Summary: BILATERAL: -No evidence of popliteal cyst, bilaterally. RIGHT: - There is no evidence of deep vein thrombosis in the lower extremity. However, portions of this examination were limited- see technologist comments above.  LEFT: - There is no evidence of deep vein thrombosis in the lower extremity.  *See  table(s) above for measurements and observations. Electronically signed by Heath Lark on 11/03/2021 at 4:45:19 PM.    Final         Scheduled Meds:  acetaminophen  1,000 mg Oral Q8H   apixaban  5 mg Oral BID   bisacodyl  10 mg Rectal Once   cefadroxil  1,000 mg Oral BID   Chlorhexidine Gluconate Cloth  6 each Topical Daily   insulin aspart  0-9 Units Subcutaneous TID WC   lidocaine  1 patch Transdermal Q24H   polyethylene glycol  17 g Oral Daily   Continuous Infusions:  sodium chloride Stopped (10/31/21 1216)     LOS: 5 days    Time spent: over 30 min    Erick Blinks, MD [facility]   To contact the attending provider between 7A-7P or the covering provider during after hours 7P-7A, please log into the web site [email or web address] and access using universal [place] password for that web site. If you do not have the password, please call the hospital operator.  11/03/2021, 6:52 PM

## 2021-11-04 LAB — COMPREHENSIVE METABOLIC PANEL
ALT: 19 U/L (ref 0–44)
AST: 11 U/L — ABNORMAL LOW (ref 15–41)
Albumin: 3 g/dL — ABNORMAL LOW (ref 3.5–5.0)
Alkaline Phosphatase: 62 U/L (ref 38–126)
Anion gap: 9 (ref 5–15)
BUN: 26 mg/dL — ABNORMAL HIGH (ref 8–23)
CO2: 26 mmol/L (ref 22–32)
Calcium: 8.3 mg/dL — ABNORMAL LOW (ref 8.9–10.3)
Chloride: 105 mmol/L (ref 98–111)
Creatinine, Ser: 0.68 mg/dL (ref 0.44–1.00)
GFR, Estimated: >60 mL/min (ref >60)
Glucose, Bld: 157 mg/dL — ABNORMAL HIGH (ref 70–99)
Potassium: 3.5 mmol/L (ref 3.5–5.1)
Sodium: 140 mmol/L (ref 135–145)
Total Bilirubin: 1.7 mg/dL — ABNORMAL HIGH (ref 0.3–1.2)
Total Protein: 5.6 g/dL — ABNORMAL LOW (ref 6.5–8.1)

## 2021-11-04 LAB — GLUCOSE, CAPILLARY: Glucose-Capillary: 142 mg/dL — ABNORMAL HIGH (ref 70–99)

## 2021-11-04 LAB — CBC
HCT: 26.5 % — ABNORMAL LOW (ref 36.0–46.0)
Hemoglobin: 8.5 g/dL — ABNORMAL LOW (ref 12.0–15.0)
MCH: 30.4 pg (ref 26.0–34.0)
MCHC: 32.1 g/dL (ref 30.0–36.0)
MCV: 94.6 fL (ref 80.0–100.0)
Platelets: 459 10*3/uL — ABNORMAL HIGH (ref 150–400)
RBC: 2.8 MIL/uL — ABNORMAL LOW (ref 3.87–5.11)
RDW: 15.1 % (ref 11.5–15.5)
WBC: 13.3 10*3/uL — ABNORMAL HIGH (ref 4.0–10.5)
nRBC: 0.6 % — ABNORMAL HIGH (ref 0.0–0.2)

## 2021-11-04 MED ORDER — ACETAMINOPHEN 500 MG PO TABS
1000.0000 mg | ORAL_TABLET | Freq: Three times a day (TID) | ORAL | 0 refills | Status: DC | PRN
Start: 2021-11-04 — End: 2022-04-27

## 2021-11-04 MED ORDER — APIXABAN 5 MG PO TABS: 5.0000 mg | ORAL_TABLET | Freq: Two times a day (BID) | ORAL | 2 refills | Status: DC

## 2021-11-04 NOTE — Care Management Important Message (Signed)
Important Message  Patient Details IM Letter given to the Patient. Name: Monica Martin MRN: [id number] Date of Birth: [birth year]   Medicare Important Message Given:  Yes     Kerin Salen 11/04/2021, 10:12 AM

## 2021-11-04 NOTE — Progress Notes (Signed)
    Subjective:  Patient reports pain as mild to moderate.  Denies N/V/CP/SOB/Abd pain/Dizziness. Patient reports improvement in her pain and not requiring as much pain medicine. She is ready to go home and get back to her dogs. She reports ambulating to the bathroom without difficulty. Denies tingling and numbess in LE bilaterally.  Husband at bedside.   All questions solicited and answered.   Objective:   VITALS:   Vitals:   11/03/21 1923 11/03/21 1941 11/03/21 2314 11/04/21 0307  BP: (!) 172/69 (!) 160/72 (!) 153/73 (!) 149/76  Pulse: (!) 114 (!) 108 (!) 110 (!) 106  Resp: 18 16 18 20   Temp: (!) 97.4 F (36.3 C) 98 F (36.7 C) 98.2 F (36.8 C) 98 F (36.7 C)  TempSrc: Oral Oral    SpO2: 99% 97% 97% 95%  Weight:      Height:        Patient is sitting in recliner with husband at bedside. NAD. Neurologically intact ABD soft Neurovascular intact Sensation intact distally Intact pulses distally Dorsiflexion/Plantar flexion intact No cellulitis present Compartment soft Dressings: - Aquacel dressing intact, no drainage coming from incision site.  - Dressing just superior to aquacel dressing removed to check area where a blister formed and drained. She is still having drainage from the area. It was covered with xeroform then dry gauze and abd pad with tape.  - Another small fluid filled blister just inferior to aquacel dressing. No concerns for infection.  Lab Results  Component Value Date   WBC 13.3 (H) 11/04/2021   HGB 8.5 (L) 11/04/2021   HCT 26.5 (L) 11/04/2021   MCV 94.6 11/04/2021   PLT 459 (H) 11/04/2021   BMET    Component Value Date/Time   NA 140 11/04/2021 0622   K 3.5 11/04/2021 0622   CL 105 11/04/2021 0622   CO2 26 11/04/2021 0622   GLUCOSE 157 (H) 11/04/2021 0622   BUN 26 (H) 11/04/2021 0622   CREATININE 0.68 11/04/2021 0622   CALCIUM 8.3 (L) 11/04/2021 0622   GFRNONAA >60 11/04/2021 0622     Assessment/Plan:     Principal Problem:   S/P  total hip arthroplasty Active Problems:   ABLA (acute blood loss anemia)   Postoperative hemorrhagic shock   AKI (acute kidney injury) (HCC)   Leukocytosis   Prediabetes   Sinus tachycardia   Obesity (BMI 30-39.9)   Adrenal nodule (HCC)   Hypoalbuminemia   Acute pulmonary embolus (HCC)  ABLA. Hemoglobin 8.5. She has had 4 units PRBCs since admission.    Hypokalemia: Resolved potassium 3.5 today.    CT angio chest for pulmonary embolism was ruled negative 11/02/22. Addendum was made 11/02/21 with findings noted to have a single subsegmental filling defect to suggest right middle lobe pulmonary artery branch that may indicate PE. Eliquis 5mg  BID patient given 3 month supply.    WBAT with walker DVT ppx: Eliquis, SCDs, TEDS PO pain control: Medication already sent to her pharmacy. PT/OT: Patient has been ambulating with therapy.  Dispo: D/c home with HEP.    11/04/21, PA-C 11/04/2021, 10:30 AM  [facility]  [place] [address], [address], [place], [address] [place] Phone: [phone] [email or web address] Facebook  [phone]

## 2021-11-04 NOTE — Progress Notes (Signed)
Discharge teaching complete. Meds, diet, activity, follow up appointments, incision care reviewed and all questions answered. Copy of instructions given to patient and prescriptions sent to pharmacy. Patient discharged home via wheelchair with husband.

## 2021-11-04 NOTE — Progress Notes (Signed)
Occupational Therapy Treatment Patient Details Name: Monica Martin MRN: [id number] DOB: September 19, 1950 Today's Date: 11/04/2021   History of present illness 71 yo F hx OA hips presented to outpt surgical center 5/19 for planned hip arthroplasty. During case had EBL about 1L. Received crystalloid in resuscitation during case and did require pressors intra-op, but was able to wean off in PACU. Direct admit to [facility] floor then to ICU due to hypotensive and required epi gtt. Treated for small PE   OT comments  Pt supine in bed and agreeable to OT.  Patient completing bed mobility with supervision using gait belt leg lifter, transfers with min guard to supervision using RW and toileting with up to max assist from spouse for hygiene.  Patient educated on tub transfer bench for bathing, pt declined simulating practice as she is familiar with it from her daughter using it.  Progressing well, will continue to follow acutely but plans to dc home today with spouses assist as needed. No further questions or concerns.    Recommendations for follow up therapy are one component of a multi-disciplinary discharge planning process, led by the attending physician.  Recommendations may be updated based on patient status, additional functional criteria and insurance authorization.    Follow Up Recommendations  Follow physician's recommendations for discharge plan and follow up therapies    Assistance Recommended at Discharge Frequent or constant Supervision/Assistance  Patient can return home with the following  A lot of help with bathing/dressing/bathroom;Assistance with cooking/housework;Assist for transportation;Help with stairs or ramp for entrance;A little help with walking and/or transfers   Equipment Recommendations  Tub/shower bench    Recommendations for Other Services      Precautions / Restrictions Precautions Precautions: Fall Precaution Comments: monitor HR/ BP Restrictions Weight Bearing  Restrictions: No RLE Weight Bearing: Weight bearing as tolerated       Mobility Bed Mobility Overal bed mobility: Needs Assistance Bed Mobility: Supine to Sit, Sit to Supine     Supine to sit: Supervision Sit to supine: Supervision   General bed mobility comments: with use of gait belt as leg lifter, no physical assistance required    Transfers                         Balance Overall balance assessment: Needs assistance Sitting-balance support: No upper extremity supported, Feet supported Sitting balance-Leahy Scale: Good     Standing balance support: Bilateral upper extremity supported, During functional activity Standing balance-Leahy Scale: Fair Standing balance comment: BUE support on RW dynamically                           ADL either performed or assessed with clinical judgement   ADL Overall ADL's : Needs assistance/impaired     Grooming: Min guard;Supervision/safety;Standing       Lower Body Bathing: Moderate assistance;Sit to/from stand Lower Body Bathing Details (indicate cue type and reason): discussed techniques using long handled equipment.     Lower Body Dressing: Maximal assistance;Sit to/from stand Lower Body Dressing Details (indicate cue type and reason): patient reports plan to utilize reacher at home, educated on use of reacher for donning pants.  Reports will likely have spouse assist. Toilet Transfer: Min guard;Ambulation;Rolling walker (2 wheels);BSC/3in1 Toilet Transfer Details (indicate cue type and reason): for safety; 3:1 over commode Toileting- Clothing Manipulation and Hygiene: Maximal assistance;Sit to/from stand Toileting - Clothing Manipulation Details (indicate cue type and reason): for hygiene after +BM  Tub/Shower Transfer Details (indicate cue type and reason): verbally educated on use of tub transfer bench for bathing once cleared by MD. Patient denies needs to simulate as she is familiar with it from  daughter. Functional mobility during ADLs: Min guard;Rolling walker (2 wheels)      Extremity/Trunk Assessment              Vision       Perception     Praxis      Cognition Arousal/Alertness: Awake/alert Behavior During Therapy: WFL for tasks assessed/performed Overall Cognitive Status: Within Functional Limits for tasks assessed                                          Exercises      Shoulder Instructions       General Comments spouse present and supportive    Pertinent Vitals/ Pain       Pain Assessment Pain Assessment: Faces Faces Pain Scale: Hurts a little bit Pain Location: R thigh Pain Descriptors / Indicators: Dull, Sore Pain Intervention(s): Limited activity within patient's tolerance, Monitored during session, Repositioned  Home Living                                          Prior Functioning/Environment              Frequency  Min 2X/week        Progress Toward Goals  OT Goals(current goals can now be found in the care plan section)  Progress towards OT goals: Progressing toward goals  Acute Rehab OT Goals Patient Stated Goal: home to dogs OT Goal Formulation: With patient Time For Goal Achievement: 11/15/21 Potential to Achieve Goals: Good  Plan Discharge plan remains appropriate;Frequency remains appropriate    Co-evaluation                 AM-PAC OT "6 Clicks" Daily Activity     Outcome Measure   Help from another person eating meals?: A Little Help from another person taking care of personal grooming?: A Little Help from another person toileting, which includes using toliet, bedpan, or urinal?: A Lot Help from another person bathing (including washing, rinsing, drying)?: A Lot Help from another person to put on and taking off regular upper body clothing?: A Little Help from another person to put on and taking off regular lower body clothing?: A Lot 6 Click Score: 15    End  of Session Equipment Utilized During Treatment: Rolling walker (2 wheels)  OT Visit Diagnosis: Unsteadiness on feet (R26.81);Muscle weakness (generalized) (M62.81);Pain   Activity Tolerance Patient tolerated treatment well   Patient Left in bed;with call bell/phone within reach;with family/visitor present   Nurse Communication Mobility status        Time: 4665-9935 OT Time Calculation (min): 26 min  Charges: OT General Charges $OT Visit: 1 Visit OT Treatments $Self Care/Home Management : 23-37 mins  Barry Brunner, OT Acute Rehabilitation Services Pager [phone] Office [phone]   Chancy Milroy 11/04/2021, 11:59 AM

## 2021-11-04 NOTE — Discharge Summary (Cosign Needed)
Physician Discharge Summary  Patient ID: Monica Martin MRN: [id number] DOB/AGE: Jul 13, 1950 71 y.o.  Admit date: 10/28/2021 Discharge date: 11/04/2021  Admission Diagnoses:  S/P total hip arthroplasty  Discharge Diagnoses:  Principal Problem:   S/P total hip arthroplasty Active Problems:   ABLA (acute blood loss anemia)   Postoperative hemorrhagic shock   AKI (acute kidney injury) (HCC)   Leukocytosis   Prediabetes   Sinus tachycardia   Obesity (BMI 30-39.9)   Adrenal nodule (HCC)   Hypoalbuminemia   Acute pulmonary embolus (HCC)   Past Medical History:  Diagnosis Date   Hyperlipidemia    Osteoarthritis    Sciatica     Surgeries:  on * No surgery found *   Consultants (if any): Treatment Team:  Kathie Dike, MD  Discharged Condition: Improved  Hospital Course: Monica Martin is an 71 y.o. female who was admitted 10/28/2021 with a diagnosis of S/P total hip arthroplasty and went to the operating room on 10/28/21 at [facility] and underwent the above named procedures. During case had EBL about 1L. Received crystalloid in resuscitation during case and did require pressors intra-op, but was able to wean off in PACU. Direct admit to [facility] floor for obvs, however in transport, patient became hypotensive and required epi gtt.  5/19 admit to [facility] following hip surgery due to EBL. Subsequently requiring pressors. Transfer to ICU. She required 2 units of PRBCs due to hemoglobin 6.9. She required a central line that was placed.      She was given perioperative antibiotics:  Anti-infectives (From admission, onward)    Start     Dose/Rate Route Frequency Ordered Stop   10/30/21 1445  cefadroxil (DURICEF) capsule 1,000 mg        1,000 mg Oral 2 times daily 10/30/21 1423 11/03/21 2138       She was given sequential compression devices, early ambulation, and Lovenox for DVT prophylaxis.   10/29/21 POD#1 She was having issues with urinary retention and foley catheter was placed. Hemoglobin  was still low and she received 1 unit of PRBCs.   10/30/21 POD#2 Patient received 4th and final unit of PRBCs. Erythema was noted around incision site and she was started on Keflex.   5/22 POD#3 Patient was weaned off vasopressor support. She began working with PT but was limited due to increased HR and fatigue. Her catheter was removed today. She developed some blisters just superior to her incision likely due to increased swelling. Incision remained C/D/I without any signs of drainage or infection. Dry dressing was placed around aquacel dressing. Dressing changed as needed due to drainage. Hemoglobin continued to increase. She developed hypokalemia and was given 9mEq of kdur.   5/23 POD#4 Hemoglobin continued to improve. Labs still showed some mild hypokalemia and was given another 77mEq of kdur. PT continued to work with patient. She continued to have persistent sinus tachycardia despite blood. Hospitalist ordered CT PE and radiologist ruled out PE.   5/24 POD#5 Hemoglobin continued to improve at 8.7. Hypokalemia resolved. She again had issues with urinary retention and nursing staff placed catheter. Radiology reread CT chest report from 5/23 and addended to include possible right subsegmental PE. She was stared on Eliquis 5mg  BID. Therapeutic dose of Eliquis 10mg  dose was deemed inappropriate due to increased bleeding risk that was discussed with the hospitalist. Echocardiogram was ordered and was benign with preserved ejection fraction. Patient continued to work with therapy.  5/25 POD#6 Patient hemoglobin stable. Catheter was removed with no further issues.  Patient continued to work with PT. She was transferred from ICU to med surg.   5/26 POD#7  Patient was doing well hemoglobin stable. She had been ambulating well with PT. She was ready to go home. She was discharged on Lovenox 5mg  BID.   She benefited maximally from the hospital stay and there were no complications.    Recent vital signs:   Vitals:   11/03/21 2314 11/04/21 0307  BP: (!) 153/73 (!) 149/76  Pulse: (!) 110 (!) 106  Resp: 18 20  Temp: 98.2 F (36.8 C) 98 F (36.7 C)  SpO2: 97% 95%    Recent laboratory studies:  Lab Results  Component Value Date   HGB 8.5 (L) 11/04/2021   HGB 8.9 (L) 11/03/2021   HGB 8.7 (L) 11/02/2021   Lab Results  Component Value Date   WBC 13.3 (H) 11/04/2021   PLT 459 (H) 11/04/2021   Lab Results  Component Value Date   INR 1.6 (H) 10/28/2021   Lab Results  Component Value Date   NA 140 11/04/2021   K 3.5 11/04/2021   CL 105 11/04/2021   CO2 26 11/04/2021   BUN 26 (H) 11/04/2021   CREATININE 0.68 11/04/2021   GLUCOSE 157 (H) 11/04/2021     Allergies as of 11/04/2021   No Known Allergies      Medication List     STOP taking these medications    aspirin 81 MG chewable tablet   diclofenac 75 MG EC tablet Commonly known as: VOLTAREN       TAKE these medications    acetaminophen 500 MG tablet Commonly known as: TYLENOL Take 2 tablets (1,000 mg total) by mouth every 8 (eight) hours as needed for moderate pain, mild pain or fever. What changed:  when to take this reasons to take this   apixaban 5 MG Tabs tablet Commonly known as: Eliquis Take 1 tablet (5 mg total) by mouth 2 (two) times daily.   ARTIFICIAL TEARS OP Place 1 drop into both eyes 3 (three) times daily.   co-enzyme Q-10 30 MG capsule Take 30 mg by mouth daily.   cyclobenzaprine 10 MG tablet Commonly known as: FLEXERIL Take 10 mg by mouth daily as needed for muscle spasms.   fish oil-omega-3 fatty acids 1000 MG capsule Take 1 g by mouth daily.   fluticasone 50 MCG/ACT nasal spray Commonly known as: FLONASE Place 1 spray into both nostrils daily as needed for allergies or rhinitis.   glucosamine-chondroitin 500-400 MG tablet Take 1 tablet by mouth daily.   multivitamin with minerals tablet Take 1 tablet by mouth daily. centrum   multivitamin-lutein Caps capsule Take 1  capsule by mouth daily.   OVER THE COUNTER MEDICATION Take 1 tablet by mouth daily. Astragalus propinquus               Discharge Care Instructions  (From admission, onward)           Start     Ordered   11/04/21 0000  Weight bearing as tolerated        11/04/21 1020   11/04/21 0000  Change dressing       Comments: Do not change your aquacel dressing.  The dressing for the blisters. Use a non-stick gauze/bandage over the exposed area, with dry gauze and abd pad as needed for drainage. When no longer draining do not have to cover.   11/04/21 1020  WEIGHT BEARING   Weight bearing as tolerated with assist device (walker, cane, etc) as directed, use it as long as suggested by your surgeon or therapist, typically at least 4-6 weeks.   EXERCISES  Results after joint replacement surgery are often greatly improved when you follow the exercise, range of motion and muscle strengthening exercises prescribed by your doctor. Safety measures are also important to protect the joint from further injury. Any time any of these exercises cause you to have increased pain or swelling, decrease what you are doing until you are comfortable again and then slowly increase them. If you have problems or questions, call your caregiver or physical therapist for advice.   Rehabilitation is important following a joint replacement. After just a few days of immobilization, the muscles of the leg can become weakened and shrink (atrophy).  These exercises are designed to build up the tone and strength of the thigh and leg muscles and to improve motion. Often times heat used for twenty to thirty minutes before working out will loosen up your tissues and help with improving the range of motion but do not use heat for the first two weeks following surgery (sometimes heat can increase post-operative swelling).   These exercises can be done on a training (exercise) mat, on the floor, on a table or  on a bed. Use whatever works the best and is most comfortable for you.    Use music or television while you are exercising so that the exercises are a pleasant break in your day. This will make your life better with the exercises acting as a break in your routine that you can look forward to.   Perform all exercises about fifteen times, three times per day or as directed.  You should exercise both the operative leg and the other leg as well.  Exercises include:   Quad Sets - Tighten up the muscle on the front of the thigh (Quad) and hold for 5-10 seconds.   Straight Leg Raises - With your knee straight (if you were given a brace, keep it on), lift the leg to 60 degrees, hold for 3 seconds, and slowly lower the leg.  Perform this exercise against resistance later as your leg gets stronger.  Leg Slides: Lying on your back, slowly slide your foot toward your buttocks, bending your knee up off the floor (only go as far as is comfortable). Then slowly slide your foot back down until your leg is flat on the floor again.  Angel Wings: Lying on your back spread your legs to the side as far apart as you can without causing discomfort.  Hamstring Strength:  Lying on your back, push your heel against the floor with your leg straight by tightening up the muscles of your buttocks.  Repeat, but this time bend your knee to a comfortable angle, and push your heel against the floor.  You may put a pillow under the heel to make it more comfortable if necessary.   A rehabilitation program following joint replacement surgery can speed recovery and prevent re-injury in the future due to weakened muscles. Contact your doctor or a physical therapist for more information on knee rehabilitation.    CONSTIPATION  Constipation is defined medically as fewer than three stools per week and severe constipation as less than one stool per week.  Even if you have a regular bowel pattern at home, your normal regimen is likely to be  disrupted due to multiple reasons following surgery.  Combination of anesthesia, postoperative narcotics, change in appetite and fluid intake all can affect your bowels.   YOU MUST use at least one of the following options; they are listed in order of increasing strength to get the job done.  They are all available over the counter, and you may need to use some, POSSIBLY even all of these options:    Drink plenty of fluids (prune juice may be helpful) and high fiber foods Colace 100 mg by mouth twice a day  Senokot for constipation as directed and as needed Dulcolax (bisacodyl), take with full glass of water  Miralax (polyethylene glycol) once or twice a day as needed.  If you have tried all these things and are unable to have a bowel movement in the first 3-4 days after surgery call either your surgeon or your primary doctor.    If you experience loose stools or diarrhea, hold the medications until you stool forms back up.  If your symptoms do not get better within 1 week or if they get worse, check with your doctor.  If you experience "the worst abdominal pain ever" or develop nausea or vomiting, please contact the office immediately for further recommendations for treatment.   ITCHING:  If you experience itching with your medications, try taking only a single pain pill, or even half a pain pill at a time.  You can also use Benadryl over the counter for itching or also to help with sleep.   TED HOSE STOCKINGS:  Use stockings on both legs until for at least 2 weeks or as directed by physician office. They may be removed at night for sleeping.  MEDICATIONS:  See your medication summary on the "After Visit Summary" that nursing will review with you.  You may have some home medications which will be placed on hold until you complete the course of blood thinner medication.  It is important for you to complete the blood thinner medication as prescribed.  PRECAUTIONS:  If you experience chest pain or  shortness of breath - call 911 immediately for transfer to the hospital emergency department.   If you develop a fever greater that 101 F, purulent drainage from wound, increased redness or drainage from wound, foul odor from the wound/dressing, or calf pain - CONTACT YOUR SURGEON.                                                   FOLLOW-UP APPOINTMENTS:  If you do not already have a post-op appointment, please call the office for an appointment to be seen by your surgeon.  Guidelines for how soon to be seen are listed in your "After Visit Summary", but are typically between 1-4 weeks after surgery.   MAKE SURE YOU:  Understand these instructions.  Get help right away if you are not doing well or get worse.    Thank you for letting us be a part of your medical care team.  It is a privilege we respect greatly.  We hope these instructions will help you stay on track for a fast and full recovery!   Diagnostic Studies: CT Angio Chest Pulmonary Embolism (PE) W or WO Contrast  Addendum Date: 11/02/2021   ADDENDUM REPORT: 11/02/2021 15:42 ADDENDUM: Upon re-review of CT chest 11/01/2021, the following findings are noted: A single subsegmental filling defect is suggested in  a right middle lobe pulmonary artery branch which may indicate a pulmonary embolus. Electronically Signed   By: Lucienne Capers M.D.   On: 11/02/2021 15:42   Result Date: 11/02/2021 CLINICAL DATA:  Pulmonary embolus suspected with unknown D-dimer. EXAM: CT ANGIOGRAPHY CHEST WITH CONTRAST TECHNIQUE: Multidetector CT imaging of the chest was performed using the standard protocol during bolus administration of intravenous contrast. Multiplanar CT image reconstructions and MIPs were obtained to evaluate the vascular anatomy. RADIATION DOSE REDUCTION: This exam was performed according to the departmental dose-optimization program which includes automated exposure control, adjustment of the mA and/or kV according to patient size and/or use of  iterative reconstruction technique. CONTRAST:  71mL OMNIPAQUE IOHEXOL 350 MG/ML SOLN COMPARISON:  Chest radiograph 10/28/2021 FINDINGS: Cardiovascular: Examination is technically limited due to poor contrast bolus and motion artifact. However, there is moderately good opacification of the central and proximal segmental pulmonary arteries. No focal filling defects are demonstrated in these visualized vessels. No evidence of significant central pulmonary embolus. Normal heart size. No pericardial effusions. Normal caliber thoracic aorta. Calcification of the aorta. Mediastinum/Nodes: Esophagus is decompressed. Small esophageal hiatal hernia. No significant lymphadenopathy. Thyroid gland is unremarkable. Lungs/Pleura: Motion artifact limits the examination. There are areas of linear atelectasis in the right mid and both lower lungs. No focal consolidation. No pleural effusions. No pneumothorax. Upper Abdomen: Diffuse fatty infiltration of the liver. Focal circumscribed low-attenuation lesions in the lateral segment left lobe, largest measuring 3.6 cm diameter. Lesions are cystic by density measurements. The gallbladder is distended with apparent gallbladder wall thickening and adjacent stranding. No stones are identified but the appearance is suspicious for acute cholecystitis. 13 mm left adrenal gland nodule. Density measures of 21 Hounsfield units in the contrast-enhanced study. Right adrenal gland nodule measuring 1.5 cm diameter. Twenty-nine Hounsfield units on a contrast enhanced study. Infiltration of subcutaneous fat of the right flank may indicate contusion or inflammatory process. No loculated collections. Musculoskeletal: Degenerative changes in the spine. No destructive bone lesions. Review of the MIP images confirms the above findings. IMPRESSION: 1. Technically limited examination but no evidence for significant central pulmonary embolus. 2. Bilateral adrenal gland nodules. 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 Recommend 1 year follow up adrenal washout CT. If stable for > 1 year, no further f/u imaging. JACR 2017 Aug; 14(8):1038-44, JCAT 2016 Mar-Apr; 40(2):194-200, Urol J 2006 Spring; 3(2):71-4. 3. No evidence of active pulmonary disease. 4. Diffuse fatty infiltration of the liver. 5. Thick-walled distended gallbladder with inflammatory stranding suggesting cholecystitis. No stones are identified by CT. 6. Infiltration in the subcutaneous fat over the right flank may demonstrate contusion or inflammatory process. No loculation. Electronically Signed: By: Lucienne Capers M.D. On: 11/01/2021 22:13   DG CHEST PORT 1 VIEW  Result Date: 10/28/2021 CLINICAL DATA:  Central line placement EXAM: PORTABLE CHEST 1 VIEW COMPARISON:  12/04/2011, 03/22/2021 FINDINGS: Right IJ central venous catheter with distal tip terminating at the level of the distal SVC. Heart size is normal. Elevation of the right hemidiaphragm. No focal airspace consolidation, pleural effusion, or pneumothorax. IMPRESSION: Right IJ central venous catheter with distal tip terminating at the level of the distal SVC. No pneumothorax. Electronically Signed   By: Davina Poke D.O.   On: 10/28/2021 19:59   ECHOCARDIOGRAM COMPLETE  Result Date: 11/02/2021    ECHOCARDIOGRAM REPORT   Patient Name:   GRISSEL PARSHALL Date of Exam: 11/02/2021 Medical Rec #:  [id number]      Height:  66.0 in Accession #:    [id number]     Weight:       220.0 lb Date of Birth:  December 13, 1950       BSA:          2.083 m Patient Age:    74 years       BP:           147/77 mmHg Patient Gender: F              HR:           114 bpm. Exam Location:  Inpatient Procedure: 2D Echo, Cardiac Doppler and Color Doppler Indications:    Other abnormalities of the heart R00.8  History:        Patient has no prior history of Echocardiogram examinations.                 Risk Factors:Dyslipidemia.  Sonographer:    Bernadene Person RDCS Referring Phys: [phone] A  [place]  1. Left ventricular ejection fraction, by estimation, is 60 to 65%. The left ventricle has normal function. The left ventricle has no regional wall motion abnormalities. Left ventricular diastolic parameters are consistent with Grade I diastolic dysfunction (impaired relaxation).  2. Right ventricular systolic function is normal. The right ventricular size is normal. There is mildly elevated pulmonary artery systolic pressure. The estimated right ventricular systolic pressure is [id number] mmHg.  3. The mitral valve is normal in structure. Trivial mitral valve regurgitation. No evidence of mitral stenosis.  4. The aortic valve is tricuspid. There is mild calcification of the aortic valve. Aortic valve regurgitation is not visualized. No aortic stenosis is present.  5. The inferior vena cava is normal in size with greater than 50% respiratory variability, suggesting right atrial pressure of 3 mmHg.  6. 3 cm liver cyst noted. FINDINGS  Left Ventricle: Left ventricular ejection fraction, by estimation, is 60 to 65%. The left ventricle has normal function. The left ventricle has no regional wall motion abnormalities. The left ventricular internal cavity size was normal in size. There is  no left ventricular hypertrophy. Left ventricular diastolic parameters are consistent with Grade I diastolic dysfunction (impaired relaxation). Right Ventricle: The right ventricular size is normal. No increase in right ventricular wall thickness. Right ventricular systolic function is normal. There is mildly elevated pulmonary artery systolic pressure. The tricuspid regurgitant velocity is 3.18  m/s, and with an assumed right atrial pressure of 3 mmHg, the estimated right ventricular systolic pressure is [id number] mmHg. Left Atrium: Left atrial size was normal in size. Right Atrium: Right atrial size was normal in size. Pericardium: There is no evidence of pericardial effusion. Mitral Valve: The mitral valve is  normal in structure. Mild mitral annular calcification. Trivial mitral valve regurgitation. No evidence of mitral valve stenosis. Tricuspid Valve: The tricuspid valve is normal in structure. Tricuspid valve regurgitation is trivial. Aortic Valve: The aortic valve is tricuspid. There is mild calcification of the aortic valve. Aortic valve regurgitation is not visualized. No aortic stenosis is present. Pulmonic Valve: The pulmonic valve was normal in structure. Pulmonic valve regurgitation is not visualized. Aorta: The aortic root is normal in size and structure. Venous: The inferior vena cava is normal in size with greater than 50% respiratory variability, suggesting right atrial pressure of 3 mmHg. IAS/Shunts: No atrial level shunt detected by color flow Doppler.  LEFT VENTRICLE PLAX 2D LVIDd:         3.90 cm  Diastology LVIDs:         2.70 cm     LV e' medial:    8.68 cm/s LV PW:         1.00 cm     LV E/e' medial:  10.8 LV IVS:        0.90 cm     LV e' lateral:   9.65 cm/s LVOT diam:     2.00 cm     LV E/e' lateral: 9.8 LV SV:         85 LV SV Index:   41 LVOT Area:     3.14 cm  LV Volumes (MOD) LV vol d, MOD A2C: 76.6 ml LV vol d, MOD A4C: 62.6 ml LV vol s, MOD A2C: 25.5 ml LV vol s, MOD A4C: 21.4 ml LV SV MOD A2C:     51.1 ml LV SV MOD A4C:     62.6 ml LV SV MOD BP:      48.0 ml RIGHT VENTRICLE RV S prime:     16.50 cm/s TAPSE (M-mode): 1.9 cm LEFT ATRIUM             Index        RIGHT ATRIUM           Index LA diam:        3.70 cm 1.78 cm/m   RA Area:     12.70 cm LA Vol (A2C):   37.6 ml 18.05 ml/m  RA Volume:   28.20 ml  13.54 ml/m LA Vol (A4C):   45.1 ml 21.66 ml/m LA Biplane Vol: 41.7 ml 20.02 ml/m  AORTIC VALVE LVOT Vmax:   149.00 cm/s LVOT Vmean:  99.000 cm/s LVOT VTI:    0.270 m  AORTA Ao Root diam: 3.20 cm Ao Asc diam:  2.70 cm MITRAL VALVE                TRICUSPID VALVE MV Area (PHT): 5.23 cm     TR Peak grad:   40.4 mmHg MV Decel Time: 145 msec     TR Vmax:        318.00 cm/s MV E velocity:  94.10 cm/s MV A velocity: 104.00 cm/s  SHUNTS MV E/A ratio:  0.90         Systemic VTI:  0.27 m                             Systemic Diam: 2.00 cm Dalton McleanMD Electronically signed by Franki Monte Signature Date/Time: 11/02/2021/5:01:25 PM    Final    VAS [place] LOWER EXTREMITY VENOUS (DVT)  Result Date: 11/03/2021  Lower Venous DVT Study Patient Name:  NYMERIA MAHANEY  Date of Exam:   11/03/2021 Medical Rec #: [id number]       Accession #:    [id number] Date of Birth: 1951/01/04        Patient Gender: F Patient Age:   78 years Exam Location[facility]  [facility] Procedure:      VAS [place] LOWER EXTREMITY VENOUS (DVT) Referring Phys: Jolaine Artist MEMON --------------------------------------------------------------------------------  Indications: Pulmonary embolism. Other Indications: S/P RLE total hip arthroplasty. Limitations: Body habitus, poor ultrasound/tissue interface and bandages. Comparison Study: No previous exams Performing Technologist: Jody Daishia Fetterly RVT, RDMS  Examination Guidelines: A complete evaluation includes B-mode imaging, spectral Doppler, color Doppler, and power Doppler as needed of all accessible portions of each vessel. Bilateral testing is considered an integral part of a  complete examination. Limited examinations for reoccurring indications may be performed as noted. The reflux portion of the exam is performed with the patient in reverse Trendelenburg.  +---------+---------------+---------+-----------+----------+--------------+ RIGHT    CompressibilityPhasicitySpontaneityPropertiesThrombus Aging +---------+---------------+---------+-----------+----------+--------------+ CFV                                                   Not visualized +---------+---------------+---------+-----------+----------+--------------+ SFJ                                                   Not visualized +---------+---------------+---------+-----------+----------+--------------+ FV Prox  Full            Yes      Yes                                 +---------+---------------+---------+-----------+----------+--------------+ FV Mid   Full           Yes      Yes                                 +---------+---------------+---------+-----------+----------+--------------+ FV DistalFull           Yes      Yes                                 +---------+---------------+---------+-----------+----------+--------------+ PFV      Full                                                        +---------+---------------+---------+-----------+----------+--------------+ POP      Full           Yes      Yes                                 +---------+---------------+---------+-----------+----------+--------------+ PTV      Full                                                        +---------+---------------+---------+-----------+----------+--------------+ PERO     Full                                                        +---------+---------------+---------+-----------+----------+--------------+ Surgical Bandage covering area of right groin.  Right Technical Findings: Not visualized segments include CFV & SFJ.  +---------+---------------+---------+-----------+----------+--------------+ LEFT     CompressibilityPhasicitySpontaneityPropertiesThrombus Aging +---------+---------------+---------+-----------+----------+--------------+ CFV      Full           Yes  Yes                                 +---------+---------------+---------+-----------+----------+--------------+ SFJ      Full                                                        +---------+---------------+---------+-----------+----------+--------------+ FV Prox  Full           Yes      Yes                                 +---------+---------------+---------+-----------+----------+--------------+ FV Mid   Full           Yes      Yes                                  +---------+---------------+---------+-----------+----------+--------------+ FV DistalFull           Yes      Yes                                 +---------+---------------+---------+-----------+----------+--------------+ PFV      Full                                                        +---------+---------------+---------+-----------+----------+--------------+ POP      Full           Yes      Yes                                 +---------+---------------+---------+-----------+----------+--------------+ PTV      Full                                                        +---------+---------------+---------+-----------+----------+--------------+ PERO     Full                                                        +---------+---------------+---------+-----------+----------+--------------+     Summary: BILATERAL: -No evidence of popliteal cyst, bilaterally. RIGHT: - There is no evidence of deep vein thrombosis in the lower extremity. However, portions of this examination were limited- see technologist comments above.  LEFT: - There is no evidence of deep vein thrombosis in the lower extremity.  *See table(s) above for measurements and observations. Electronically signed by Jamelle Haring on 11/03/2021 at 4:45:19 PM.    Final     Disposition: Discharge disposition: 01-Home or Self Care       Discharge Instructions  Call MD / Call 911   Complete by: As directed    If you experience chest pain or shortness of breath, CALL 911 and be transported to the hospital emergency room.  If you develope a fever above 101 F, pus (white drainage) or increased drainage or redness at the wound, or calf pain, call your surgeon's office.   Change dressing   Complete by: As directed    Do not change your aquacel dressing.  The dressing for the blisters. Use a non-stick gauze/bandage over the exposed area, with dry gauze and abd pad as needed for drainage. When no longer draining do  not have to cover.   Constipation Prevention   Complete by: As directed    Drink plenty of fluids.  Prune juice may be helpful.  You may use a stool softener, such as Colace (over the counter) 100 mg twice a day.  Use MiraLax (over the counter) for constipation as needed.   Diet - low sodium heart healthy   Complete by: As directed    Discharge instructions   Complete by: As directed    Elevate toes above nose. Use cryotherapy as needed for pain and swelling.   Driving restrictions   Complete by: As directed    No driving for 6 weeks   Increase activity slowly as tolerated   Complete by: As directed    Lifting restrictions   Complete by: As directed    No lifting for 6 weeks   Post-operative opioid taper instructions:   Complete by: As directed    POST-OPERATIVE OPIOID TAPER INSTRUCTIONS: It is important to wean off of your opioid medication as soon as possible. If you do not need pain medication after your surgery it is ok to stop day one. Opioids include: Codeine, Hydrocodone(Norco, Vicodin), Oxycodone(Percocet, oxycontin) and hydromorphone amongst others.  Long term and even short term use of opiods can cause: Increased pain response Dependence Constipation Depression Respiratory depression And more.  Withdrawal symptoms can include Flu like symptoms Nausea, vomiting And more Techniques to manage these symptoms Hydrate well Eat regular healthy meals Stay active Use relaxation techniques(deep breathing, meditating, yoga) Do Not substitute Alcohol to help with tapering If you have been on opioids for less than two weeks and do not have pain than it is ok to stop all together.  Plan to wean off of opioids This plan should start within one week post op of your joint replacement. Maintain the same interval or time between taking each dose and first decrease the dose.  Cut the total daily intake of opioids by one tablet each day Next start to increase the time between  doses. The last dose that should be eliminated is the evening dose.      TED hose   Complete by: As directed    Use stockings (TED hose) for 2 weeks on both leg(s).  You may remove them at night for sleeping.   Weight bearing as tolerated   Complete by: As directed           Signed: Charlott Rakes, PA-C 11/04/2021, 2:43 PM

## 2021-11-05 ENCOUNTER — Other Ambulatory Visit: Payer: Self-pay

## 2021-11-05 ENCOUNTER — Emergency Department (HOSPITAL_COMMUNITY): Payer: Medicare HMO

## 2021-11-05 ENCOUNTER — Inpatient Hospital Stay (HOSPITAL_COMMUNITY)
Admission: EM | Admit: 2021-11-05 | Discharge: 2021-11-09 | DRG: 920 | Disposition: A | Discharge status: Home / Self Care | Payer: Medicare HMO | Attending: Internal Medicine | Admitting: Internal Medicine

## 2021-11-05 ENCOUNTER — Encounter (HOSPITAL_COMMUNITY): Payer: Self-pay

## 2021-11-05 ENCOUNTER — Observation Stay (HOSPITAL_COMMUNITY): Payer: Medicare HMO

## 2021-11-05 DIAGNOSIS — Z471 Aftercare following joint replacement surgery: Secondary | ICD-10-CM | POA: Diagnosis not present

## 2021-11-05 DIAGNOSIS — M25572 Pain in left ankle and joints of left foot: Secondary | ICD-10-CM | POA: Diagnosis not present

## 2021-11-05 DIAGNOSIS — E669 Obesity, unspecified: Secondary | ICD-10-CM

## 2021-11-05 DIAGNOSIS — D649 Anemia, unspecified: Secondary | ICD-10-CM | POA: Diagnosis not present

## 2021-11-05 DIAGNOSIS — D62 Acute posthemorrhagic anemia: Secondary | ICD-10-CM | POA: Diagnosis present

## 2021-11-05 DIAGNOSIS — Z6833 Body mass index (BMI) 33.0-33.9, adult: Secondary | ICD-10-CM | POA: Diagnosis not present

## 2021-11-05 DIAGNOSIS — R7303 Prediabetes: Secondary | ICD-10-CM | POA: Diagnosis not present

## 2021-11-05 DIAGNOSIS — M25551 Pain in right hip: Secondary | ICD-10-CM | POA: Diagnosis not present

## 2021-11-05 DIAGNOSIS — Y793 Surgical instruments, materials and orthopedic devices (including sutures) associated with adverse incidents: Secondary | ICD-10-CM | POA: Diagnosis present

## 2021-11-05 DIAGNOSIS — M96840 Postprocedural hematoma of a musculoskeletal structure following a musculoskeletal system procedure: Secondary | ICD-10-CM | POA: Diagnosis present

## 2021-11-05 DIAGNOSIS — M1990 Unspecified osteoarthritis, unspecified site: Secondary | ICD-10-CM | POA: Diagnosis present

## 2021-11-05 DIAGNOSIS — Y838 Other surgical procedures as the cause of abnormal reaction of the patient, or of later complication, without mention of misadventure at the time of the procedure: Secondary | ICD-10-CM | POA: Diagnosis present

## 2021-11-05 DIAGNOSIS — Z9889 Other specified postprocedural states: Secondary | ICD-10-CM

## 2021-11-05 DIAGNOSIS — R739 Hyperglycemia, unspecified: Secondary | ICD-10-CM

## 2021-11-05 DIAGNOSIS — M79604 Pain in right leg: Secondary | ICD-10-CM

## 2021-11-05 DIAGNOSIS — Z86711 Personal history of pulmonary embolism: Secondary | ICD-10-CM

## 2021-11-05 DIAGNOSIS — Z5189 Encounter for other specified aftercare: Secondary | ICD-10-CM

## 2021-11-05 DIAGNOSIS — R231 Pallor: Secondary | ICD-10-CM | POA: Diagnosis not present

## 2021-11-05 DIAGNOSIS — I2699 Other pulmonary embolism without acute cor pulmonale: Secondary | ICD-10-CM | POA: Diagnosis not present

## 2021-11-05 DIAGNOSIS — R710 Precipitous drop in hematocrit: Secondary | ICD-10-CM | POA: Diagnosis not present

## 2021-11-05 DIAGNOSIS — R000 Tachycardia, unspecified: Secondary | ICD-10-CM

## 2021-11-05 DIAGNOSIS — I959 Hypotension, unspecified: Secondary | ICD-10-CM | POA: Diagnosis not present

## 2021-11-05 DIAGNOSIS — S8991XA Unspecified injury of right lower leg, initial encounter: Secondary | ICD-10-CM | POA: Diagnosis not present

## 2021-11-05 DIAGNOSIS — Z96649 Presence of unspecified artificial hip joint: Secondary | ICD-10-CM

## 2021-11-05 DIAGNOSIS — I10 Essential (primary) hypertension: Secondary | ICD-10-CM | POA: Diagnosis not present

## 2021-11-05 DIAGNOSIS — Z7901 Long term (current) use of anticoagulants: Secondary | ICD-10-CM | POA: Diagnosis not present

## 2021-11-05 DIAGNOSIS — Z79899 Other long term (current) drug therapy: Secondary | ICD-10-CM

## 2021-11-05 DIAGNOSIS — Z96641 Presence of right artificial hip joint: Secondary | ICD-10-CM

## 2021-11-05 DIAGNOSIS — M7989 Other specified soft tissue disorders: Secondary | ICD-10-CM | POA: Diagnosis not present

## 2021-11-05 DIAGNOSIS — R079 Chest pain, unspecified: Secondary | ICD-10-CM | POA: Diagnosis not present

## 2021-11-05 DIAGNOSIS — E785 Hyperlipidemia, unspecified: Secondary | ICD-10-CM | POA: Diagnosis present

## 2021-11-05 LAB — COMPREHENSIVE METABOLIC PANEL
ALT: 25 U/L (ref 0–44)
AST: 18 U/L (ref 15–41)
Albumin: 2.9 g/dL — ABNORMAL LOW (ref 3.5–5.0)
Alkaline Phosphatase: 59 U/L (ref 38–126)
Anion gap: 10 (ref 5–15)
BUN: 38 mg/dL — ABNORMAL HIGH (ref 8–23)
CO2: 24 mmol/L (ref 22–32)
Calcium: 8.1 mg/dL — ABNORMAL LOW (ref 8.9–10.3)
Chloride: 102 mmol/L (ref 98–111)
Creatinine, Ser: 0.88 mg/dL (ref 0.44–1.00)
GFR, Estimated: >60 mL/min (ref >60)
Glucose, Bld: 239 mg/dL — ABNORMAL HIGH (ref 70–99)
Potassium: 3.3 mmol/L — ABNORMAL LOW (ref 3.5–5.1)
Sodium: 136 mmol/L (ref 135–145)
Total Bilirubin: 1.9 mg/dL — ABNORMAL HIGH (ref 0.3–1.2)
Total Protein: 5.3 g/dL — ABNORMAL LOW (ref 6.5–8.1)

## 2021-11-05 LAB — TROPONIN I (HIGH SENSITIVITY)
Troponin I (High Sensitivity): 5 ng/L (ref <18)
Troponin I (High Sensitivity): 5 ng/L (ref <18)

## 2021-11-05 LAB — MAGNESIUM: Magnesium: 2.4 mg/dL (ref 1.7–2.4)

## 2021-11-05 LAB — CBC WITH DIFFERENTIAL/PLATELET
Abs Immature Granulocytes: 0.79 10*3/uL — ABNORMAL HIGH (ref 0.00–0.07)
Basophils Absolute: 0.1 10*3/uL (ref 0.0–0.1)
Basophils Relative: 0 %
Eosinophils Absolute: 0.1 10*3/uL (ref 0.0–0.5)
Eosinophils Relative: 1 %
HCT: 21 % — ABNORMAL LOW (ref 36.0–46.0)
Hemoglobin: 6.7 g/dL — CL (ref 12.0–15.0)
Immature Granulocytes: 5 %
Lymphocytes Relative: 9 %
Lymphs Abs: 1.5 10*3/uL (ref 0.7–4.0)
MCH: 30.6 pg (ref 26.0–34.0)
MCHC: 31.9 g/dL (ref 30.0–36.0)
MCV: 95.9 fL (ref 80.0–100.0)
Monocytes Absolute: 1.1 10*3/uL — ABNORMAL HIGH (ref 0.1–1.0)
Monocytes Relative: 7 %
Neutro Abs: 12.7 10*3/uL — ABNORMAL HIGH (ref 1.7–7.7)
Neutrophils Relative %: 78 %
Platelets: 539 10*3/uL — ABNORMAL HIGH (ref 150–400)
RBC: 2.19 MIL/uL — ABNORMAL LOW (ref 3.87–5.11)
RDW: 15.4 % (ref 11.5–15.5)
WBC: 16.3 10*3/uL — ABNORMAL HIGH (ref 4.0–10.5)
nRBC: 1.1 % — ABNORMAL HIGH (ref 0.0–0.2)

## 2021-11-05 LAB — GLUCOSE, CAPILLARY: Glucose-Capillary: 157 mg/dL — ABNORMAL HIGH (ref 70–99)

## 2021-11-05 LAB — CBG MONITORING, ED
Glucose-Capillary: 233 mg/dL — ABNORMAL HIGH (ref 70–99)
Glucose-Capillary: 213 mg/dL — ABNORMAL HIGH (ref 70–99)
Glucose-Capillary: 171 mg/dL — ABNORMAL HIGH (ref 70–99)

## 2021-11-05 LAB — PROTIME-INR
INR: 1.3 — ABNORMAL HIGH (ref 0.8–1.2)
Prothrombin Time: 15.6 seconds — ABNORMAL HIGH (ref 11.4–15.2)

## 2021-11-05 LAB — PREPARE RBC (CROSSMATCH)

## 2021-11-05 MED ORDER — ORAL CARE MOUTH RINSE
15.0000 mL | Freq: Two times a day (BID) | OROMUCOSAL | Status: DC
  Administered 2021-11-06 – 2021-11-08 (×6): 15 mL via OROMUCOSAL

## 2021-11-05 MED ORDER — CHLORHEXIDINE GLUCONATE CLOTH 2 % EX PADS
6.0000 | MEDICATED_PAD | Freq: Every day | CUTANEOUS | Status: DC
  Administered 2021-11-05 – 2021-11-08 (×4): 6 via TOPICAL

## 2021-11-05 MED ORDER — ADULT MULTIVITAMIN W/MINERALS CH
1.0000 | ORAL_TABLET | Freq: Every day | ORAL | Status: DC
  Administered 2021-11-05 – 2021-11-09 (×4): 1 via ORAL
  Filled 2021-11-05 (×5): qty 1

## 2021-11-05 MED ORDER — ONDANSETRON HCL 4 MG PO TABS: 4.0000 mg | ORAL_TABLET | Freq: Four times a day (QID) | ORAL | Status: DC | PRN

## 2021-11-05 MED ORDER — ONDANSETRON HCL 4 MG/2ML IJ SOLN
4.0000 mg | Freq: Once | INTRAMUSCULAR | Status: AC
  Administered 2021-11-05: 4 mg via INTRAVENOUS
  Filled 2021-11-05: qty 2

## 2021-11-05 MED ORDER — ONDANSETRON HCL 4 MG/2ML IJ SOLN
4.0000 mg | Freq: Four times a day (QID) | INTRAMUSCULAR | Status: DC | PRN
  Administered 2021-11-06: 4 mg via INTRAVENOUS
  Filled 2021-11-05: qty 2

## 2021-11-05 MED ORDER — ACETAMINOPHEN 325 MG PO TABS
650.0000 mg | ORAL_TABLET | Freq: Four times a day (QID) | ORAL | Status: DC | PRN
  Administered 2021-11-07: 650 mg via ORAL
  Filled 2021-11-05: qty 2

## 2021-11-05 MED ORDER — SODIUM CHLORIDE 0.9 % IV SOLN: INTRAVENOUS | Status: DC

## 2021-11-05 MED ORDER — ACETAMINOPHEN 650 MG RE SUPP: 650.0000 mg | Freq: Four times a day (QID) | RECTAL | Status: DC | PRN

## 2021-11-05 MED ORDER — POTASSIUM CHLORIDE CRYS ER 20 MEQ PO TBCR
40.0000 meq | EXTENDED_RELEASE_TABLET | Freq: Once | ORAL | Status: AC
  Administered 2021-11-05: 40 meq via ORAL
  Filled 2021-11-05: qty 2

## 2021-11-05 MED ORDER — FENTANYL CITRATE PF 50 MCG/ML IJ SOSY: 50.0000 ug | PREFILLED_SYRINGE | INTRAMUSCULAR | Status: DC | PRN

## 2021-11-05 MED ORDER — IOHEXOL 300 MG/ML  SOLN
100.0000 mL | Freq: Once | INTRAMUSCULAR | Status: AC | PRN
  Administered 2021-11-05: 100 mL via INTRAVENOUS

## 2021-11-05 MED ORDER — FENTANYL CITRATE PF 50 MCG/ML IJ SOSY
100.0000 ug | PREFILLED_SYRINGE | INTRAMUSCULAR | Status: DC | PRN
  Administered 2021-11-05 (×2): 100 ug via INTRAVENOUS
  Filled 2021-11-05 (×2): qty 2

## 2021-11-05 MED ORDER — FLUTICASONE PROPIONATE 50 MCG/ACT NA SUSP
1.0000 | Freq: Every day | NASAL | Status: DC | PRN
  Filled 2021-11-05: qty 16

## 2021-11-05 MED ORDER — INSULIN ASPART 100 UNIT/ML IJ SOLN
0.0000 [IU] | INTRAMUSCULAR | Status: DC
  Administered 2021-11-05 – 2021-11-06 (×4): 2 [IU] via SUBCUTANEOUS
  Administered 2021-11-06: 1 [IU] via SUBCUTANEOUS
  Administered 2021-11-06: 2 [IU] via SUBCUTANEOUS
  Filled 2021-11-05: qty 0.09

## 2021-11-05 MED ORDER — CYCLOBENZAPRINE HCL 10 MG PO TABS
10.0000 mg | ORAL_TABLET | Freq: Every day | ORAL | Status: DC | PRN
  Administered 2021-11-07: 10 mg via ORAL
  Filled 2021-11-05: qty 1

## 2021-11-05 NOTE — ED Notes (Signed)
Patient place on 2 of oxygen. Sat drop to 85 % after pain medication.

## 2021-11-05 NOTE — Assessment & Plan Note (Signed)
Without DM diagnosis (stress response?) A1C 6.0 recently 1. Sensitive SSI Q4H for the moment.

## 2021-11-05 NOTE — ED Provider Notes (Signed)
[facility]-EMERGENCY DEPT Provider Note   CSN: [id number] Arrival date & time: 11/05/21  1605     History  No chief complaint on file.   Monica Martin is a 71 y.o. female.  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  She had a complicated postop course but was ultimately discharged home.  During the postop course she required 4 units of blood transfusion, was admitted to the ICU and managed by critical care with hypotension requiring vasopressors.  She was diagnosed with PE and placed on oral anticoagulant.  Today she was walking back to her bedroom after being up for several hours, when she experienced sudden pain in her entire right leg, and felt faint and had both chest pain and shortness of breath.  She was able to get back to her bed, where she laid down and EMS was then summoned to transfer her here for evaluation.  She had been apparently using low-dose hydrocodone, one half of a 5 mg tablet, with Tylenol, sporadically.  She has been supplementing this with extra Tylenol when she takes it.  She was just    Home Medications Prior to Admission medications   Medication Sig Start Date End Date Taking? Authorizing Provider  acetaminophen (TYLENOL) 500 MG tablet Take 2 tablets (1,000 mg total) by mouth every 8 (eight) hours as needed for moderate pain, mild pain or fever. 11/04/21   Hill, Alain HoneyAvery S, PA-C  apixaban (ELIQUIS) 5 MG TABS tablet Take 1 tablet (5 mg total) by mouth 2 (two) times daily. 11/04/21 02/02/22  Clois DupesHill, Avery S, PA-C  Carboxymethylcellulose Sodium (ARTIFICIAL TEARS OP) Place 1 drop into both eyes 3 (three) times daily.    [provider]  co-enzyme Q-10 30 MG capsule Take 30 mg by mouth daily.    [provider]  cyclobenzaprine (FLEXERIL) 10 MG tablet Take 10 mg by mouth daily as needed for muscle spasms. 04/29/13   [provider]  fish oil-omega-3 fatty acids 1000 MG capsule Take 1 g by  mouth daily.     [provider]  fluticasone (FLONASE) 50 MCG/ACT nasal spray Place 1 spray into both nostrils daily as needed for allergies or rhinitis.    [provider]  glucosamine-chondroitin 500-400 MG tablet Take 1 tablet by mouth daily.    [provider]  Multiple Vitamins-Minerals (MULTIVITAMIN WITH MINERALS) tablet Take 1 tablet by mouth daily. centrum    [provider]  multivitamin-lutein (OCUVITE-LUTEIN) CAPS capsule Take 1 capsule by mouth daily.    [provider]  OVER THE COUNTER MEDICATION Take 1 tablet by mouth daily. Astragalus propinquus    [provider]      Allergies    Patient has no known allergies.    Review of Systems   Review of Systems  Physical Exam Updated Vital Signs BP 131/65   Pulse (!) 102   Temp (!) 97.4 F (36.3 C) (Oral)   Resp 18   SpO2 100%  Physical Exam  ED Results / Procedures / Treatments   Labs (all labs ordered are listed, but only abnormal results are displayed) Labs Reviewed  COMPREHENSIVE METABOLIC PANEL - Abnormal; Notable for the following components:      Result Value   Potassium 3.3 (*)    Glucose, Bld 239 (*)    BUN 38 (*)    Calcium 8.1 (*)    Total Protein 5.3 (*)    Albumin 2.9 (*)    Total  Bilirubin 1.9 (*)    All other components within normal limits  CBC WITH DIFFERENTIAL/PLATELET - Abnormal; Notable for the following components:   WBC 16.3 (*)    RBC 2.19 (*)    Hemoglobin 6.7 (*)    HCT 21.0 (*)    Platelets 539 (*)    nRBC 1.1 (*)    Neutro Abs 12.7 (*)    Monocytes Absolute 1.1 (*)    Abs Immature Granulocytes 0.79 (*)    All other components within normal limits  CBG MONITORING, ED - Abnormal; Notable for the following components:   Glucose-Capillary 233 (*)    All other components within normal limits  CBG MONITORING, ED - Abnormal; Notable for the following components:   Glucose-Capillary 213 (*)    All other components within normal  limits  MAGNESIUM  TYPE AND SCREEN  PREPARE RBC (CROSSMATCH)  TROPONIN I (HIGH SENSITIVITY)  TROPONIN I (HIGH SENSITIVITY)    EKG EKG Interpretation  Date/Time:  Saturday Nov 05 2021 16:17:07 EDT Ventricular Rate:  101 PR Interval:  118 QRS Duration: 84 QT Interval:  345 QTC Calculation: 448 R Axis:   35 Text Interpretation: Sinus tachycardia Abnormal R-wave progression, early transition since last tracing no significant change Confirmed by Mancel Bale [phone]) on 11/05/2021 6:45:24 PM  Radiology DG Hip Unilat W or Wo Pelvis 2-3 Views Right  Result Date: 11/05/2021 CLINICAL DATA:  Pain in right hip following arthroplasty. EXAM: DG HIP (WITH OR WITHOUT PELVIS) 2-3V RIGHT COMPARISON:  None Available. FINDINGS: Postoperative changes from a right total hip arthroplasty identified. There are no signs of periprosthetic fracture or dislocation. Small foci of gas noted within the adjacent soft tissues, likely postoperative. The left hip appears located and intact. Moderate left hip osteoarthritis. IMPRESSION: Status post right total hip arthroplasty. Electronically Signed   By: Signa Kell M.D.   On: 11/05/2021 18:27    Procedures Procedures    Medications Ordered in ED Medications  0.9 %  sodium chloride infusion ( Intravenous New Bag/Given 11/05/21 1657)  potassium chloride SA (KLOR-CON M) CR tablet 40 mEq (has no administration in time range)  fentaNYL (SUBLIMAZE) injection 50 mcg (has no administration in time range)  ondansetron (ZOFRAN) injection 4 mg (4 mg Intravenous Given 11/05/21 1657)    ED Course/ Medical Decision Making/ A&P                           Medical Decision Making Patient presenting with right leg pain after recent complicated total hip replacement.  She required postop blood transfusion and vasopressor support, and was diagnosed with PE started on oral anticoagulant.  She is presenting with 10 out of 10 pain which occurred while she was walking.   Differential diagnosis includes failure of surgical appliance, rebleeding, complication from oral anticoagulation, wound infection.  Patient apparently had graduated to ambulating with a walker but now has severe pain with walking.  Problems Addressed: Anemia, unspecified type: acute illness or injury    Details: Likely secondary to postoperative bleeding. Right leg pain: acute illness or injury    Details: Worsening today. Visit for wound check: acute illness or injury    Details: No outward appearance of infection.  Amount and/or Complexity of Data Reviewed Independent Historian:     Details: She is a cogent historian.  Husband at bedside gives additional history. External Data Reviewed: notes.    Details: Recent total replacement hip surgery on 10/18/2021; postoperative course complicated by  blood loss requiring multiple transfusions and vasopressor support for several days. Labs: ordered.    Details: CBC, metabolic panel, blood type-normal except glucose high, potassium low,Calcium low, albumin, bilirubin high Radiology: ordered and independent interpretation performed.    Details: Pelvis and right hip films-no dislocation or fracture.  CT, likely hematoma without clear evidence for active bleeding or acute infection. Discussion of management or test interpretation with external provider(s): Case discussed with orthopedics, Dr. Odis Hollingshead.  He and I reviewed the radiographs together.  He does not see a need for orthopedics involvement at this time.  He would recommend routine follow-up with orthopedics as planned.  Case discussed with hospitalist to arrange for admission.  He requested CT hip to evaluate for active bleeding.  Risk Prescription drug management. Decision regarding hospitalization. Risk Details: Patient with malaise, chest pain and shortness of breath when walking and developing worsening right leg pain.  She is about 1 week postoperative right hip replacement complicated by  postoperative anemia, hypotension and diagnosis of PE.  She has since been placed on Xarelto.  In the ED patient had some improvement with treatment initiated including IV fluids, and narcotic analgesia.  X-ray did not show fracture or complications from recent surgery.  Incidental hypokalemia required treatment.  Orthopedics consultation undertaken, they do not feel there are any interventions required from their service.  Blood transfusions ordered by me.  Hospitalist consulted to arrange for admission, and monitoring.  Patient is hemodynamically stable, so will not plan on reversing anticoagulations at this point.  Oxygenation normal.  Critical Care Total time providing critical care: 50 minutes          Final Clinical Impression(s) / ED Diagnoses Final diagnoses:  Anemia, unspecified type  Right leg pain  Visit for wound check    Rx / DC Orders ED Discharge Orders     None         Mancel Bale, MD 11/05/21 2143

## 2021-11-05 NOTE — Assessment & Plan Note (Signed)
Mild S.Tach at this time.  Most likely due to PE.  Had mild S.Tach at time of DC yesterday as well.

## 2021-11-05 NOTE — Assessment & Plan Note (Addendum)
With complications of ABLA and hemorrhagic shock post-op, followed by PE, now followed by apparent re-bleed as evidenced by HGB drop in past 24h.  Having increased pain at site of surgery. 1. Fentanyl PRN pain 2. Getting CT scan to evaluate. 1. Shows 10x6x16 cm fluid collection, presumably hematoma though infection not ruled out. 2. No fevers, WBC 16k, possibly just stress response, only slightly up from 13 yesterday 3. Holding off on ABx for the moment. 1. Watch for fevers, worsening WBC, erythema, etc, to indicate need. 3. NPO for the moment 4. Ortho surgery to determine wether evacuation is needed or not.

## 2021-11-05 NOTE — ED Notes (Signed)
Lab called to add magnesium onto specimen in lab.

## 2021-11-05 NOTE — Assessment & Plan Note (Addendum)
Recent post-op PE on CT scan this past week, pt had been started on eliquis. 1. Holding Eliquis in setting of acute bleed / re-bleed with HGB drop of 2g in past 24h 2. Considered IVC filter; however, per last hospitalist PN: no residual clot seen on BLE [place] for DVT. 1. May want to repeat US in AM

## 2021-11-05 NOTE — Assessment & Plan Note (Addendum)
Concerned about re-bleed with 2g HGB drop in past 24h. Think this is a 'real' drop rather than dilutional, as her other cell lines (WBC, and platelets) have actually increased in that time span. 1. CT hip to look for hematoma / active bleed evidence 2. Hold eliquis (last dose was in AM) 1. Check INR 2. Hemodynamically stable at the moment and last dose in AM, so holding off on K. Centra (especially with a PE present in lung still). 3. 2u PRBC transfusion ordered 1. Repeat CBC in AM 2. H/h Q6H thereafter 4. Tele monitor 5. NPO for the moment - in case surgical intervention needed 1. EDP also consulting ortho

## 2021-11-05 NOTE — ED Triage Notes (Signed)
Patient presented to ed with c/o post-Op pain. Patient had hip replacement to the right hip on on the 10/28/21. Patient report that she took hydrocodone and tylenol 1000 mg before ems got her.

## 2021-11-05 NOTE — H&P (Addendum)
History and Physical    PatientMarland Kitchen: Monica BreamMargie Martin Martin NWG:[id number] DOB: 06/28/1950 DOA: 11/05/2021 DOS: the patient was seen and examined on 11/05/2021 PCP: Monica Cornusso, John, MD  Patient coming from: Home  Chief Complaint: Post op Hip pain  HPI: Monica JanMargie Martin Martin is a 71 y.o. female with medical history significant of obesity, HLD, arthritis.  Pt underwent THA a few days ago on 5/19.  Post-op course was complicated by 1L EBL during surgery, development of ABLA and hemorrhagic shock post op requiring pressors and transfusions.  Then development of PE, pt started on eliquis.  Eventually pt stabilized with HGB of 8.x for past couple of days, discharged with HGB 8.5 yesterday.  Today she was walking back to her bedroom after being up for several hours, when she experienced sudden pain in her entire right leg, and felt faint and had both chest pain and shortness of breath.  States she felt a POP in her leg at that time.  She was able to get back to her bed, where she laid down and EMS was then summoned to transfer her here for evaluation.  She had been apparently using low-dose hydrocodone, one half of a 5 mg tablet, with Tylenol, sporadically.  She has been supplementing this with extra Tylenol when she takes it.  Leg and thigh is significantly more swollen per pt and husband than it was this AM.  In ED: Pt has 2g HGB drop compared to yesterday. Mild S.Tach though this is essentially unchanged from discharge. Review of Systems: As mentioned in the history of present illness. All other systems reviewed and are negative. Past Medical History:  Diagnosis Date   Hyperlipidemia    Osteoarthritis    Sciatica    Past Surgical History:  Procedure Laterality Date   HIP ARTHROPLASTY Right    JOINT REPLACEMENT Right    Social History:  reports that she has never smoked. She has never used smokeless tobacco. She reports that she does not drink alcohol and does not use drugs.  No Known Allergies  History  reviewed. No pertinent family history.  Prior to Admission medications   Medication Sig Start Date End Date Taking? Authorizing Provider  acetaminophen (TYLENOL) 500 MG tablet Take 2 tablets (1,000 mg total) by mouth every 8 (eight) hours as needed for moderate pain, mild pain or fever. 11/04/21   Hill, Alain HoneyAvery S, PA-C  apixaban (ELIQUIS) 5 MG TABS tablet Take 1 tablet (5 mg total) by mouth 2 (two) times daily. 11/04/21 02/02/22  Clois DupesHill, Avery S, PA-C  Carboxymethylcellulose Sodium (ARTIFICIAL TEARS OP) Place 1 drop into both eyes 3 (three) times daily.    [provider]  co-enzyme Q-10 30 MG capsule Take 30 mg by mouth daily.    [provider]  cyclobenzaprine (FLEXERIL) 10 MG tablet Take 10 mg by mouth daily as needed for muscle spasms. 04/29/13   [provider]  fish oil-omega-3 fatty acids 1000 MG capsule Take 1 g by mouth daily.     [provider]  fluticasone (FLONASE) 50 MCG/ACT nasal spray Place 1 spray into both nostrils daily as needed for allergies or rhinitis.    [provider]  glucosamine-chondroitin 500-400 MG tablet Take 1 tablet by mouth daily.    [provider]  Multiple Vitamins-Minerals (MULTIVITAMIN WITH MINERALS) tablet Take 1 tablet by mouth daily. centrum    [provider]  multivitamin-lutein (OCUVITE-LUTEIN) CAPS capsule Take 1 capsule by mouth daily.    [provider]  OVER  THE COUNTER MEDICATION Take 1 tablet by mouth daily. Astragalus propinquus    [provider]    Physical Exam: Vitals:   11/05/21 1845 11/05/21 1900 11/05/21 1920 11/05/21 2000  BP: 117/63 (!) 126/57 131/65 (!) 109/56  Pulse: (!) 105 (!) 101 (!) 102 (!) 107  Resp: 20 13 18 12   Temp:      TempSrc:      SpO2: 100% 100% 100% 96%   Constitutional: NAD, calm, comfortable Eyes: PERRL, lids and conjunctivae normal ENMT: Mucous membranes are moist. Posterior pharynx clear of any exudate or lesions.Normal dentition.   Neck: normal, supple, no masses, no thyromegaly Respiratory: clear to auscultation bilaterally, no wheezing, no crackles. Normal respiratory effort. No accessory muscle use.  Cardiovascular: Regular rate and rhythm, no murmurs / rubs / gallops. No extremity edema. 2+ pedal pulses. No carotid bruits.  Abdomen: no tenderness, no masses palpated. No hepatosplenomegaly. Bowel sounds positive.  Musculoskeletal: R hip TTP, R hip with significant swelling, bruising, especially to medial aspect of thigh. Skin: Surgical wound without erythema, does have serous drainage. Neurologic: CN 2-12 grossly intact. Sensation intact, DTR normal. Strength 5/5 in all 4.  Psychiatric: Normal judgment and insight. Alert and oriented x 3. Normal mood.   Data Reviewed:       Latest Ref Rng & Units 11/05/2021    4:33 PM 11/04/2021    6:22 AM 11/03/2021    2:43 AM  CBC  WBC 4.0 - 10.5 K/uL 16.3   13.3   11.7    Hemoglobin 12.0 - 15.0 g/dL 6.7   8.5   8.9    Hematocrit 36.0 - 46.0 % 21.0   26.5   27.3    Platelets 150 - 400 K/uL 539   459   405     CMP     Component Value Date/Time   NA 136 11/05/2021 1633   K 3.3 (L) 11/05/2021 1633   CL 102 11/05/2021 1633   CO2 24 11/05/2021 1633   GLUCOSE 239 (H) 11/05/2021 1633   BUN 38 (H) 11/05/2021 1633   CREATININE 0.88 11/05/2021 1633   CALCIUM 8.1 (L) 11/05/2021 1633   PROT 5.3 (L) 11/05/2021 1633   ALBUMIN 2.9 (L) 11/05/2021 1633   AST 18 11/05/2021 1633   ALT 25 11/05/2021 1633   ALKPHOS 59 11/05/2021 1633   BILITOT 1.9 (H) 11/05/2021 1633   GFRNONAA >60 11/05/2021 1633   GFRAA >90 12/04/2011 1357   CT hip: shows whats most likely the suspected hematoma that we were looking for: Status post total right hip arthroplasty, presumably recent. There is a heterogeneous collection within the deep aspect of the subcutaneous fat of the anterolateral right thigh and mildly extending into the adjacent anterior quadriceps musculature suggesting a hematoma with  scattered air. This may represent a postoperative hematoma from anterolateral approach. Recommend clinical correlation of the timing of the surgery, as the scattered air may be from very recent surgery, however if the surgery was a few days ago/more remote, this air could represent a superimposed infectious process.  Assessment and Plan: * ABLA (acute blood loss anemia) Concerned about re-bleed with 2g HGB drop in past 24h. Think this is a 'real' drop rather than dilutional, as her other cell lines (WBC, and platelets) have actually increased in that time span. CT hip to look for hematoma / active bleed evidence Hold eliquis (last dose was in AM) Check INR Hemodynamically stable at the moment and last dose in AM, so holding off  on K. Centra (especially with a PE present in lung still). 2u PRBC transfusion ordered Repeat CBC in AM H/h Q6H thereafter Tele monitor NPO for the moment - in case surgical intervention needed EDP also consulting ortho  Acute pulmonary embolus (HCC) Recent post-op PE on CT scan this past week, pt had been started on eliquis. Holding Eliquis in setting of acute bleed / re-bleed with HGB drop of 2g in past 24h Considered IVC filter; however, per last hospitalist PN: no residual clot seen on BLE [place] for DVT. May want to repeat US in AM  Sinus tachycardia Mild S.Tach at this time.  Most likely due to PE.  Had mild S.Tach at time of DC yesterday as well.  S/P total hip arthroplasty With complications of ABLA and hemorrhagic shock post-op, followed by PE, now followed by apparent re-bleed as evidenced by HGB drop in past 24h.  Having increased pain at site of surgery. Fentanyl PRN pain Getting CT scan to evaluate. Shows 10x6x16 cm fluid collection, presumably hematoma though infection not ruled out. No fevers, WBC 16k, possibly just stress response, only slightly up from 13 yesterday Holding off on ABx for the moment. Watch for fevers, worsening WBC, erythema,  etc, to indicate need. NPO for the moment Ortho surgery to determine wether evacuation is needed or not.  Hyperglycemia Without DM diagnosis (stress response?) A1C 6.0 recently Sensitive SSI Q4H for the moment.      Advance Care Planning:   Code Status: Full Code  Consults: Ortho surgery called by EDP, im going to call Dr. Odis Hollingshead back and update him with the fluid collection (presumably hematoma) findings.  Im going to urge that they see pt in AM (even if only to assess and discuss with husband).  Family Communication: Husband at bedside  Severity of Illness: The appropriate patient status for this patient is OBSERVATION. Observation status is judged to be reasonable and necessary in order to provide the required intensity of service to ensure the patient's safety. The patient's presenting symptoms, physical exam findings, and initial radiographic and laboratory data in the context of their medical condition is felt to place them at decreased risk for further clinical deterioration. Furthermore, it is anticipated that the patient will be medically stable for discharge from the hospital within 2 midnights of admission.   Author: Hillary Bow., DO 11/05/2021 9:27 PM  For on call review [email or web address].

## 2021-11-06 ENCOUNTER — Observation Stay (HOSPITAL_COMMUNITY): Payer: Medicare HMO

## 2021-11-06 ENCOUNTER — Observation Stay (HOSPITAL_BASED_OUTPATIENT_CLINIC_OR_DEPARTMENT_OTHER): Payer: Medicare HMO

## 2021-11-06 DIAGNOSIS — Z6833 Body mass index (BMI) 33.0-33.9, adult: Secondary | ICD-10-CM | POA: Diagnosis not present

## 2021-11-06 DIAGNOSIS — M7989 Other specified soft tissue disorders: Secondary | ICD-10-CM | POA: Diagnosis not present

## 2021-11-06 DIAGNOSIS — S8991XA Unspecified injury of right lower leg, initial encounter: Secondary | ICD-10-CM | POA: Diagnosis not present

## 2021-11-06 DIAGNOSIS — Y838 Other surgical procedures as the cause of abnormal reaction of the patient, or of later complication, without mention of misadventure at the time of the procedure: Secondary | ICD-10-CM | POA: Diagnosis present

## 2021-11-06 DIAGNOSIS — I2699 Other pulmonary embolism without acute cor pulmonale: Secondary | ICD-10-CM | POA: Diagnosis not present

## 2021-11-06 DIAGNOSIS — M79604 Pain in right leg: Secondary | ICD-10-CM | POA: Diagnosis not present

## 2021-11-06 DIAGNOSIS — Z7901 Long term (current) use of anticoagulants: Secondary | ICD-10-CM | POA: Diagnosis not present

## 2021-11-06 DIAGNOSIS — Z79899 Other long term (current) drug therapy: Secondary | ICD-10-CM | POA: Diagnosis not present

## 2021-11-06 DIAGNOSIS — R710 Precipitous drop in hematocrit: Secondary | ICD-10-CM | POA: Diagnosis not present

## 2021-11-06 DIAGNOSIS — R079 Chest pain, unspecified: Secondary | ICD-10-CM | POA: Diagnosis not present

## 2021-11-06 DIAGNOSIS — R739 Hyperglycemia, unspecified: Secondary | ICD-10-CM | POA: Diagnosis present

## 2021-11-06 DIAGNOSIS — D62 Acute posthemorrhagic anemia: Secondary | ICD-10-CM | POA: Diagnosis present

## 2021-11-06 DIAGNOSIS — Z96641 Presence of right artificial hip joint: Secondary | ICD-10-CM | POA: Diagnosis present

## 2021-11-06 DIAGNOSIS — M96840 Postprocedural hematoma of a musculoskeletal structure following a musculoskeletal system procedure: Secondary | ICD-10-CM | POA: Diagnosis present

## 2021-11-06 DIAGNOSIS — R000 Tachycardia, unspecified: Secondary | ICD-10-CM | POA: Diagnosis not present

## 2021-11-06 DIAGNOSIS — Z9889 Other specified postprocedural states: Secondary | ICD-10-CM | POA: Diagnosis not present

## 2021-11-06 DIAGNOSIS — M1990 Unspecified osteoarthritis, unspecified site: Secondary | ICD-10-CM | POA: Diagnosis present

## 2021-11-06 DIAGNOSIS — E785 Hyperlipidemia, unspecified: Secondary | ICD-10-CM | POA: Diagnosis present

## 2021-11-06 DIAGNOSIS — R7303 Prediabetes: Secondary | ICD-10-CM | POA: Diagnosis not present

## 2021-11-06 DIAGNOSIS — Z86711 Personal history of pulmonary embolism: Secondary | ICD-10-CM | POA: Diagnosis not present

## 2021-11-06 DIAGNOSIS — E669 Obesity, unspecified: Secondary | ICD-10-CM | POA: Diagnosis present

## 2021-11-06 DIAGNOSIS — Y793 Surgical instruments, materials and orthopedic devices (including sutures) associated with adverse incidents: Secondary | ICD-10-CM | POA: Diagnosis present

## 2021-11-06 LAB — CBC
HCT: 26.2 % — ABNORMAL LOW (ref 36.0–46.0)
HCT: 26.6 % — ABNORMAL LOW (ref 36.0–46.0)
Hemoglobin: 8.7 g/dL — ABNORMAL LOW (ref 12.0–15.0)
Hemoglobin: 8.8 g/dL — ABNORMAL LOW (ref 12.0–15.0)
MCH: 30.9 pg (ref 26.0–34.0)
MCH: 30.8 pg (ref 26.0–34.0)
MCHC: 33.2 g/dL (ref 30.0–36.0)
MCHC: 33.1 g/dL (ref 30.0–36.0)
MCV: 92.9 fL (ref 80.0–100.0)
MCV: 93 fL (ref 80.0–100.0)
Platelets: 430 10*3/uL — ABNORMAL HIGH (ref 150–400)
Platelets: 540 10*3/uL — ABNORMAL HIGH (ref 150–400)
RBC: 2.82 MIL/uL — ABNORMAL LOW (ref 3.87–5.11)
RBC: 2.86 MIL/uL — ABNORMAL LOW (ref 3.87–5.11)
RDW: 15.2 % (ref 11.5–15.5)
RDW: 15.4 % (ref 11.5–15.5)
WBC: 13.3 10*3/uL — ABNORMAL HIGH (ref 4.0–10.5)
WBC: 13.2 10*3/uL — ABNORMAL HIGH (ref 4.0–10.5)
nRBC: 1 % — ABNORMAL HIGH (ref 0.0–0.2)
nRBC: 0.8 % — ABNORMAL HIGH (ref 0.0–0.2)

## 2021-11-06 LAB — GLUCOSE, CAPILLARY
Glucose-Capillary: 160 mg/dL — ABNORMAL HIGH (ref 70–99)
Glucose-Capillary: 140 mg/dL — ABNORMAL HIGH (ref 70–99)
Glucose-Capillary: 152 mg/dL — ABNORMAL HIGH (ref 70–99)
Glucose-Capillary: 116 mg/dL — ABNORMAL HIGH (ref 70–99)
Glucose-Capillary: 161 mg/dL — ABNORMAL HIGH (ref 70–99)

## 2021-11-06 LAB — BASIC METABOLIC PANEL
Anion gap: 6 (ref 5–15)
BUN: 30 mg/dL — ABNORMAL HIGH (ref 8–23)
CO2: 26 mmol/L (ref 22–32)
Calcium: 8 mg/dL — ABNORMAL LOW (ref 8.9–10.3)
Chloride: 107 mmol/L (ref 98–111)
Creatinine, Ser: 0.72 mg/dL (ref 0.44–1.00)
GFR, Estimated: >60 mL/min (ref >60)
Glucose, Bld: 161 mg/dL — ABNORMAL HIGH (ref 70–99)
Potassium: 4 mmol/L (ref 3.5–5.1)
Sodium: 139 mmol/L (ref 135–145)

## 2021-11-06 LAB — HIV ANTIBODY (ROUTINE TESTING W REFLEX): HIV Screen 4th Generation wRfx: NONREACTIVE

## 2021-11-06 LAB — HEMOGLOBIN AND HEMATOCRIT, BLOOD
HCT: 27.8 % — ABNORMAL LOW (ref 36.0–46.0)
Hemoglobin: 9.3 g/dL — ABNORMAL LOW (ref 12.0–15.0)

## 2021-11-06 MED ORDER — FENTANYL CITRATE (PF) 100 MCG/2ML IJ SOLN
50.0000 ug | INTRAMUSCULAR | Status: AC | PRN
  Administered 2021-11-06 – 2021-11-07 (×2): 50 ug via INTRAVENOUS
  Filled 2021-11-06 (×2): qty 2

## 2021-11-06 MED ORDER — SODIUM CHLORIDE 0.9 % IV SOLN: INTRAVENOUS | Status: DC

## 2021-11-06 MED ORDER — SODIUM CHLORIDE (PF) 0.9 % IJ SOLN
INTRAMUSCULAR | Status: AC
  Filled 2021-11-06: qty 50

## 2021-11-06 MED ORDER — IOHEXOL 350 MG/ML SOLN
80.0000 mL | Freq: Once | INTRAVENOUS | Status: AC | PRN
  Administered 2021-11-06: 80 mL via INTRAVENOUS

## 2021-11-06 MED ORDER — CALCIUM CARBONATE ANTACID 500 MG PO CHEW
1.0000 | CHEWABLE_TABLET | Freq: Three times a day (TID) | ORAL | Status: DC | PRN
  Administered 2021-11-06: 200 mg via ORAL
  Filled 2021-11-06: qty 1

## 2021-11-06 MED ORDER — PROCHLORPERAZINE EDISYLATE 10 MG/2ML IJ SOLN
10.0000 mg | Freq: Four times a day (QID) | INTRAMUSCULAR | Status: DC | PRN
  Administered 2021-11-06: 10 mg via INTRAVENOUS
  Filled 2021-11-06: qty 2

## 2021-11-06 MED ORDER — PANTOPRAZOLE SODIUM 40 MG PO TBEC
40.0000 mg | DELAYED_RELEASE_TABLET | Freq: Every day | ORAL | Status: DC
Start: 2021-11-06 — End: 2021-11-09
  Administered 2021-11-06 – 2021-11-09 (×4): 40 mg via ORAL
  Filled 2021-11-06 (×4): qty 1

## 2021-11-06 MED ORDER — BISACODYL 10 MG RE SUPP: 10.0000 mg | Freq: Once | RECTAL | Status: DC

## 2021-11-06 NOTE — Progress Notes (Signed)
On assessment patient right thigh appears bigger than left. Provider Julian Reil, DO made aware. Will continue to monitor.

## 2021-11-06 NOTE — Progress Notes (Signed)
Lower extremity venous bilateral study completed.  Preliminary results relayed to RN.   See CV Proc for preliminary results report.   Glenis Musolf, RDMS, RVT  

## 2021-11-06 NOTE — Progress Notes (Signed)
Patient had night gown and a transfer belt as belongings on admission. Will continue to monitor.

## 2021-11-06 NOTE — Progress Notes (Addendum)
PROGRESS NOTE    Monica Martin  GEX:[id number] DOB: 11/21/1950 DOA: 11/05/2021 PCP: Creola Cornusso, John, MD    Brief Narrative:  71 year old female with a history of prediabetes, recently admitted to the hospital for elective right total hip arthroplasty.  Postoperative course was complicated by development of hemorrhagic shock requiring vasopressors and PRBC transfusion.  Was also noted that she had possible acute pulmonary embolus during her hospital stay.  She was discharged on 5/26 on Eliquis.  Her hemoglobin has been stable at that time she was working with physical therapy.  Unfortunately, she was readmitted the following day on 5/27 when she had worsening pain in her right lower extremity and associated chest discomfort and shortness of breath.  Her hemoglobin was checked and noted to be down to 6.7 on readmission.  She was transfused PRBC.  Imaging of her right hip did not show any bony abnormalities.  She was noted to have persistent hematoma.   Assessment & Plan:   Principal Problem:   ABLA (acute blood loss anemia) Active Problems:   Post-operative hemoglobin drop   Acute pulmonary embolus (HCC)   Sinus tachycardia   S/P total hip arthroplasty   Prediabetes   Hyperglycemia   Obesity (BMI 30-39.9)  Addendum: CTA chest reviewed with radiology and although not reported in current report, small subsegmental PE noted in RML on previous CT is still noted. LLL findings equivocal and possibly artifact. See discussion around management below  Acute blood loss anemia -Likely related to postoperative blood loss from right hip hematoma/bleeding -Hemoglobin noted to be 8.5 on 5/26.  This dropped down to 6.7 on 5/27 when she was readmitted -She received 2 units PRBCs with improvement in hemoglobin to 8.8 -Continue to follow serial hemoglobin  Status post right total hip arthroplasty Right thigh hematoma -Orthopedics following -Do not feel that evacuation is needed at this time -Continue  supportive measures -Keep lower extremity elevated -Apply ice to left hip  Acute pulmonary embolus -Noted on CT scan from 5/23 -Venous Dopplers done previously noted to be negative -Doppler studies repeated again today and remain negative -I reviewed prior CT from 5/23 with radiology and it was noted that she did have a small subsegmental pulmonary embolus in the right middle lobe -CTA chest repeated today and RML findings have persisted -I also reviewed CTA chest with PCCM and it was noted that subsegmental PE was very small and question arose if it was even clinically significant -currently patient is not hypoxic or short of breath, no chest pain -her hemodynamics have improved -in current setting with recurrent bleeding, with findings of very small PE that does not appear to be clinically significant, we will elect to not treat current CT findings and follow patient clinically -Hopefully as patient improves and is more mobile, her risk of further VTE will also decrease -discussed risk/benefit of holding anticoagulation with patient and husband and they agree with current plan -will start on asa bid for VTE prophylaxis  Nausea and vomiting -last BM was 3 days ago, ? If she is developing ileus -will give suppository -continue antiemetics -will start on IV fluids since po intake has been poor  Prediabetes -Recent A1c 6.0 -Continue to follow serial blood sugars  Obesity -BMI 33.5 -Noted  Adrenal nodule (HCC) Incidental finding of bilateral adrenal nodule on CT chest -will need repeat CT in 1 year for surveillance   DVT prophylaxis: SCDs Start: 11/05/21 2012  Code Status: full code Family Communication: discussed with husband at the bedside  Disposition Plan: Status is: Observation The patient will require care spanning > 2 midnights and should be moved to inpatient because: continued monitoring of hemoglobin and anticoagulation     Consultants:   orthopedics  Procedures:    Antimicrobials:      Subjective: Complains of pain in her right thigh. She has some heart burn. Shortness of breath is better now.  Objective: Vitals:   11/06/21 0800 11/06/21 0810 11/06/21 0900 11/06/21 1000  BP: (!) 150/57  (!) 140/56 (!) 155/64  Pulse: 100  98 95  Resp: Temp:  98.2 F (36.8 C)    TempSrc:  Oral    SpO2: 100%  99% 98%  Weight:        Intake/Output Summary (Last 24 hours) at 11/06/2021 1122 Last data filed at 11/06/2021 0813 Gross per 24 hour  Intake 1145.05 ml  Output 430 ml  Net 715.05 ml   Filed Weights   11/05/21 2145  Weight: 94.4 kg    Examination:  General exam: Appears calm and comfortable  Respiratory system: Clear to auscultation. Respiratory effort normal. Cardiovascular system: S1 & S2 heard, RRR. No JVD, murmurs, rubs, gallops or clicks.  Gastrointestinal system: Abdomen is nondistended, soft and nontender. No organomegaly or masses felt. Normal bowel sounds heard. Central nervous system: Alert and oriented. No focal neurological deficits. Extremities: significant swelling of RLE, bruising around right thigh, She has difficulty raising right leg off bed due to pain and weakness, feels as though its heavy Skin: No rashes, lesions or ulcers Psychiatry: Judgement and insight appear normal. Mood & affect appropriate.     Data Reviewed: I have personally reviewed following labs and imaging studies  CBC: Recent Labs  Lab 11/02/21 0229 11/03/21 0243 11/04/21 0622 11/05/21 1633 11/06/21 0322 11/06/21 0624 11/06/21 1053  WBC 10.4 11.7* 13.3* 16.3* 13.3* 13.2*  --   NEUTROABS 7.3  --   --  12.7*  --   --   --   HGB 8.7* 8.9* 8.5* 6.7* 8.7* 8.8* 9.3*  HCT 26.3* 27.3* 26.5* 21.0* 26.2* 26.6* 27.8*  MCV 93.3 93.8 94.6 95.9 92.9 93.0  --   PLT 306 405* 459* 539* 430* 540*  --    Basic Metabolic Panel: Recent Labs  Lab 10/31/21 0758 11/01/21 0610 11/02/21 0229 11/04/21 0622 11/05/21 1633  11/05/21 1839 11/06/21 0322  NA  --  140 139 140 136  --  139  K  --  3.4* 3.8 3.5 3.3*  --  4.0  CL  --  106 107 105 102  --  107  CO2  --  --  26  GLUCOSE  --  153* 137* 157* 239*  --  161*  BUN  --  12 15 26* 38*  --  30*  CREATININE  --  0.69 0.68 0.68 0.88  --  0.72  CALCIUM  --  7.8* 7.7* 8.3* 8.1*  --  8.0*  MG 2.0 2.1 2.0  --   --  2.4  --   PHOS  --   --  4.2  --   --   --   --    GFR: Estimated Creatinine Clearance: 76.8 mL/min (by C-G formula based on SCr of 0.72 mg/dL). Liver Function Tests: Recent Labs  Lab 10/31/21 0640 11/02/21 0229 11/04/21 0622 11/05/21 1633  AST 37 18 11* 18  ALT ALKPHOS 46 51 62 59  BILITOT 0.9 1.2 1.7*  1.9*  PROT 4.4* 4.6* 5.6* 5.3*  ALBUMIN 2.4* 2.3* 3.0* 2.9*   No results for input(s): LIPASE, AMYLASE in the last 168 hours. No results for input(s): AMMONIA in the last 168 hours. Coagulation Profile: Recent Labs  Lab 11/05/21 2218  INR 1.3*   Cardiac Enzymes: No results for input(s): CKTOTAL, CKMB, CKMBINDEX, TROPONINI in the last 168 hours. BNP (last 3 results) No results for input(s): PROBNP in the last 8760 hours. HbA1C: No results for input(s): HGBA1C in the last 72 hours. CBG: Recent Labs  Lab 11/05/21 1826 11/05/21 2036 11/05/21 2225 11/06/21 0323 11/06/21 0750  GLUCAP 213* 171* 157* 160* 140*   Lipid Profile: No results for input(s): CHOL, HDL, LDLCALC, TRIG, CHOLHDL, LDLDIRECT in the last 72 hours. Thyroid Function Tests: No results for input(s): TSH, T4TOTAL, FREET4, T3FREE, THYROIDAB in the last 72 hours. Anemia Panel: No results for input(s): VITAMINB12, FOLATE, FERRITIN, TIBC, IRON, RETICCTPCT in the last 72 hours. Sepsis Labs: No results for input(s): PROCALCITON, LATICACIDVEN in the last 168 hours.  Recent Results (from the past 240 hour(s))  MRSA Next Gen by PCR, Nasal     Status: None   Collection Time: 10/28/21  6:25 PM   Specimen: Nasal Mucosa; Nasal Swab  Result Value  Ref Range Status   MRSA by PCR Next Gen NOT DETECTED NOT DETECTED Final    Comment: (NOTE) The GeneXpert MRSA Assay (FDA approved for NASAL specimens only), is one component of a comprehensive MRSA colonization surveillance program. It is not intended to diagnose MRSA infection nor to guide or monitor treatment for MRSA infections. Test performance is not FDA approved in patients less than 64 years old. Performed at [facility] [address], [place], [place] [address]          Radiology Studies: CT Hip Right W and/or Wo Contrast  Result Date: 11/05/2021 CLINICAL DATA:  Status post total right hip arthroplasty. Bleeding and pain. EXAM: CT OF THE LOWER RIGHT EXTREMITY WITHOUT CONTRAST TECHNIQUE: Multidetector CT imaging of the lower right extremity was performed following the standard protocol before and during bolus administration of intravenous contrast. RADIATION DOSE REDUCTION: This exam was performed according to the departmental dose-optimization program which includes automated exposure control, adjustment of the mA and/or kV according to patient size and/or use of iterative reconstruction technique. CONTRAST:  OMNIPAQUE IOHEXOL 300 MG/ML  SOLN COMPARISON:  Pelvis and right hip radiographs 11/05/2021 FINDINGS: Bones/Joint/Cartilage Status post total right hip arthroplasty with single intertrochanteric cerclage wire. Within the limitation of mild metallic streak artifact, no perihardware lucency is seen to indicate hardware loosening. No acute fracture. Mild joint space narrowing and peripheral osteophytosis within the pubic symphysis. There is severe L5-S1 disc space narrowing with anterior bridging osteophytosis and partial osseous fusion. Mild degenerative vacuum phenomenon within the L4-5 disc space. Ligaments Suboptimally assessed by CT. Muscles and Tendons There is a collection of heterogeneous fluid density and scattered air within the deep aspect of the  subcutaneous fat of the anterolateral right hip measuring up to approximately 10 cm in greatest transverse dimension, 6 cm in greatest AP dimension, and 16 cm in greatest craniocaudal dimension. Presumably the patient has had a recent surgery, and this may represent a postoperative hematoma from anterolateral approach. There is also similar lower density edema and fluid within the proximal vastus intermedius and lateralis of the proximal anterior thigh. The rectus abdominis-adductor aponeuroses are grossly intact. Soft tissues Normal appendix. No free fluid within the pelvis. Lobular uterus, likely  from uterine fibroids. IMPRESSION: Status post total right hip arthroplasty, presumably recent. There is a heterogeneous collection within the deep aspect of the subcutaneous fat of the anterolateral right thigh and mildly extending into the adjacent anterior quadriceps musculature suggesting a hematoma with scattered air. This may represent a postoperative hematoma from anterolateral approach. Recommend clinical correlation of the timing of the surgery, as the scattered air may be from very recent surgery, however if the surgery was a few days ago/more remote, this air could represent a superimposed infectious process. Electronically Signed   By: Neita Garnet M.D.   On: 11/05/2021 21:10   DG Hip Unilat W or Wo Pelvis 2-3 Views Right  Result Date: 11/05/2021 CLINICAL DATA:  Pain in right hip following arthroplasty. EXAM: DG HIP (WITH OR WITHOUT PELVIS) 2-3V RIGHT COMPARISON:  None Available. FINDINGS: Postoperative changes from a right total hip arthroplasty identified. There are no signs of periprosthetic fracture or dislocation. Small foci of gas noted within the adjacent soft tissues, likely postoperative. The left hip appears located and intact. Moderate left hip osteoarthritis. IMPRESSION: Status post right total hip arthroplasty. Electronically Signed   By: Signa Kell M.D.   On: 11/05/2021 18:27   DG  FEMUR PORT, MIN 2 VIEWS RIGHT  Result Date: 11/05/2021 CLINICAL DATA:  Right hip surgery. EXAM: RIGHT FEMUR PORTABLE 2 VIEW COMPARISON:  Right hip x-ray 11/05/2021. CT of the right hip 11/05/2021. FINDINGS: There is a right hip arthroplasty in anatomic alignment. There is no fracture or hardware loosening. There is soft tissue swelling lateral to the thigh with a small amount of air, unchanged from prior CT. IMPRESSION: 1. Right hip arthroplasty appears uncomplicated. 2. Soft tissue swelling with air as seen on recent CT. Electronically Signed   By: Darliss Cheney M.D.   On: 11/05/2021 23:31        Scheduled Meds:  Chlorhexidine Gluconate Cloth  6 each Topical Daily   insulin aspart  0-9 Units Subcutaneous Q4H   mouth rinse  15 mL Mouth Rinse BID   multivitamin with minerals  1 tablet Oral Daily   Continuous Infusions:   LOS: 0 days    Time spent:   Erick Blinks, MD [facility]   If 7PM-7AM, please contact night-coverage [email or web address]  11/06/2021, 11:22 AM

## 2021-11-06 NOTE — Consult Note (Addendum)
Patient ID: Monica Martin MRN: [id number] DOB/AGE: [birth year] 69 y.o.  Admit date: 11/05/2021  Admission Diagnoses:  Principal Problem:   ABLA (acute blood loss anemia) Active Problems:   S/P total hip arthroplasty   Prediabetes   Sinus tachycardia   Obesity (BMI 30-39.9)   Acute pulmonary embolus (HCC)   Post-operative hemoglobin drop   Hyperglycemia   HPI: Ortho consult for right thigh pain s/p right total hip replacement on 10/28/21. The patient presented to the ER yesterday for pain control and was admitted overnight due to H&H of 6.7/21. The patient has been on Eliquis for acute PE and had multiple blood transfusions during her perioperative admission. She denies numbness/tingling or weakness. She denies fever/chills. She denies a fall. She is accompanied by her husband at the bedside. She has been admitted to the ICU for close monitoring and has received 2 units of blood overnight with excellent correction of H&H to 8.7/26.2 this morning. She has been stable since admission without hypotension or pressor requirements.   Past Medical History: Past Medical History:  Diagnosis Date   Hyperlipidemia    Osteoarthritis    Sciatica     Surgical History: Past Surgical History:  Procedure Laterality Date   HIP ARTHROPLASTY Right    JOINT REPLACEMENT Right     Family History: History reviewed. No pertinent family history.  Social History: Social History   Socioeconomic History   Marital status: Married    Spouse name: Not on file   Number of children: Not on file   Years of education: Not on file   Highest education level: Not on file  Occupational History   Not on file  Tobacco Use   Smoking status: Never   Smokeless tobacco: Never  Substance and Sexual Activity   Alcohol use: No   Drug use: No   Sexual activity: Not on file  Other Topics Concern   Not on file  Social History Narrative   Not on file   Social Determinants of Health   Financial Resource  Strain: Not on file  Food Insecurity: Not on file  Transportation Needs: Not on file  Physical Activity: Not on file  Stress: Not on file  Social Connections: Not on file  Intimate Partner Violence: Not on file    Allergies: Patient has no known allergies.  Medications: I have reviewed the patient's current medications.  Vital Signs: Patient Vitals for the past 24 hrs:  BP Temp Temp src Pulse Resp SpO2 Weight  11/06/21 0810 -- 98.2 F (36.8 C) Oral -- -- -- --  11/06/21 0800 (!) 150/57 -- -- 100 17 100 % --  11/06/21 0700 (!) 140/54 -- -- 96 15 97 % --  11/06/21 0645 -- -- -- (!) 101 12 99 % --  11/06/21 0630 -- -- -- 100 (!) 9 100 % --  11/06/21 0615 -- -- -- (!) 105 18 100 % --  11/06/21 0600 (!) 128/54 -- -- 93 15 94 % --  11/06/21 0545 -- -- -- 96 14 94 % --  11/06/21 0530 -- -- -- (!) 106 14 100 % --  11/06/21 0515 -- -- -- 100 16 91 % --  11/06/21 0500 (!) 154/58 -- -- (!) 111 18 100 % --  11/06/21 0445 -- -- -- (!) 101 15 92 % --  11/06/21 0430 -- -- -- (!) 106 17 97 % --  11/06/21 0415 -- -- -- (!) 103 16 96 % --  11/06/21 0400 Marland Kitchen)  123/59 98.6 F (37 C) Oral 98 15 98 % --  11/06/21 0345 (!) 140/56 -- -- (!) 102 17 95 % --  11/06/21 0330 (!) 145/58 -- -- (!) 108 14 98 % --  11/06/21 0315 (!) 122/55 -- -- 100 15 96 % --  11/06/21 0300 (!) 125/52 -- -- (!) 105 15 100 % --  11/06/21 0245 (!) 131/52 -- -- 100 15 100 % --  11/06/21 0230 (!) 143/50 -- -- (!) 105 17 100 % --  11/06/21 0215 (!) 142/54 -- -- (!) 104 13 100 % --  11/06/21 0200 -- -- -- 96 16 100 % --  11/06/21 0130 -- -- -- 100 16 100 % --  11/06/21 0115 -- -- -- (!) 103 18 100 % --  11/06/21 0100 (!) 117/44 -- -- (!) 107 12 100 % --  11/06/21 0056 (!) 117/44 98.3 F (36.8 C) Oral (!) 109 17 100 % --  11/06/21 0045 (!) 140/54 -- -- (!) 110 13 100 % --  11/06/21 0030 (!) 120/50 -- -- (!) 107 16 100 % --  11/06/21 0015 -- -- -- 99 16 100 % --  11/06/21 0000 (!) 120/47 -- -- 97 16 100 % --  11/05/21 2345  -- -- -- (!) 102 16 100 % --  11/05/21 2330 (!) 123/49 -- -- (!) 104 18 97 % --  11/05/21 2315 -- -- -- (!) 105 15 99 % --  11/05/21 2300 (!) 132/45 -- -- (!) 104 13 100 % --  11/05/21 2245 -- -- -- (!) 109 16 95 % --  11/05/21 2230 (!) 126/53 -- -- (!) 110 12 100 % --  11/05/21 2220 -- -- -- (!) 114 14 96 % --  11/05/21 2215 -- -- -- (!) 113 17 99 % --  11/05/21 2205 (!) 136/53 97.9 F (36.6 C) Oral (!) 112 14 100 % --  11/05/21 2200 (!) 136/53 -- -- (!) 114 14 100 % --  11/05/21 2148 -- 97.9 F (36.6 C) Oral -- -- -- --  11/05/21 2145 (!) 129/51 -- -- (!) 117 13 100 % 94.4 kg  11/05/21 2000 (!) 109/56 -- -- (!) 107 12 96 % --  11/05/21 1920 131/65 -- -- (!) 102 18 100 % --  11/05/21 1900 (!) 126/57 -- -- (!) 101 13 100 % --  11/05/21 1845 117/63 -- -- (!) 105 20 100 % --  11/05/21 1800 131/62 -- -- (!) 103 (!) 26 100 % --  11/05/21 1720 (!) 113/57 -- -- (!) 102 12 100 % --  11/05/21 1700 (!) 122/57 -- -- (!) 101 16 99 % --  11/05/21 1610 -- -- -- -- -- 98 % --  11/05/21 1607 115/65 (!) 97.4 F (36.3 C) Oral (!) 109 18 100 % --    Radiology: CT Angio Chest Pulmonary Embolism (PE) W or WO Contrast  Addendum Date: 11/02/2021   ADDENDUM REPORT: 11/02/2021 15:42 ADDENDUM: Upon re-review of CT chest 11/01/2021, the following findings are noted: A single subsegmental filling defect is suggested in a right middle lobe pulmonary artery branch which may indicate a pulmonary embolus. Electronically Signed   By: Burman Nieves M.D.   On: 11/02/2021 15:42   Result Date: 11/02/2021 CLINICAL DATA:  Pulmonary embolus suspected with unknown D-dimer. EXAM: CT ANGIOGRAPHY CHEST WITH CONTRAST TECHNIQUE: Multidetector CT imaging of the chest was performed using the standard protocol during bolus administration of intravenous contrast. Multiplanar CT image reconstructions and MIPs  were obtained to evaluate the vascular anatomy. RADIATION DOSE REDUCTION: This exam was performed according to the  departmental dose-optimization program which includes automated exposure control, adjustment of the mA and/or kV according to patient size and/or use of iterative reconstruction technique. CONTRAST:  46mL OMNIPAQUE IOHEXOL 350 MG/ML SOLN COMPARISON:  Chest radiograph 10/28/2021 FINDINGS: Cardiovascular: Examination is technically limited due to poor contrast bolus and motion artifact. However, there is moderately good opacification of the central and proximal segmental pulmonary arteries. No focal filling defects are demonstrated in these visualized vessels. No evidence of significant central pulmonary embolus. Normal heart size. No pericardial effusions. Normal caliber thoracic aorta. Calcification of the aorta. Mediastinum/Nodes: Esophagus is decompressed. Small esophageal hiatal hernia. No significant lymphadenopathy. Thyroid gland is unremarkable. Lungs/Pleura: Motion artifact limits the examination. There are areas of linear atelectasis in the right mid and both lower lungs. No focal consolidation. No pleural effusions. No pneumothorax. Upper Abdomen: Diffuse fatty infiltration of the liver. Focal circumscribed low-attenuation lesions in the lateral segment left lobe, largest measuring 3.6 cm diameter. Lesions are cystic by density measurements. The gallbladder is distended with apparent gallbladder wall thickening and adjacent stranding. No stones are identified but the appearance is suspicious for acute cholecystitis. 13 mm left adrenal gland nodule. Density measures of 21 Hounsfield units in the contrast-enhanced study. Right adrenal gland nodule measuring 1.5 cm diameter. Twenty-nine Hounsfield units on a contrast enhanced study. Infiltration of subcutaneous fat of the right flank may indicate contusion or inflammatory process. No loculated collections. Musculoskeletal: Degenerative changes in the spine. No destructive bone lesions. Review of the MIP images confirms the above findings. IMPRESSION: 1.  Technically limited examination but no evidence for significant central pulmonary embolus. 2. Bilateral adrenal gland nodules. 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 Recommend 1 year follow up adrenal washout CT. If stable for > 1 year, no further f/u imaging. JACR 2017 Aug; 14(8):1038-44, JCAT 2016 Mar-Apr; 40(2):194-200, Urol J 2006 Spring; 3(2):71-4. 3. No evidence of active pulmonary disease. 4. Diffuse fatty infiltration of the liver. 5. Thick-walled distended gallbladder with inflammatory stranding suggesting cholecystitis. No stones are identified by CT. 6. Infiltration in the subcutaneous fat over the right flank may demonstrate contusion or inflammatory process. No loculation. Electronically Signed: By: Burman Nieves M.D. On: 11/01/2021 22:13   CT Hip Right W and/or Wo Contrast  Result Date: 11/05/2021 CLINICAL DATA:  Status post total right hip arthroplasty. Bleeding and pain. EXAM: CT OF THE LOWER RIGHT EXTREMITY WITHOUT CONTRAST TECHNIQUE: Multidetector CT imaging of the lower right extremity was performed following the standard protocol before and during bolus administration of intravenous contrast. RADIATION DOSE REDUCTION: This exam was performed according to the departmental dose-optimization program which includes automated exposure control, adjustment of the mA and/or kV according to patient size and/or use of iterative reconstruction technique. CONTRAST:  OMNIPAQUE IOHEXOL 300 MG/ML  SOLN COMPARISON:  Pelvis and right hip radiographs 11/05/2021 FINDINGS: Bones/Joint/Cartilage Status post total right hip arthroplasty with single intertrochanteric cerclage wire. Within the limitation of mild metallic streak artifact, no perihardware lucency is seen to indicate hardware loosening. No acute fracture. Mild joint space narrowing and peripheral osteophytosis within the pubic symphysis. There is severe L5-S1 disc space narrowing with anterior bridging  osteophytosis and partial osseous fusion. Mild degenerative vacuum phenomenon within the L4-5 disc space. Ligaments Suboptimally assessed by CT. Muscles and Tendons There is a collection of heterogeneous fluid density and scattered air within the deep aspect of the subcutaneous  fat of the anterolateral right hip measuring up to approximately 10 cm in greatest transverse dimension, 6 cm in greatest AP dimension, and 16 cm in greatest craniocaudal dimension. Presumably the patient has had a recent surgery, and this may represent a postoperative hematoma from anterolateral approach. There is also similar lower density edema and fluid within the proximal vastus intermedius and lateralis of the proximal anterior thigh. The rectus abdominis-adductor aponeuroses are grossly intact. Soft tissues Normal appendix. No free fluid within the pelvis. Lobular uterus, likely from uterine fibroids. IMPRESSION: Status post total right hip arthroplasty, presumably recent. There is a heterogeneous collection within the deep aspect of the subcutaneous fat of the anterolateral right thigh and mildly extending into the adjacent anterior quadriceps musculature suggesting a hematoma with scattered air. This may represent a postoperative hematoma from anterolateral approach. Recommend clinical correlation of the timing of the surgery, as the scattered air may be from very recent surgery, however if the surgery was a few days ago/more remote, this air could represent a superimposed infectious process. Electronically Signed   By: Neita Garnet M.D.   On: 11/05/2021 21:10   DG CHEST PORT 1 VIEW  Result Date: 10/28/2021 CLINICAL DATA:  Central line placement EXAM: PORTABLE CHEST 1 VIEW COMPARISON:  12/04/2011, 03/22/2021 FINDINGS: Right IJ central venous catheter with distal tip terminating at the level of the distal SVC. Heart size is normal. Elevation of the right hemidiaphragm. No focal airspace consolidation, pleural effusion, or  pneumothorax. IMPRESSION: Right IJ central venous catheter with distal tip terminating at the level of the distal SVC. No pneumothorax. Electronically Signed   By: Duanne Guess D.O.   On: 10/28/2021 19:59   ECHOCARDIOGRAM COMPLETE  Result Date: 11/02/2021    ECHOCARDIOGRAM REPORT   Patient Name:   SHIREE ALTEMUS Date of Exam: 11/02/2021 Medical Rec #:  [id number]      Height:       66.0 in Accession #:    [id number]     Weight:       220.0 lb Date of Birth:  Aug 11, 1950       BSA:          2.083 m Patient Age:    71 years       BP:           147/77 mmHg Patient Gender: F              HR:           114 bpm. Exam Location:  Inpatient Procedure: 2D Echo, Cardiac Doppler and Color Doppler Indications:    Other abnormalities of the heart R00.8  History:        Patient has no prior history of Echocardiogram examinations.                 Risk Factors:Dyslipidemia.  Sonographer:    Eulah Pont RDCS Referring Phys: [phone] A CALDWELL POWELL JR IMPRESSIONS  1. Left ventricular ejection fraction, by estimation, is 60 to 65%. The left ventricle has normal function. The left ventricle has no regional wall motion abnormalities. Left ventricular diastolic parameters are consistent with Grade I diastolic dysfunction (impaired relaxation).  2. Right ventricular systolic function is normal. The right ventricular size is normal. There is mildly elevated pulmonary artery systolic pressure. The estimated right ventricular systolic pressure is 43.4 mmHg.  3. The mitral valve is normal in structure. Trivial mitral valve regurgitation. No evidence of mitral stenosis.  4. The aortic valve is tricuspid. There is  mild calcification of the aortic valve. Aortic valve regurgitation is not visualized. No aortic stenosis is present.  5. The inferior vena cava is normal in size with greater than 50% respiratory variability, suggesting right atrial pressure of 3 mmHg.  6. 3 cm liver cyst noted. FINDINGS  Left Ventricle: Left ventricular  ejection fraction, by estimation, is 60 to 65%. The left ventricle has normal function. The left ventricle has no regional wall motion abnormalities. The left ventricular internal cavity size was normal in size. There is  no left ventricular hypertrophy. Left ventricular diastolic parameters are consistent with Grade I diastolic dysfunction (impaired relaxation). Right Ventricle: The right ventricular size is normal. No increase in right ventricular wall thickness. Right ventricular systolic function is normal. There is mildly elevated pulmonary artery systolic pressure. The tricuspid regurgitant velocity is 3.18  m/s, and with an assumed right atrial pressure of 3 mmHg, the estimated right ventricular systolic pressure is 43.4 mmHg. Left Atrium: Left atrial size was normal in size. Right Atrium: Right atrial size was normal in size. Pericardium: There is no evidence of pericardial effusion. Mitral Valve: The mitral valve is normal in structure. Mild mitral annular calcification. Trivial mitral valve regurgitation. No evidence of mitral valve stenosis. Tricuspid Valve: The tricuspid valve is normal in structure. Tricuspid valve regurgitation is trivial. Aortic Valve: The aortic valve is tricuspid. There is mild calcification of the aortic valve. Aortic valve regurgitation is not visualized. No aortic stenosis is present. Pulmonic Valve: The pulmonic valve was normal in structure. Pulmonic valve regurgitation is not visualized. Aorta: The aortic root is normal in size and structure. Venous: The inferior vena cava is normal in size with greater than 50% respiratory variability, suggesting right atrial pressure of 3 mmHg. IAS/Shunts: No atrial level shunt detected by color flow Doppler.  LEFT VENTRICLE PLAX 2D LVIDd:         3.90 cm     Diastology LVIDs:         2.70 cm     LV e' medial:    8.68 cm/s LV PW:         1.00 cm     LV E/e' medial:  10.8 LV IVS:        0.90 cm     LV e' lateral:   9.65 cm/s LVOT diam:      2.00 cm     LV E/e' lateral: 9.8 LV SV:         85 LV SV Index:   41 LVOT Area:     3.14 cm  LV Volumes (MOD) LV vol d, MOD A2C: 76.6 ml LV vol d, MOD A4C: 62.6 ml LV vol s, MOD A2C: 25.5 ml LV vol s, MOD A4C: 21.4 ml LV SV MOD A2C:     51.1 ml LV SV MOD A4C:     62.6 ml LV SV MOD BP:      48.0 ml RIGHT VENTRICLE RV S prime:     16.50 cm/s TAPSE (M-mode): 1.9 cm LEFT ATRIUM             Index        RIGHT ATRIUM           Index LA diam:        3.70 cm 1.78 cm/m   RA Area:     12.70 cm LA Vol (A2C):   37.6 ml 18.05 ml/m  RA Volume:   28.20 ml  13.54 ml/m LA Vol (A4C):   45.1 ml  21.66 ml/m LA Biplane Vol: 41.7 ml 20.02 ml/m  AORTIC VALVE LVOT Vmax:   149.00 cm/s LVOT Vmean:  99.000 cm/s LVOT VTI:    0.270 m  AORTA Ao Root diam: 3.20 cm Ao Asc diam:  2.70 cm MITRAL VALVE                TRICUSPID VALVE MV Area (PHT): 5.23 cm     TR Peak grad:   40.4 mmHg MV Decel Time: 145 msec     TR Vmax:        318.00 cm/s MV E velocity: 94.10 cm/s MV A velocity: 104.00 cm/s  SHUNTS MV E/A ratio:  0.90         Systemic VTI:  0.27 m                             Systemic Diam: 2.00 cm Dalton McleanMD Electronically signed by Wilfred Lacy Signature Date/Time: 11/02/2021/5:01:25 PM    Final    DG Hip Unilat W or Wo Pelvis 2-3 Views Right  Result Date: 11/05/2021 CLINICAL DATA:  Pain in right hip following arthroplasty. EXAM: DG HIP (WITH OR WITHOUT PELVIS) 2-3V RIGHT COMPARISON:  None Available. FINDINGS: Postoperative changes from a right total hip arthroplasty identified. There are no signs of periprosthetic fracture or dislocation. Small foci of gas noted within the adjacent soft tissues, likely postoperative. The left hip appears located and intact. Moderate left hip osteoarthritis. IMPRESSION: Status post right total hip arthroplasty. Electronically Signed   By: Signa Kell M.D.   On: 11/05/2021 18:27   DG FEMUR PORT, MIN 2 VIEWS RIGHT  Result Date: 11/05/2021 CLINICAL DATA:  Right hip surgery. EXAM: RIGHT FEMUR  PORTABLE 2 VIEW COMPARISON:  Right hip x-ray 11/05/2021. CT of the right hip 11/05/2021. FINDINGS: There is a right hip arthroplasty in anatomic alignment. There is no fracture or hardware loosening. There is soft tissue swelling lateral to the thigh with a small amount of air, unchanged from prior CT. IMPRESSION: 1. Right hip arthroplasty appears uncomplicated. 2. Soft tissue swelling with air as seen on recent CT. Electronically Signed   By: Darliss Cheney M.D.   On: 11/05/2021 23:31   VAS [place] LOWER EXTREMITY VENOUS (DVT)  Result Date: 11/03/2021  Lower Venous DVT Study Patient Name:  ANNELLE BEHRENDT  Date of Exam:   11/03/2021 Medical Rec #: [id number]       Accession #:    [id number] Date of Birth: May 23, 1951        Patient Gender: F Patient Age:   21 years Exam Location:  [facility] Procedure:      VAS [place] LOWER EXTREMITY VENOUS (DVT) Referring Phys: Durward Mallard MEMON --------------------------------------------------------------------------------  Indications: Pulmonary embolism. Other Indications: S/P RLE total hip arthroplasty. Limitations: Body habitus, poor ultrasound/tissue interface and bandages. Comparison Study: No previous exams Performing Technologist: Jody Hill RVT, RDMS  Examination Guidelines: A complete evaluation includes B-mode imaging, spectral Doppler, color Doppler, and power Doppler as needed of all accessible portions of each vessel. Bilateral testing is considered an integral part of a complete examination. Limited examinations for reoccurring indications may be performed as noted. The reflux portion of the exam is performed with the patient in reverse Trendelenburg.  +---------+---------------+---------+-----------+----------+--------------+ RIGHT    CompressibilityPhasicitySpontaneityPropertiesThrombus Aging +---------+---------------+---------+-----------+----------+--------------+ CFV  Not visualized  +---------+---------------+---------+-----------+----------+--------------+ SFJ                                                   Not visualized +---------+---------------+---------+-----------+----------+--------------+ FV Prox  Full           Yes      Yes                                 +---------+---------------+---------+-----------+----------+--------------+ FV Mid   Full           Yes      Yes                                 +---------+---------------+---------+-----------+----------+--------------+ FV DistalFull           Yes      Yes                                 +---------+---------------+---------+-----------+----------+--------------+ PFV      Full                                                        +---------+---------------+---------+-----------+----------+--------------+ POP      Full           Yes      Yes                                 +---------+---------------+---------+-----------+----------+--------------+ PTV      Full                                                        +---------+---------------+---------+-----------+----------+--------------+ PERO     Full                                                        +---------+---------------+---------+-----------+----------+--------------+ Surgical Bandage covering area of right groin.  Right Technical Findings: Not visualized segments include CFV & SFJ.  +---------+---------------+---------+-----------+----------+--------------+ LEFT     CompressibilityPhasicitySpontaneityPropertiesThrombus Aging +---------+---------------+---------+-----------+----------+--------------+ CFV      Full           Yes      Yes                                 +---------+---------------+---------+-----------+----------+--------------+ SFJ      Full                                                        +---------+---------------+---------+-----------+----------+--------------+  FV  Prox  Full           Yes      Yes                                 +---------+---------------+---------+-----------+----------+--------------+ FV Mid   Full           Yes      Yes                                 +---------+---------------+---------+-----------+----------+--------------+ FV DistalFull           Yes      Yes                                 +---------+---------------+---------+-----------+----------+--------------+ PFV      Full                                                        +---------+---------------+---------+-----------+----------+--------------+ POP      Full           Yes      Yes                                 +---------+---------------+---------+-----------+----------+--------------+ PTV      Full                                                        +---------+---------------+---------+-----------+----------+--------------+ PERO     Full                                                        +---------+---------------+---------+-----------+----------+--------------+     Summary: BILATERAL: -No evidence of popliteal cyst, bilaterally. RIGHT: - There is no evidence of deep vein thrombosis in the lower extremity. However, portions of this examination were limited- see technologist comments above.  LEFT: - There is no evidence of deep vein thrombosis in the lower extremity.  *See table(s) above for measurements and observations. Electronically signed by Heath Larkhomas Hawken on 11/03/2021 at 4:45:19 PM.    Final     Labs: Recent Labs    11/05/21 1633 11/06/21 0322  WBC 16.3* 13.3*  RBC 2.19* 2.82*  HCT 21.0* 26.2*  PLT 539* 430*   Recent Labs    11/05/21 1633 11/06/21 0322  NA 136 139  K 3.3* 4.0  CL 102 107  CO2 24 26  BUN 38* 30*  CREATININE 0.88 0.72  GLUCOSE 239* 161*  CALCIUM 8.1* 8.0*   Recent Labs    11/05/21 2218  INR 1.3*    Review of Systems: ROS as detailed in HPI  Physical Exam: Body mass index is 33.59  kg/m.  Physical Exam  Gen: AAOx3, NAD Comfortable at rest  Right Lower  Extremity: Skin intact, no erythema/blistering Dressing c/d/i TTP and swollen with bruising over right anterior thigh Compartments soft compressible HF/KE/KF/ADF/APF/EHL 5/5 She is able to active straight leg raise and actively extends the right knee SILT throughout DP, PT 2+ to palp CR < 2s   Assessment and Plan: Ortho consult for right thigh pain s/p right total hip replacement on 10/28/21 in setting of ongoing postoperative anemia secondary to therapeutic anticoagulation (Eliquis) for acute pulmonary embolism  -history, exam and imaging reviewed at length with the patient and her husband at bedside -discussed case with her surgeon as well. No acute surgical intervention -continue postoperative transfusions as needed with serial H&Hs -VTE treatment per ICU/IM team especially in light of her postoperative anemia -WBAT RLE with walker and 1-2 person assist -PT/OT referral -if discharged, plan for follow up as scheduled with Dr. Linna Caprice -continue to monitor exam of the RLE and her H&H  Netta Cedars, MD Orthopaedic Surgeon [facility] [phone]

## 2021-11-07 ENCOUNTER — Encounter (HOSPITAL_COMMUNITY): Payer: Self-pay | Admitting: Internal Medicine

## 2021-11-07 ENCOUNTER — Inpatient Hospital Stay (HOSPITAL_COMMUNITY): Payer: Medicare HMO

## 2021-11-07 DIAGNOSIS — D62 Acute posthemorrhagic anemia: Secondary | ICD-10-CM | POA: Diagnosis not present

## 2021-11-07 DIAGNOSIS — R710 Precipitous drop in hematocrit: Secondary | ICD-10-CM | POA: Diagnosis not present

## 2021-11-07 DIAGNOSIS — Z6833 Body mass index (BMI) 33.0-33.9, adult: Secondary | ICD-10-CM

## 2021-11-07 DIAGNOSIS — R7303 Prediabetes: Secondary | ICD-10-CM | POA: Diagnosis not present

## 2021-11-07 DIAGNOSIS — I2699 Other pulmonary embolism without acute cor pulmonale: Secondary | ICD-10-CM | POA: Diagnosis not present

## 2021-11-07 LAB — BPAM RBC
Blood Product Expiration Date: 202306242359
Blood Product Expiration Date: 202306242359
ISSUE DATE / TIME: 202305272201
ISSUE DATE / TIME: 202305280051
Unit Type and Rh: 6200
Unit Type and Rh: 6200

## 2021-11-07 LAB — COMPREHENSIVE METABOLIC PANEL
ALT: 22 U/L (ref 0–44)
AST: 13 U/L — ABNORMAL LOW (ref 15–41)
Albumin: 2.7 g/dL — ABNORMAL LOW (ref 3.5–5.0)
Alkaline Phosphatase: 58 U/L (ref 38–126)
Anion gap: 5 (ref 5–15)
BUN: 23 mg/dL (ref 8–23)
CO2: 26 mmol/L (ref 22–32)
Calcium: 8.1 mg/dL — ABNORMAL LOW (ref 8.9–10.3)
Chloride: 110 mmol/L (ref 98–111)
Creatinine, Ser: 0.6 mg/dL (ref 0.44–1.00)
GFR, Estimated: >60 mL/min (ref >60)
Glucose, Bld: 113 mg/dL — ABNORMAL HIGH (ref 70–99)
Potassium: 3.9 mmol/L (ref 3.5–5.1)
Sodium: 141 mmol/L (ref 135–145)
Total Bilirubin: 1.5 mg/dL — ABNORMAL HIGH (ref 0.3–1.2)
Total Protein: 5.1 g/dL — ABNORMAL LOW (ref 6.5–8.1)

## 2021-11-07 LAB — GLUCOSE, CAPILLARY
Glucose-Capillary: 120 mg/dL — ABNORMAL HIGH (ref 70–99)
Glucose-Capillary: 121 mg/dL — ABNORMAL HIGH (ref 70–99)
Glucose-Capillary: 122 mg/dL — ABNORMAL HIGH (ref 70–99)
Glucose-Capillary: 92 mg/dL (ref 70–99)

## 2021-11-07 LAB — TYPE AND SCREEN
ABO/RH(D): A POS
Antibody Screen: NEGATIVE
Unit division: 0
Unit division: 0

## 2021-11-07 LAB — CBC
HCT: 25.5 % — ABNORMAL LOW (ref 36.0–46.0)
Hemoglobin: 8 g/dL — ABNORMAL LOW (ref 12.0–15.0)
MCH: 30.8 pg (ref 26.0–34.0)
MCHC: 31.4 g/dL (ref 30.0–36.0)
MCV: 98.1 fL (ref 80.0–100.0)
Platelets: 449 10*3/uL — ABNORMAL HIGH (ref 150–400)
RBC: 2.6 MIL/uL — ABNORMAL LOW (ref 3.87–5.11)
RDW: 16.1 % — ABNORMAL HIGH (ref 11.5–15.5)
WBC: 10.3 10*3/uL (ref 4.0–10.5)
nRBC: 1.1 % — ABNORMAL HIGH (ref 0.0–0.2)

## 2021-11-07 MED ORDER — ASPIRIN 81 MG PO CHEW
81.0000 mg | CHEWABLE_TABLET | Freq: Two times a day (BID) | ORAL | Status: DC
  Administered 2021-11-07 – 2021-11-09 (×4): 81 mg via ORAL
  Filled 2021-11-07 (×4): qty 1

## 2021-11-07 MED ORDER — INSULIN ASPART 100 UNIT/ML IJ SOLN
0.0000 [IU] | Freq: Two times a day (BID) | INTRAMUSCULAR | Status: DC
  Administered 2021-11-08 – 2021-11-09 (×3): 1 [IU] via SUBCUTANEOUS

## 2021-11-07 NOTE — Progress Notes (Signed)
PROGRESS NOTE    Monica Martin  AVW:[id number] DOB: [birth year] DOA: 11/05/2021 PCP: Creola Corn, MD    Brief Narrative:  71 year old female with a history of prediabetes, recently admitted to the hospital for elective right total hip arthroplasty.  Postoperative course was complicated by development of hemorrhagic shock requiring vasopressors and PRBC transfusion.  Was also noted that she had possible acute pulmonary embolus during her hospital stay.  She was discharged on 5/26 on Eliquis.  Her hemoglobin has been stable at that time she was working with physical therapy.  Unfortunately, she was readmitted the following day on 5/27 when she had worsening pain in her right lower extremity and associated chest discomfort and shortness of breath.  Her hemoglobin was checked and noted to be down to 6.7 on readmission.  She was transfused PRBC.  Imaging of her right hip did not show any bony abnormalities.  She was noted to have persistent hematoma.   Assessment & Plan:   Principal Problem:   ABLA (acute blood loss anemia) Active Problems:   Post-operative hemoglobin drop   Acute pulmonary embolus (HCC)   Sinus tachycardia   S/P total hip arthroplasty   Prediabetes   Hyperglycemia   Obesity (BMI 30-39.9)   Acute blood loss anemia   Acute blood loss anemia -Likely related to postoperative blood loss from right hip hematoma/bleeding -Hemoglobin noted to be 8.5 on 5/26.  This dropped down to 6.7 on 5/27 when she was readmitted -She received 2 units PRBCs with improvement in hemoglobin to 8.8 -Continue to follow serial hemoglobin  Status post right total hip arthroplasty Right thigh hematoma -Orthopedics following -Do not feel that evacuation is needed at this time -Continue supportive measures -Keep lower extremity elevated -Apply ice to left hip -checking CT right knee due to right knee pain -PT/OT  Acute pulmonary embolus -Noted on CT scan from 5/23 -Venous Dopplers done  previously noted to be negative -Doppler studies repeated again today and remain negative -I reviewed prior CT from 5/23 with radiology and it was noted that she did have a small subsegmental pulmonary embolus in the right middle lobe -CTA chest repeated today and RML findings have persisted -I also reviewed CTA chest with PCCM and it was noted that subsegmental PE was very small and question arose if it was even clinically significant -currently patient is not hypoxic or short of breath, no chest pain -her hemodynamics have improved -in current setting with recurrent bleeding, with findings of very small PE that does not appear to be clinically significant, we will elect to not treat current CT findings and follow patient clinically -Hopefully as patient improves and is more mobile, her risk of further VTE will also decrease -discussed risk/benefit of holding anticoagulation with patient and husband and they agree with current plan -will start on asa bid for VTE prophylaxis  Nausea and vomiting -last BM was 3 days ago, ? If she is developing ileus -continue antiemetics -will start on IV fluids since po intake has been poor -nausea improving, but no BM yet, continue to follow  Prediabetes -Recent A1c 6.0 -Continue to follow serial blood sugars  Obesity -BMI 33.5 -Noted  Adrenal nodule (HCC) Incidental finding of bilateral adrenal nodule on CT chest -will need repeat CT in 1 year for surveillance   DVT prophylaxis: SCDs Start: 11/05/21 2012, ASA BID  Code Status: full code Family Communication: discussed with husband at the bedside Disposition Plan: Status is: Inpatient The patient will require care spanning >  2 midnights and should be moved to inpatient because: continued monitoring of hemoglobin and anticoagulation     Consultants:  orthopedics  Procedures:    Antimicrobials:      Subjective: She is feeling better today. She is passing flatus, no BM yet.  Tolerating solid food. No shortness of breath  Objective: Vitals:   11/07/21 0500 11/07/21 0600 11/07/21 0700 11/07/21 0800  BP: (!) 132/56 (!) 131/52 131/75 (!) 156/63  Pulse: 95 92 92 (!) 113  Resp: Temp:   97.8 F (36.6 C) 97.8 F (36.6 C)  TempSrc:   Oral Oral  SpO2: 97% 97% 99% 98%  Weight:        Intake/Output Summary (Last 24 hours) at 11/07/2021 1149 Last data filed at 11/07/2021 0800 Gross per 24 hour  Intake 1347.3 ml  Output 650 ml  Net 697.3 ml   Filed Weights   11/05/21 2145  Weight: 94.4 kg    Examination:  General exam: Appears calm and comfortable  Respiratory system: Clear to auscultation. Respiratory effort normal. Cardiovascular system: S1 & S2 heard, RRR. No JVD, murmurs, rubs, gallops or clicks.  Gastrointestinal system: Abdomen is nondistended, soft and nontender. No organomegaly or masses felt. Normal bowel sounds heard. Central nervous system: Alert and oriented. No focal neurological deficits. Extremities: significant swelling of RLE, bruising around right thigh, She has difficulty raising right leg off bed due to pain and weakness, feels as though its heavy Skin: No rashes, lesions or ulcers Psychiatry: Judgement and insight appear normal. Mood & affect appropriate.     Data Reviewed: I have personally reviewed following labs and imaging studies  CBC: Recent Labs  Lab 11/02/21 0229 11/03/21 0243 11/04/21 0622 11/05/21 1633 11/06/21 0322 11/06/21 0624 11/06/21 1053 11/07/21 0306  WBC 10.4   < > 13.3* 16.3* 13.3* 13.2*  --  10.3  NEUTROABS 7.3  --   --  12.7*  --   --   --   --   HGB 8.7*   < > 8.5* 6.7* 8.7* 8.8* 9.3* 8.0*  HCT 26.3*   < > 26.5* 21.0* 26.2* 26.6* 27.8* 25.5*  MCV 93.3   < > 94.6 95.9 92.9 93.0  --  98.1  PLT 306   < > 459* 539* 430* 540*  --  449*   < > = values in this interval not displayed.   Basic Metabolic Panel: Recent Labs  Lab 11/01/21 0610 11/02/21 0229 11/04/21 0622 11/05/21 1633  11/05/21 1839 11/06/21 0322 11/07/21 0306  NA 140 139 140 136  --  139 141  K 3.4* 3.8 3.5 3.3*  --  4.0 3.9  CL 106 107 105 102  --  107 110  CO2 --  26 26  GLUCOSE 153* 137* 157* 239*  --  161* 113*  BUN 12 15 26* 38*  --  30* 23  CREATININE 0.69 0.68 0.68 0.88  --  0.72 0.60  CALCIUM 7.8* 7.7* 8.3* 8.1*  --  8.0* 8.1*  MG 2.1 2.0  --   --  2.4  --   --   PHOS  --  4.2  --   --   --   --   --    GFR: Estimated Creatinine Clearance: 76.8 mL/min (by C-G formula based on SCr of 0.6 mg/dL). Liver Function Tests: Recent Labs  Lab 11/02/21 0229 11/04/21 0622 11/05/21 1633 11/07/21 0306  AST 18 11* 18 13*  ALT  ALKPHOS 51 62 59 58  BILITOT 1.2 1.7* 1.9* 1.5*  PROT 4.6* 5.6* 5.3* 5.1*  ALBUMIN 2.3* 3.0* 2.9* 2.7*   No results for input(s): LIPASE, AMYLASE in the last 168 hours. No results for input(s): AMMONIA in the last 168 hours. Coagulation Profile: Recent Labs  Lab 11/05/21 2218  INR 1.3*   Cardiac Enzymes: No results for input(s): CKTOTAL, CKMB, CKMBINDEX, TROPONINI in the last 168 hours. BNP (last 3 results) No results for input(s): PROBNP in the last 8760 hours. HbA1C: No results for input(s): HGBA1C in the last 72 hours. CBG: Recent Labs  Lab 11/06/21 1618 11/06/21 2006 11/07/21 0002 11/07/21 0339 11/07/21 0700  GLUCAP 116* 161* 92 121* 122*   Lipid Profile: No results for input(s): CHOL, HDL, LDLCALC, TRIG, CHOLHDL, LDLDIRECT in the last 72 hours. Thyroid Function Tests: No results for input(s): TSH, T4TOTAL, FREET4, T3FREE, THYROIDAB in the last 72 hours. Anemia Panel: No results for input(s): VITAMINB12, FOLATE, FERRITIN, TIBC, IRON, RETICCTPCT in the last 72 hours. Sepsis Labs: No results for input(s): PROCALCITON, LATICACIDVEN in the last 168 hours.  Recent Results (from the past 240 hour(s))  MRSA Next Gen by PCR, Nasal     Status: None   Collection Time: 10/28/21  6:25 PM   Specimen: Nasal Mucosa; Nasal Swab   Result Value Ref Range Status   MRSA by PCR Next Gen NOT DETECTED NOT DETECTED Final    Comment: (NOTE) The GeneXpert MRSA Assay (FDA approved for NASAL specimens only), is one component of a comprehensive MRSA colonization surveillance program. It is not intended to diagnose MRSA infection nor to guide or monitor treatment for MRSA infections. Test performance is not FDA approved in patients less than 52 years old. Performed at [facility], [address], [place], [place] [address]          Radiology Studies: CT Angio Chest Pulmonary Embolism (PE) W or WO Contrast  Result Date: 11/06/2021 CLINICAL DATA:  Postoperative pain.  Pulmonary embolism suspected. EXAM: CT ANGIOGRAPHY CHEST WITH CONTRAST TECHNIQUE: Multidetector CT imaging of the chest was performed using the standard protocol during bolus administration of intravenous contrast. Multiplanar CT image reconstructions and MIPs were obtained to evaluate the vascular anatomy. RADIATION DOSE REDUCTION: This exam was performed according to the departmental dose-optimization program which includes automated exposure control, adjustment of the mA and/or kV according to patient size and/or use of iterative reconstruction technique. CONTRAST:  80mL OMNIPAQUE IOHEXOL 350 MG/ML SOLN COMPARISON:  11/01/2021 FINDINGS: Cardiovascular: The heart size is normal. No substantial pericardial effusion. Mild atherosclerotic calcification is noted in the wall of the thoracic aorta. Possible but not definite tiny subsegmental embolus to the left lower lobe (axial 170/6). No other evidence for acute pulmonary embolus. Mediastinum/Nodes: No mediastinal lymphadenopathy. There is no hilar lymphadenopathy. The esophagus has normal imaging features. There is no axillary lymphadenopathy. Lungs/Pleura: No focal airspace consolidation or pulmonary edema. No pleural effusion. Chronic atelectasis or scarring in the right lower lobe is stable. No  suspicious pulmonary nodule or mass. Upper Abdomen: Left hepatic cysts are similar to prior. Duodenal diverticulum noted. Small bilateral adrenal nodules are stable in the interval but cannot be definitively characterized. Musculoskeletal: No worrisome lytic or sclerotic osseous abnormality. Review of the MIP images confirms the above findings. IMPRESSION: 1. Possible but not definite tiny subsegmental embolus to the left lower lobe. No other evidence for acute pulmonary embolus. 2. Chronic atelectasis or scarring in the right lower lobe. 3.  Stable small bilateral adrenal nodules. Recommend 1 year follow up adrenal washout CT. If stable for > 1 year, no further f/u imaging. JACR 2017 Aug; 14(8):1038-44, JCAT 2016 Mar-Apr; 40(2):194-200, Urol J 2006 Spring; 3(2):71-4. 4. Aortic Atherosclerosis (ICD10-I70.0). Electronically Signed   By: Kennith Center M.D.   On: 11/06/2021 12:06   CT KNEE RIGHT WO CONTRAST  Result Date: 11/07/2021 CLINICAL DATA:  Knee trauma, occult fracture suspected. Status post right hip surgery 2 weeks ago. EXAM: CT OF THE RIGHT KNEE WITHOUT CONTRAST TECHNIQUE: Multidetector CT imaging of the right knee was performed according to the standard protocol. Multiplanar CT image reconstructions were also generated. RADIATION DOSE REDUCTION: This exam was performed according to the departmental dose-optimization program which includes automated exposure control, adjustment of the mA and/or kV according to patient size and/or use of iterative reconstruction technique. COMPARISON:  None Available. FINDINGS: Bones/Joint/Cartilage No evidence of fracture or dislocation. Mild tricompartmental knee osteoarthritis. No appreciable joint effusion. Ligaments Suboptimally assessed by CT. Muscles and Tendons Muscles are normal in bulk. The tendons are intact. No intramuscular hematoma or fluid collection. Soft tissues Mild subcutaneous soft tissue fat stranding in the anterior and posterior compartment of the  knee. No fluid collection or hematoma. IMPRESSION: 1. No evidence of fracture or dislocation. 2. Mild subcutaneous soft tissue edema without evidence of drainable fluid collection or hematoma. Electronically Signed   By: Larose Hires D.O.   On: 11/07/2021 11:21   CT Hip Right W and/or Wo Contrast  Result Date: 11/05/2021 CLINICAL DATA:  Status post total right hip arthroplasty. Bleeding and pain. EXAM: CT OF THE LOWER RIGHT EXTREMITY WITHOUT CONTRAST TECHNIQUE: Multidetector CT imaging of the lower right extremity was performed following the standard protocol before and during bolus administration of intravenous contrast. RADIATION DOSE REDUCTION: This exam was performed according to the departmental dose-optimization program which includes automated exposure control, adjustment of the mA and/or kV according to patient size and/or use of iterative reconstruction technique. CONTRAST:  OMNIPAQUE IOHEXOL 300 MG/ML  SOLN COMPARISON:  Pelvis and right hip radiographs 11/05/2021 FINDINGS: Bones/Joint/Cartilage Status post total right hip arthroplasty with single intertrochanteric cerclage wire. Within the limitation of mild metallic streak artifact, no perihardware lucency is seen to indicate hardware loosening. No acute fracture. Mild joint space narrowing and peripheral osteophytosis within the pubic symphysis. There is severe L5-S1 disc space narrowing with anterior bridging osteophytosis and partial osseous fusion. Mild degenerative vacuum phenomenon within the L4-5 disc space. Ligaments Suboptimally assessed by CT. Muscles and Tendons There is a collection of heterogeneous fluid density and scattered air within the deep aspect of the subcutaneous fat of the anterolateral right hip measuring up to approximately 10 cm in greatest transverse dimension, 6 cm in greatest AP dimension, and 16 cm in greatest craniocaudal dimension. Presumably the patient has had a recent surgery, and this may represent a  postoperative hematoma from anterolateral approach. There is also similar lower density edema and fluid within the proximal vastus intermedius and lateralis of the proximal anterior thigh. The rectus abdominis-adductor aponeuroses are grossly intact. Soft tissues Normal appendix. No free fluid within the pelvis. Lobular uterus, likely from uterine fibroids. IMPRESSION: Status post total right hip arthroplasty, presumably recent. There is a heterogeneous collection within the deep aspect of the subcutaneous fat of the anterolateral right thigh and mildly extending into the adjacent anterior quadriceps musculature suggesting a hematoma with scattered air. This may represent a postoperative hematoma from anterolateral approach. Recommend clinical correlation of the timing of the  surgery, as the scattered air may be from very recent surgery, however if the surgery was a few days ago/more remote, this air could represent a superimposed infectious process. Electronically Signed   By: Neita Garnet M.D.   On: 11/05/2021 21:10   DG Hip Unilat W or Wo Pelvis 2-3 Views Right  Result Date: 11/05/2021 CLINICAL DATA:  Pain in right hip following arthroplasty. EXAM: DG HIP (WITH OR WITHOUT PELVIS) 2-3V RIGHT COMPARISON:  None Available. FINDINGS: Postoperative changes from a right total hip arthroplasty identified. There are no signs of periprosthetic fracture or dislocation. Small foci of gas noted within the adjacent soft tissues, likely postoperative. The left hip appears located and intact. Moderate left hip osteoarthritis. IMPRESSION: Status post right total hip arthroplasty. Electronically Signed   By: Signa Kell M.D.   On: 11/05/2021 18:27   DG FEMUR PORT, MIN 2 VIEWS RIGHT  Result Date: 11/05/2021 CLINICAL DATA:  Right hip surgery. EXAM: RIGHT FEMUR PORTABLE 2 VIEW COMPARISON:  Right hip x-ray 11/05/2021. CT of the right hip 11/05/2021. FINDINGS: There is a right hip arthroplasty in anatomic alignment. There  is no fracture or hardware loosening. There is soft tissue swelling lateral to the thigh with a small amount of air, unchanged from prior CT. IMPRESSION: 1. Right hip arthroplasty appears uncomplicated. 2. Soft tissue swelling with air as seen on recent CT. Electronically Signed   By: Darliss Cheney M.D.   On: 11/05/2021 23:31   VAS [place] LOWER EXTREMITY VENOUS (DVT)  Result Date: 11/06/2021  Lower Venous DVT Study Patient Name:  Monica Martin  Date of Exam:   11/06/2021 Medical Rec #: [id number]       Accession #:    [id number] Date of Birth: Jun 11, 1951        Patient Gender: F Patient Age:   33 years Exam Location:  [facility] Procedure:      VAS [place] LOWER EXTREMITY VENOUS (DVT) Referring Phys: Lyda Perone --------------------------------------------------------------------------------  Indications: Increased swelling of RT leg, PE, s/p right total hip arthroplasty.  Comparison Study: 11-03-2021 Prior bilateral lower extremity venous was negative                   for DVT. Performing Technologist: Jean Rosenthal RDMS, RVT  Examination Guidelines: A complete evaluation includes B-mode imaging, spectral Doppler, color Doppler, and power Doppler as needed of all accessible portions of each vessel. Bilateral testing is considered an integral part of a complete examination. Limited examinations for reoccurring indications may be performed as noted. The reflux portion of the exam is performed with the patient in reverse Trendelenburg.  +---------+---------------+---------+-----------+----------+--------------+ RIGHT    CompressibilityPhasicitySpontaneityPropertiesThrombus Aging +---------+---------------+---------+-----------+----------+--------------+ CFV      Full           Yes      Yes                                 +---------+---------------+---------+-----------+----------+--------------+ SFJ      Full                                                         +---------+---------------+---------+-----------+----------+--------------+ FV Prox  Full                                                        +---------+---------------+---------+-----------+----------+--------------+  FV Mid   Full                                                        +---------+---------------+---------+-----------+----------+--------------+ FV DistalFull                                                        +---------+---------------+---------+-----------+----------+--------------+ PFV      Full                                                        +---------+---------------+---------+-----------+----------+--------------+ POP      Full           Yes      Yes                                 +---------+---------------+---------+-----------+----------+--------------+ PTV      Full                                                        +---------+---------------+---------+-----------+----------+--------------+ PERO     Full                                                        +---------+---------------+---------+-----------+----------+--------------+ Gastroc  Full                                                        +---------+---------------+---------+-----------+----------+--------------+ Focal, heterogenous collection visualized on CT Hip RT on 11-05-2021 also visualized on today's examination.  +---------+---------------+---------+-----------+----------+--------------+ LEFT     CompressibilityPhasicitySpontaneityPropertiesThrombus Aging +---------+---------------+---------+-----------+----------+--------------+ CFV      Full           Yes      Yes                                 +---------+---------------+---------+-----------+----------+--------------+ SFJ      Full                                                        +---------+---------------+---------+-----------+----------+--------------+ FV Prox  Full                                                         +---------+---------------+---------+-----------+----------+--------------+  FV Mid   Full                                                        +---------+---------------+---------+-----------+----------+--------------+ FV DistalFull                                                        +---------+---------------+---------+-----------+----------+--------------+ PFV      Full                                                        +---------+---------------+---------+-----------+----------+--------------+ POP      Full           Yes      Yes                                 +---------+---------------+---------+-----------+----------+--------------+ PTV      Full                                                        +---------+---------------+---------+-----------+----------+--------------+ PERO     Full                                                        +---------+---------------+---------+-----------+----------+--------------+ Gastroc  Full                                                        +---------+---------------+---------+-----------+----------+--------------+     Summary: RIGHT: - Findings appear essentially unchanged compared to previous examination on 11-03-2021.  - There is no evidence of deep vein thrombosis in the lower extremity.  - No cystic structure found in the popliteal fossa.  LEFT: - Findings appear essentially unchanged compared to previous examination on 11-03-2021.  - There is no evidence of deep vein thrombosis in the lower extremity.  - No cystic structure found in the popliteal fossa.  *See table(s) above for measurements and observations. Electronically signed by Lemar Livings MD on 11/06/2021 at 4:25:20 PM.    Final         Scheduled Meds:  aspirin  81 mg Oral BID   bisacodyl  10 mg Rectal Once   Chlorhexidine Gluconate Cloth  6 each Topical Daily   insulin aspart   0-9 Units Subcutaneous BID AC   mouth rinse  15 mL Mouth Rinse BID   multivitamin with minerals  1 tablet Oral Daily   pantoprazole  40 mg Oral Daily  Continuous Infusions:  sodium chloride 75 mL/hr at 11/07/21 0800     LOS: 1 day    Time spent:   Erick Blinks, MD [facility]   If 7PM-7AM, please contact night-coverage [email or web address]  11/07/2021, 11:49 AM

## 2021-11-07 NOTE — Progress Notes (Signed)
    Subjective:  Patient reports pain as mild to moderate.  Denies N/V/CP/SOB. Was readmitting for ABLA; apixaban subsequently stopped. Given 2 units PRBCs. States she's feeling better other than R knee pain - x-rays were (-).  Objective:   VITALS:   Vitals:   11/07/21 0500 11/07/21 0600 11/07/21 0700 11/07/21 0800  BP: (!) 132/56 (!) 131/52 131/75 (!) 156/63  Pulse: 95 92 92 (!) 113  Resp: 15 14 16 15   Temp:   97.8 F (36.6 C) 97.8 F (36.6 C)  TempSrc:   Oral Oral  SpO2: 97% 97% 99% 98%  Weight:        NAD ABD soft Sensation intact distally Intact pulses distally Dorsiflexion/Plantar flexion intact Incision: dressing C/D/I Compartment soft Thigh moderately swollen but compressible. Femoral nerve function intact.  Lab Results  Component Value Date   WBC 10.3 11/07/2021   HGB 8.0 (L) 11/07/2021   HCT 25.5 (L) 11/07/2021   MCV 98.1 11/07/2021   PLT 449 (H) 11/07/2021   BMET    Component Value Date/Time   NA 141 11/07/2021 0306   K 3.9 11/07/2021 0306   CL 110 11/07/2021 0306   CO2 26 11/07/2021 0306   GLUCOSE 113 (H) 11/07/2021 0306   BUN 23 11/07/2021 0306   CREATININE 0.60 11/07/2021 0306   CALCIUM 8.1 (L) 11/07/2021 0306   GFRNONAA >60 11/07/2021 0306     Assessment/Plan:     Principal Problem:   ABLA (acute blood loss anemia) Active Problems:   S/P total hip arthroplasty   Prediabetes   Sinus tachycardia   Obesity (BMI 30-39.9)   Acute pulmonary embolus (HCC)   Post-operative hemoglobin drop   Hyperglycemia   Acute blood loss anemia   WBAT with walker DVT ppx:  held due to bleeding , SCDs, TEDS PO pain control PT/OT Dispo: CT R knee ordered to r/o occult fx/SPONK lesion, D/C home when medically ready   11/09/2021 Mamoudou Mulvehill 11/07/2021, 9:59 AM   11/09/2021, MD [phone] [facility] is now [facility]  [place] [address], [address], [place], [place] Phone:  [phone] [email or web address] Facebook  [phone]

## 2021-11-07 NOTE — Progress Notes (Signed)
  Transition of Care [facility]) Screening Note   Patient Details  Name: Monica Martin Date of Birth: [birth year]   Transition of Care [facility]) CM/SW Contact:    Lanier Clam, RN Phone Number: 11/07/2021, 9:48 AM    Transition of Care Department [facility]) has reviewed patient and no TOC needs have been identified at this time. We will continue to monitor patient advancement through interdisciplinary progression rounds. If new patient transition needs arise, please place a TOC consult.

## 2021-11-07 NOTE — Evaluation (Signed)
Physical Therapy Evaluation Patient Details Name: Monica BreamMargie T Buist MRN: [id number] DOB: 02/05/1951 Today's Date: 11/07/2021  History of Present Illness  71 y.o. female with medical history significant of obesity, HLD, arthritis.     Pt underwent THA a few days ago on 5/19.  Post-op course was complicated by 1L EBL during surgery, development of ABLA and hemorrhagic shock post op requiring pressors and transfusions.  Then development of PE, pt started on eliquis.     Eventually pt stabilized with HGB of 8.x for past couple of days, discharged with HGB 8.5 on 11/04/21. On 11/05/21 she was walking back to her bedroom after being up for several hours, when she experienced sudden pain in her entire right leg, and felt faint and had both chest pain and shortness of breath.  States she felt a POP in her r knee at that time.  Imaging showed hematoma R hip, CT knee negative for fx/dislocation.  Clinical Impression  Pt admitted with above diagnosis. Pt ambulated 5' with RW from bed to recliner, distance limited by R knee pain. HR 125 with walking. Reviewed R THA HEP, pt demonstrates good understanding.  Pt currently with functional limitations due to the deficits listed below (see PT Problem List). Pt will benefit from skilled PT to increase their independence and safety with mobility to allow discharge to the venue listed below.          Recommendations for follow up therapy are one component of a multi-disciplinary discharge planning process, led by the attending physician.  Recommendations may be updated based on patient status, additional functional criteria and insurance authorization.  Follow Up Recommendations Home health PT    Assistance Recommended at Discharge Intermittent Supervision/Assistance  Patient can return home with the following  A little help with walking and/or transfers;A little help with bathing/dressing/bathroom;Help with stairs or ramp for entrance;Assistance with  cooking/housework;Assist for transportation    Equipment Recommendations None recommended by PT  Recommendations for Other Services       Functional Status Assessment Patient has had a recent decline in their functional status and demonstrates the ability to make significant improvements in function in a reasonable and predictable amount of time.     Precautions / Restrictions Precautions Precautions: Fall Restrictions RLE Weight Bearing: Weight bearing as tolerated      Mobility  Bed Mobility Overal bed mobility: Modified Independent Bed Mobility: Supine to Sit     Supine to sit: Modified independent (Device/Increase time), HOB elevated     General bed mobility comments: with use of gait belt as leg lifter, no physical assistance required    Transfers Overall transfer level: Needs assistance Equipment used: Rolling walker (2 wheels) Transfers: Sit to/from Stand Sit to Stand: Min guard, From elevated surface           General transfer comment: VCs hand placement    Ambulation/Gait Ambulation/Gait assistance: Min guard Gait Distance (Feet): 5 Feet Assistive device: Rolling walker (2 wheels) Gait Pattern/deviations: Step-to pattern, Decreased weight shift to right       General Gait Details: decr weight shift to R 2* knee pain, HR 125 with taking a few steps from bed to recliner  Stairs            Wheelchair Mobility    Modified Rankin (Stroke Patients Only)       Balance Overall balance assessment: Needs assistance Sitting-balance support: No upper extremity supported, Feet supported Sitting balance-Leahy Scale: Good     Standing balance support: Bilateral upper  extremity supported, During functional activity Standing balance-Leahy Scale: Fair                               Pertinent Vitals/Pain Pain Assessment Pain Score: 5  Pain Location: R medial knee with movement Pain Descriptors / Indicators: Sore, Grimacing Pain  Intervention(s): Limited activity within patient's tolerance, Monitored during session, Ice applied, Premedicated before session    Home Living Family/patient expects to be discharged to:: Private residence Living Arrangements: Spouse/significant other Available Help at Discharge: Family;Available 24 hours/day Type of Home: House Home Access: Stairs to enter Entrance Stairs-Rails: Right Entrance Stairs-Number of Steps: 1 curb   Home Layout: One level Home Equipment: Agricultural consultant (2 wheels) Additional Comments: shower stool    Prior Function Prior Level of Function : Independent/Modified Independent               ADLs Comments: has two cats and one large dog at home     Hand Dominance   Dominant Hand: Right    Extremity/Trunk Assessment   Upper Extremity Assessment RUE Deficits / Details: FF to about 90 degrees with increased time. h/o pain in shoulder at night and with movement. reports she thinks she has RA in it lik her hip but has not been tested LUE Deficits / Details: FF to about 90 degrees with increased time.    Lower Extremity Assessment Lower Extremity Assessment: RLE deficits/detail RLE Deficits / Details: R knee flexion AAROM ~45* limited by medial knee pain, knee ext 3/5 RLE Sensation: WNL RLE Coordination: WNL    Cervical / Trunk Assessment Cervical / Trunk Assessment: Normal  Communication   Communication: No difficulties  Cognition Arousal/Alertness: Awake/alert Behavior During Therapy: WFL for tasks assessed/performed Overall Cognitive Status: Within Functional Limits for tasks assessed                                          General Comments      Exercises Total Joint Exercises Ankle Circles/Pumps: AROM, Both, 10 reps Quad Sets: AROM, Right, 5 reps, Supine Heel Slides: AAROM, Right, 10 reps Long Arc Quad: AROM, Right, 5 reps, Seated Marching in Standing: AROM, Both, 5 reps, Standing   Assessment/Plan    PT  Assessment Patient needs continued PT services  PT Problem List Decreased strength;Decreased range of motion;Decreased knowledge of precautions;Decreased knowledge of use of DME;Decreased activity tolerance;Pain       PT Treatment Interventions DME instruction;Therapeutic activities;Gait training;Therapeutic exercise;Patient/family education;Functional mobility training    PT Goals (Current goals can be found in the Care Plan section)  Acute Rehab PT Goals Patient Stated Goal: go home PT Goal Formulation: With patient/family Time For Goal Achievement: 11/21/21 Potential to Achieve Goals: Good    Frequency       Co-evaluation               AM-PAC PT "6 Clicks" Mobility  Outcome Measure Help needed turning from your back to your side while in a flat bed without using bedrails?: A Little Help needed moving from lying on your back to sitting on the side of a flat bed without using bedrails?: A Little Help needed moving to and from a bed to a chair (including a wheelchair)?: A Little Help needed standing up from a chair using your arms (e.g., wheelchair or bedside chair)?: A Little Help needed  to walk in hospital room?: A Little Help needed climbing 3-5 steps with a railing? : A Little 6 Click Score: 18    End of Session Equipment Utilized During Treatment: Gait belt Activity Tolerance: Patient limited by pain Patient left: in chair;with call bell/phone within reach Nurse Communication: Mobility status PT Visit Diagnosis: Muscle weakness (generalized) (M62.81);Pain;Other abnormalities of gait and mobility (R26.89) Pain - Right/Left: Right Pain - part of body: Hip    Time: 9323-5573 PT Time Calculation (min) (ACUTE ONLY): 36 min   Charges:   PT Evaluation $PT Eval Moderate Complexity: 1 Mod PT Treatments $Therapeutic Activity: 8-22 mins       Ralene Bathe Kistler PT 11/07/2021  Acute Rehabilitation Services Pager [phone] Office [phone]

## 2021-11-08 ENCOUNTER — Encounter (HOSPITAL_COMMUNITY): Payer: Self-pay | Admitting: Internal Medicine

## 2021-11-08 DIAGNOSIS — I2699 Other pulmonary embolism without acute cor pulmonale: Secondary | ICD-10-CM | POA: Diagnosis not present

## 2021-11-08 DIAGNOSIS — D62 Acute posthemorrhagic anemia: Secondary | ICD-10-CM | POA: Diagnosis not present

## 2021-11-08 DIAGNOSIS — R710 Precipitous drop in hematocrit: Secondary | ICD-10-CM | POA: Diagnosis not present

## 2021-11-08 DIAGNOSIS — E669 Obesity, unspecified: Secondary | ICD-10-CM | POA: Diagnosis not present

## 2021-11-08 LAB — GLUCOSE, CAPILLARY
Glucose-Capillary: 131 mg/dL — ABNORMAL HIGH (ref 70–99)
Glucose-Capillary: 133 mg/dL — ABNORMAL HIGH (ref 70–99)
Glucose-Capillary: 141 mg/dL — ABNORMAL HIGH (ref 70–99)

## 2021-11-08 LAB — CBC
HCT: 24.9 % — ABNORMAL LOW (ref 36.0–46.0)
Hemoglobin: 7.7 g/dL — ABNORMAL LOW (ref 12.0–15.0)
MCH: 30.3 pg (ref 26.0–34.0)
MCHC: 30.9 g/dL (ref 30.0–36.0)
MCV: 98 fL (ref 80.0–100.0)
Platelets: 465 10*3/uL — ABNORMAL HIGH (ref 150–400)
RBC: 2.54 MIL/uL — ABNORMAL LOW (ref 3.87–5.11)
RDW: 15.9 % — ABNORMAL HIGH (ref 11.5–15.5)
WBC: 9.9 10*3/uL (ref 4.0–10.5)
nRBC: 0.8 % — ABNORMAL HIGH (ref 0.0–0.2)

## 2021-11-08 LAB — BASIC METABOLIC PANEL
Anion gap: 6 (ref 5–15)
BUN: 17 mg/dL (ref 8–23)
CO2: 25 mmol/L (ref 22–32)
Calcium: 7.8 mg/dL — ABNORMAL LOW (ref 8.9–10.3)
Chloride: 109 mmol/L (ref 98–111)
Creatinine, Ser: 0.75 mg/dL (ref 0.44–1.00)
GFR, Estimated: >60 mL/min (ref >60)
Glucose, Bld: 136 mg/dL — ABNORMAL HIGH (ref 70–99)
Potassium: 3.4 mmol/L — ABNORMAL LOW (ref 3.5–5.1)
Sodium: 140 mmol/L (ref 135–145)

## 2021-11-08 MED ORDER — POTASSIUM CHLORIDE CRYS ER 20 MEQ PO TBCR
40.0000 meq | EXTENDED_RELEASE_TABLET | ORAL | Status: AC
  Administered 2021-11-08 (×2): 40 meq via ORAL
  Filled 2021-11-08 (×2): qty 2

## 2021-11-08 NOTE — Progress Notes (Signed)
PROGRESS NOTE    Monica Martin  ZOX:[id number] DOB: 12-10-1950 DOA: 11/05/2021 PCP: Creola Corn, MD    Brief Narrative:  71 year old female with a history of prediabetes, recently admitted to the hospital for elective right total hip arthroplasty.  Postoperative course was complicated by development of hemorrhagic shock requiring vasopressors and PRBC transfusion.  Was also noted that she had possible acute pulmonary embolus during her hospital stay.  She was discharged on 5/26 on Eliquis.  Her hemoglobin has been stable at that time she was working with physical therapy.  Unfortunately, she was readmitted the following day on 5/27 when she had worsening pain in her right lower extremity and associated chest discomfort and shortness of breath.  Her hemoglobin was checked and noted to be down to 6.7 on readmission.  She was transfused PRBC.  Imaging of her right hip did not show any bony abnormalities.  She was noted to have persistent hematoma.   Assessment & Plan:   Principal Problem:   ABLA (acute blood loss anemia) Active Problems:   Post-operative hemoglobin drop   Acute pulmonary embolus (HCC)   Sinus tachycardia   S/P total hip arthroplasty   Prediabetes   Hyperglycemia   Obesity (BMI 30-39.9)   Acute blood loss anemia   Acute blood loss anemia -Likely related to postoperative blood loss from right hip hematoma/bleeding -Hemoglobin noted to be 8.5 on 5/26.  This dropped down to 6.7 on 5/27 when she was readmitted -She received 2 units PRBCs with improvement in hemoglobin to 8.8 -Hgb has since trended down to 7.7, repeat in AM -clinically, swelling in RLE improving. -Continue to follow serial hemoglobin  Status post right total hip arthroplasty Right thigh hematoma -Orthopedics following -Do not feel that evacuation is needed at this time -Continue supportive measures -Keep lower extremity elevated -Apply ice to left hip -CT right knee without any fractures or  disclocations -PT/OT  Acute pulmonary embolus -Noted on CT scan from 5/23 -Venous Dopplers done previously noted to be negative -Doppler repeated again this admission and remain negative -I reviewed prior CT from 5/23 with radiology and it was confirmed that she did have a small subsegmental pulmonary embolus in the right middle lobe -CTA chest repeated during this admission and RML subsegmental PE persists -possible LLL findings mentioned in CT report were equivocal and likely artifact per radiology -I also reviewed CTA chest with PCCM and it was noted that subsegmental PE in RML was very small and the question arose if it was even clinically significant? -currently patient is not hypoxic or short of breath, no chest pain -her hemodynamics have improved -in the current setting of recurrent bleeding, with findings of very small PE that does not appear to be clinically significant, we will elect to not treat current CT findings and follow patient clinically -Hopefully as patient improves and is more mobile, her risk of further VTE will also decrease -discussed risk/benefit of holding anticoagulation with patient and husband and they agree with current plan -clinically she seems to be doing well -started on asa bid for VTE prophylaxis  Nausea and vomiting -last BM was several days ago, ? If she is developing ileus -continue antiemetics prn -nausea and vomiting have since resolved and oral intake improving -no BM yet, but she is passing flatus -offered suppository, but she wishes to hold off for now  Prediabetes -Recent A1c 6.0 -Continue to follow serial blood sugars  Obesity -BMI 33.5 -Noted  Adrenal nodule (HCC) Incidental finding of bilateral  adrenal nodule on CT chest -will need repeat CT in 1 year for surveillance   DVT prophylaxis: SCDs Start: 11/05/21 2012, ASA BID  Code Status: full code Family Communication: discussed with husband at the bedside 5/30 Disposition Plan:  Status is: Inpatient The patient will require care spanning > 2 midnights and should be moved to inpatient because: continued monitoring of hemoglobin and anticoagulation     Consultants:  orthopedics  Procedures:    Antimicrobials:      Subjective: Feels that swelling and pain in right leg is better today. No BM yet  Objective: Vitals:   11/08/21 0300 11/08/21 0400 11/08/21 0500 11/08/21 0800  BP:    (!) 151/59  Pulse: 97 94 95 95  Resp: Temp:  98 F (36.7 C)  98.4 F (36.9 C)  TempSrc:  Oral  Oral  SpO2: 95% 97% 94% 96%  Weight:      Height:        Intake/Output Summary (Last 24 hours) at 11/08/2021 0948 Last data filed at 11/08/2021 1308 Gross per 24 hour  Intake 1166.2 ml  Output --  Net 1166.2 ml   Filed Weights   11/05/21 2145 11/07/21 2000  Weight: 94.4 kg 94.4 kg    Examination:  General exam: Appears calm and comfortable  Respiratory system: Clear to auscultation. Respiratory effort normal. Cardiovascular system: S1 & S2 heard, RRR. No JVD, murmurs, rubs, gallops or clicks.  Gastrointestinal system: Abdomen is nondistended, soft and nontender. No organomegaly or masses felt. Normal bowel sounds heard. Central nervous system: Alert and oriented. No focal neurological deficits. Extremities: swelling in RLE improving, particularly right knee Skin: No rashes, lesions or ulcers Psychiatry: Judgement and insight appear normal. Mood & affect appropriate.     Data Reviewed: I have personally reviewed following labs and imaging studies  CBC: Recent Labs  Lab 11/02/21 0229 11/03/21 0243 11/05/21 1633 11/06/21 0322 11/06/21 0624 11/06/21 1053 11/07/21 0306 11/08/21 0227  WBC 10.4   < > 16.3* 13.3* 13.2*  --  10.3 9.9  NEUTROABS 7.3  --  12.7*  --   --   --   --   --   HGB 8.7*   < > 6.7* 8.7* 8.8* 9.3* 8.0* 7.7*  HCT 26.3*   < > 21.0* 26.2* 26.6* 27.8* 25.5* 24.9*  MCV 93.3   < > 95.9 92.9 93.0  --  98.1 98.0  PLT 306   < > 539*  430* 540*  --  449* 465*   < > = values in this interval not displayed.   Basic Metabolic Panel: Recent Labs  Lab 11/02/21 0229 11/04/21 0622 11/05/21 1633 11/05/21 1839 11/06/21 0322 11/07/21 0306 11/08/21 0227  NA 139 140 136  --  139 141 140  K 3.8 3.5 3.3*  --  4.0 3.9 3.4*  CL 107 105 102  --  107 110 109  CO2 --  GLUCOSE 137* 157* 239*  --  161* 113* 136*  BUN 15 26* 38*  --  30* 23 17  CREATININE 0.68 0.68 0.88  --  0.72 0.60 0.75  CALCIUM 7.7* 8.3* 8.1*  --  8.0* 8.1* 7.8*  MG 2.0  --   --  2.4  --   --   --   PHOS 4.2  --   --   --   --   --   --    GFR: Estimated Creatinine Clearance:  76.8 mL/min (by C-G formula based on SCr of 0.75 mg/dL). Liver Function Tests: Recent Labs  Lab 11/02/21 0229 11/04/21 0622 11/05/21 1633 11/07/21 0306  AST 18 11* 18 13*  ALT 21 19 25 22   ALKPHOS 51 62 59 58  BILITOT 1.2 1.7* 1.9* 1.5*  PROT 4.6* 5.6* 5.3* 5.1*  ALBUMIN 2.3* 3.0* 2.9* 2.7*   No results for input(s): LIPASE, AMYLASE in the last 168 hours. No results for input(s): AMMONIA in the last 168 hours. Coagulation Profile: Recent Labs  Lab 11/05/21 2218  INR 1.3*   Cardiac Enzymes: No results for input(s): CKTOTAL, CKMB, CKMBINDEX, TROPONINI in the last 168 hours. BNP (last 3 results) No results for input(s): PROBNP in the last 8760 hours. HbA1C: No results for input(s): HGBA1C in the last 72 hours. CBG: Recent Labs  Lab 11/07/21 0339 11/07/21 0700 11/07/21 1620 11/08/21 0653 11/08/21 0751  GLUCAP 121* 122* 120* 131* 133*   Lipid Profile: No results for input(s): CHOL, HDL, LDLCALC, TRIG, CHOLHDL, LDLDIRECT in the last 72 hours. Thyroid Function Tests: No results for input(s): TSH, T4TOTAL, FREET4, T3FREE, THYROIDAB in the last 72 hours. Anemia Panel: No results for input(s): VITAMINB12, FOLATE, FERRITIN, TIBC, IRON, RETICCTPCT in the last 72 hours. Sepsis Labs: No results for input(s): PROCALCITON, LATICACIDVEN in the last 168  hours.  No results found for this or any previous visit (from the past 240 hour(s)).        Radiology Studies: CT Angio Chest Pulmonary Embolism (PE) W or WO Contrast  Result Date: 11/06/2021 CLINICAL DATA:  Postoperative pain.  Pulmonary embolism suspected. EXAM: CT ANGIOGRAPHY CHEST WITH CONTRAST TECHNIQUE: Multidetector CT imaging of the chest was performed using the standard protocol during bolus administration of intravenous contrast. Multiplanar CT image reconstructions and MIPs were obtained to evaluate the vascular anatomy. RADIATION DOSE REDUCTION: This exam was performed according to the departmental dose-optimization program which includes automated exposure control, adjustment of the mA and/or kV according to patient size and/or use of iterative reconstruction technique. CONTRAST:  69mL OMNIPAQUE IOHEXOL 350 MG/ML SOLN COMPARISON:  11/01/2021 FINDINGS: Cardiovascular: The heart size is normal. No substantial pericardial effusion. Mild atherosclerotic calcification is noted in the wall of the thoracic aorta. Possible but not definite tiny subsegmental embolus to the left lower lobe (axial 170/6). No other evidence for acute pulmonary embolus. Mediastinum/Nodes: No mediastinal lymphadenopathy. There is no hilar lymphadenopathy. The esophagus has normal imaging features. There is no axillary lymphadenopathy. Lungs/Pleura: No focal airspace consolidation or pulmonary edema. No pleural effusion. Chronic atelectasis or scarring in the right lower lobe is stable. No suspicious pulmonary nodule or mass. Upper Abdomen: Left hepatic cysts are similar to prior. Duodenal diverticulum noted. Small bilateral adrenal nodules are stable in the interval but cannot be definitively characterized. Musculoskeletal: No worrisome lytic or sclerotic osseous abnormality. Review of the MIP images confirms the above findings. IMPRESSION: 1. Possible but not definite tiny subsegmental embolus to the left lower lobe. No  other evidence for acute pulmonary embolus. 2. Chronic atelectasis or scarring in the right lower lobe. 3. Stable small bilateral adrenal nodules. Recommend 1 year follow up adrenal washout CT. If stable for > 1 year, no further f/u imaging. JACR 2017 Aug; 14(8):1038-44, JCAT 2016 Mar-Apr; 40(2):194-200, Urol J 2006 Spring; 3(2):71-4. 4. Aortic Atherosclerosis (ICD10-I70.0). Electronically Signed   By: 07-16-1975 M.D.   On: 11/06/2021 12:06   CT KNEE RIGHT WO CONTRAST  Result Date: 11/07/2021 CLINICAL DATA:  Knee trauma, occult fracture suspected. Status post  right hip surgery 2 weeks ago. EXAM: CT OF THE RIGHT KNEE WITHOUT CONTRAST TECHNIQUE: Multidetector CT imaging of the right knee was performed according to the standard protocol. Multiplanar CT image reconstructions were also generated. RADIATION DOSE REDUCTION: This exam was performed according to the departmental dose-optimization program which includes automated exposure control, adjustment of the mA and/or kV according to patient size and/or use of iterative reconstruction technique. COMPARISON:  None Available. FINDINGS: Bones/Joint/Cartilage No evidence of fracture or dislocation. Mild tricompartmental knee osteoarthritis. No appreciable joint effusion. Ligaments Suboptimally assessed by CT. Muscles and Tendons Muscles are normal in bulk. The tendons are intact. No intramuscular hematoma or fluid collection. Soft tissues Mild subcutaneous soft tissue fat stranding in the anterior and posterior compartment of the knee. No fluid collection or hematoma. IMPRESSION: 1. No evidence of fracture or dislocation. 2. Mild subcutaneous soft tissue edema without evidence of drainable fluid collection or hematoma. Electronically Signed   By: Larose HiresImran  Ahmed D.O.   On: 11/07/2021 11:21   VAS US LOWER EXTREMITY VENOUS (DVT)  Result Date: 11/06/2021  Lower Venous DVT Study Patient Name:  Jaynie BreamMARGIE T Doby  Date of Exam:   11/06/2021 Medical Rec #: [id number]        Accession #:    [id number] Date of Birth: 10/13/1950        Patient Gender: F Patient Age:   [age] years [facility] Procedure:      VAS US LOWER EXTREMITY VENOUS (DVT) Referring Phys: Lyda PeroneJARED GARDNER --------------------------------------------------------------------------------  Indications: Increased swelling of RT leg, PE, s/p right total hip arthroplasty.  Comparison Study: 11-03-2021 Prior bilateral lower extremity venous was negative                   for DVT. Performing Technologist: Jean Rosenthalachel Hodge RDMS, RVT  Examination Guidelines: A complete evaluation includes B-mode imaging, spectral Doppler, color Doppler, and power Doppler as needed of all accessible portions of each vessel. Bilateral testing is considered an integral part of a complete examination. Limited examinations for reoccurring indications may be performed as noted. The reflux portion of the exam is performed with the patient in reverse Trendelenburg.  +---------+---------------+---------+-----------+----------+--------------+ RIGHT    CompressibilityPhasicitySpontaneityPropertiesThrombus Aging +---------+---------------+---------+-----------+----------+--------------+ CFV      Full           Yes      Yes                                 +---------+---------------+---------+-----------+----------+--------------+ SFJ      Full                                                        +---------+---------------+---------+-----------+----------+--------------+ FV Prox  Full                                                        +---------+---------------+---------+-----------+----------+--------------+ FV Mid   Full                                                        +---------+---------------+---------+-----------+----------+--------------+  FV DistalFull                                                        +---------+---------------+---------+-----------+----------+--------------+ PFV       Full                                                        +---------+---------------+---------+-----------+----------+--------------+ POP      Full           Yes      Yes                                 +---------+---------------+---------+-----------+----------+--------------+ PTV      Full                                                        +---------+---------------+---------+-----------+----------+--------------+ PERO     Full                                                        +---------+---------------+---------+-----------+----------+--------------+ Gastroc  Full                                                        +---------+---------------+---------+-----------+----------+--------------+ Focal, heterogenous collection visualized on CT Hip RT on 11-05-2021 also visualized on today's examination.  +---------+---------------+---------+-----------+----------+--------------+ LEFT     CompressibilityPhasicitySpontaneityPropertiesThrombus Aging +---------+---------------+---------+-----------+----------+--------------+ CFV      Full           Yes      Yes                                 +---------+---------------+---------+-----------+----------+--------------+ SFJ      Full                                                        +---------+---------------+---------+-----------+----------+--------------+ FV Prox  Full                                                        +---------+---------------+---------+-----------+----------+--------------+ FV Mid   Full                                                        +---------+---------------+---------+-----------+----------+--------------+  FV DistalFull                                                        +---------+---------------+---------+-----------+----------+--------------+ PFV      Full                                                         +---------+---------------+---------+-----------+----------+--------------+ POP      Full           Yes      Yes                                 +---------+---------------+---------+-----------+----------+--------------+ PTV      Full                                                        +---------+---------------+---------+-----------+----------+--------------+ PERO     Full                                                        +---------+---------------+---------+-----------+----------+--------------+ Gastroc  Full                                                        +---------+---------------+---------+-----------+----------+--------------+     Summary: RIGHT: - Findings appear essentially unchanged compared to previous examination on 11-03-2021.  - There is no evidence of deep vein thrombosis in the lower extremity.  - No cystic structure found in the popliteal fossa.  LEFT: - Findings appear essentially unchanged compared to previous examination on 11-03-2021.  - There is no evidence of deep vein thrombosis in the lower extremity.  - No cystic structure found in the popliteal fossa.  *See table(s) above for measurements and observations. Electronically signed by Lemar Livings MD on 11/06/2021 at 4:25:20 PM.    Final         Scheduled Meds:  aspirin  81 mg Oral BID   bisacodyl  10 mg Rectal Once   Chlorhexidine Gluconate Cloth  6 each Topical Daily   insulin aspart  0-9 Units Subcutaneous BID AC   mouth rinse  15 mL Mouth Rinse BID   multivitamin with minerals  1 tablet Oral Daily   pantoprazole  40 mg Oral Daily   Continuous Infusions:     LOS: 2 days    Time spent:   Erick Blinks, MD [facility]   If 7PM-7AM, please contact night-coverage [email or web address]  11/08/2021, 9:48 AM

## 2021-11-08 NOTE — Evaluation (Signed)
Occupational Therapy Evaluation Patient Details Name: Monica BreamMargie T Martin MRN: [id number] DOB: 02/20/1951 Today's Date: 11/08/2021   History of Present Illness 71 y.o. female with medical history significant of obesity, HLD, arthritis.     Pt underwent THA a few days ago on 5/19.  Post-op course was complicated by 1L EBL during surgery, development of ABLA and hemorrhagic shock post op requiring pressors and transfusions.  Then development of PE, pt started on eliquis.     Eventually pt stabilized with HGB of 8.x for past couple of days, discharged with HGB 8.5 on 11/04/21. On 11/05/21 she was walking back to her bedroom after being up for several hours, when she experienced sudden pain in her entire right leg, and felt faint and had both chest pain and shortness of breath.  States she felt a POP in her r knee at that time.  Imaging showed hematoma R hip, CT knee negative for fx/dislocation.   Clinical Impression   Patient evaluated by Occupational Therapy with no further acute OT needs identified. All education has been completed and the patient has no further questions. Pt reports that she and her husband have done well at home and she had a shower which worked well with their new tub transfer bench. See below for any follow-up Occupational Therapy or equipment needs. OT is signing off. Thank you for this referral.     Recommendations for follow up therapy are one component of a multi-disciplinary discharge planning process, led by the attending physician.  Recommendations may be updated based on patient status, additional functional criteria and insurance authorization.   Follow Up Recommendations  Follow physician's recommendations for discharge plan and follow up therapies    Assistance Recommended at Discharge Frequent or constant Supervision/Assistance  Patient can return home with the following A little help with walking and/or transfers;A little help with bathing/dressing/bathroom;Help with stairs  or ramp for entrance;Assist for transportation;Assistance with cooking/housework    Functional Status Assessment  Patient has not had a recent decline in their functional status (Progressing since discharge home on 5/26)  Equipment Recommendations  None recommended by OT    Recommendations for Other Services       Precautions / Restrictions Precautions Precautions: Fall Precaution Comments: monitor HR/ BP Restrictions Weight Bearing Restrictions: Yes RLE Weight Bearing: Weight bearing as tolerated      Mobility Bed Mobility Overal bed mobility:  (Pt up in recliner)                  Transfers                          Balance Overall balance assessment: Mild deficits observed, not formally tested                                         ADL either performed or assessed with clinical judgement   ADL   Eating/Feeding: Modified independent;Sitting   Grooming: Standing;Wash/dry hands;Supervision/safety   Upper Body Bathing: Set up;Sitting   Lower Body Bathing: Minimal assistance;Sitting/lateral leans   Upper Body Dressing : Sitting;Set up   Lower Body Dressing: Sitting/lateral leans;Sit to/from stand;Moderate assistance Lower Body Dressing Details (indicate cue type and reason): Spouse assisting at home and pt verblaized that they did receive education on long handled adaptive equipment from OT on previous recent admission. Pt denied any other questions re: LB  dressing. Toilet Transfer: Supervision/safety;BSC/3in1;Ambulation;Rolling walker (2 wheels) Toilet Transfer Details (indicate cue type and reason): Pt and spouse shown how to angle and position BSC over toilet for ease. Pt/spouse educated on use of plastic grocery bag to ling bucket of BSC for easy cleanup when used by the bed. Toileting- Clothing Manipulation and Hygiene: Supervision/safety;Sitting/lateral lean       Functional mobility during ADLs: Supervision/safety;Rolling  walker (2 wheels)       Vision Patient Visual Report: No change from baseline       Perception     Praxis      Pertinent Vitals/Pain Pain Assessment Pain Assessment: No/denies pain Pain Location: RT hip with ambulation described as "tight" but pt denying pain throughout session Pain Descriptors / Indicators: Tightness Pain Intervention(s): Limited activity within patient's tolerance, Monitored during session, Premedicated before session, Repositioned, Ice applied     Hand Dominance Right   Extremity/Trunk Assessment Upper Extremity Assessment RUE Deficits / Details: FF to about 90 degrees with increased time. h/o pain in shoulder at night and with movement. reports she thinks she has RA in it like her hip but has not been tested LUE Deficits / Details: FF to about 90 degrees with increased time.   Lower Extremity Assessment RLE Deficits / Details: R knee flexion AAROM ~45* limited by medial knee pain, knee ext 3/5 RLE Sensation: WNL RLE Coordination: WNL   Cervical / Trunk Assessment Cervical / Trunk Assessment: Normal   Communication Communication Communication: No difficulties   Cognition Arousal/Alertness: Awake/alert Behavior During Therapy: WFL for tasks assessed/performed Overall Cognitive Status: Within Functional Limits for tasks assessed                                       General Comments       Exercises     Shoulder Instructions      Home Living Family/patient expects to be discharged to:: Private residence Living Arrangements: Spouse/significant other Available Help at Discharge: Family;Available 24 hours/day Type of Home: House Home Access: Stairs to enter [employer] of Steps: 1 curb Entrance Stairs-Rails: Right Home Layout: One level     Bathroom Shower/Tub: Tub only         Home Equipment: Agricultural consultant (2 wheels);Tub bench;BSC/3in1   Additional Comments: shower stool      Prior  Functioning/Environment Prior Level of Function : Independent/Modified Independent               ADLs Comments: has two cats and one large dog at home. Took a shower during her day home and stated it went very well with use of new tub bench. Pt has been using her BSC by the bed.        OT Problem List: Decreased activity tolerance;Impaired balance (sitting and/or standing);Cardiopulmonary status limiting activity      OT Treatment/Interventions:      OT Goals(Current goals can be found in the care plan section) Acute Rehab OT Goals OT Goal Formulation: All assessment and education complete, DC therapy  OT Frequency:      Co-evaluation PT/OT/SLP Co-Evaluation/Treatment: Yes Reason for Co-Treatment: For patient/therapist safety;To address functional/ADL transfers PT goals addressed during session: Mobility/safety with mobility OT goals addressed during session: ADL's and self-care      AM-PAC OT "6 Clicks" Daily Activity     Outcome Measure Help from another person eating meals?: None Help from another person taking care of  personal grooming?: A Little Help from another person toileting, which includes using toliet, bedpan, or urinal?: A Little Help from another person bathing (including washing, rinsing, drying)?: A Little Help from another person to put on and taking off regular upper body clothing?: A Little Help from another person to put on and taking off regular lower body clothing?: A Little 6 Click Score: 19   End of Session Equipment Utilized During Treatment: Rolling walker (2 wheels);Gait belt Nurse Communication: Mobility status  Activity Tolerance: Patient tolerated treatment well Patient left: in chair;with family/visitor present  OT Visit Diagnosis: Unsteadiness on feet (R26.81)                Time: 9604-5409 OT Time Calculation (min): 26 min Charges:  OT General Charges $OT Visit: 1 Visit OT Evaluation $OT Eval Low Complexity: 1 Low  Mervil Wacker,  OT Acute Rehab Services Office: [phone] 11/08/2021  Theodoro Clock 11/08/2021, 10:53 AM

## 2021-11-08 NOTE — Progress Notes (Signed)
Physical Therapy Treatment Patient Details Name: Monica Martin MRN: [id number] DOB: 11-01-1950 Today's Date: 11/08/2021   History of Present Illness 71 y.o. female with medical history significant of obesity, HLD, arthritis.     Pt underwent  a direct anterior approach  THA on 5/19.  Post-op course was complicated by 1L EBL during surgery, development of ABLA and hemorrhagic shock post op requiring pressors and transfusions.  Then development of PE, pt started on eliquis.     Eventually pt stabilized with HGB of 8.x for past couple of days, discharged with HGB 8.5 on 11/04/21. On 11/05/21 she was walking back to her bedroom after being up for several hours, when she experienced sudden pain in her entire right leg, and felt faint and had both chest pain and shortness of breath.  States she felt a POP in her r knee at that time.  Imaging showed hematoma R hip, CT knee negative for fx/dislocation.    PT Comments    The patient reports feeling improved. Patient ambulated x 80' x 2 using Rw. HR 110- 137.  Patient not noted to be as SOB as prior to her previous DC  . Patient reports right leg tightness, no right knee pain.  Continue PT.  Recommendations for follow up therapy are one component of a multi-disciplinary discharge planning process, led by the attending physician.  Recommendations may be updated based on patient status, additional functional criteria and insurance authorization.  Follow Up Recommendations   (patient declines HH)     Assistance Recommended at Discharge Intermittent Supervision/Assistance  Patient can return home with the following A little help with walking and/or transfers;A little help with bathing/dressing/bathroom;Help with stairs or ramp for entrance;Assistance with cooking/housework;Assist for transportation   Equipment Recommendations       Recommendations for Other Services       Precautions / Restrictions Precautions Precautions: Fall Precaution Comments:  monitor HR/ BP Restrictions Weight Bearing Restrictions: Yes RLE Weight Bearing: Weight bearing as tolerated     Mobility  Bed Mobility               General bed mobility comments: in recliner,with use of gait belt as leg lifter, no physical assistance required    Transfers Overall transfer level: Needs assistance Equipment used: Rolling walker (2 wheels) Transfers: Sit to/from Stand Sit to Stand: Min guard, From elevated surface           General transfer comment: VCs hand placement    Ambulation/Gait Ambulation/Gait assistance: Min guard Gait Distance (Feet): 80 Feet (x 2) Assistive device: Rolling walker (2 wheels) Gait Pattern/deviations: Step-through pattern Gait velocity: decr     General Gait Details: decr weight shift to R 2* knee pain, HR 137 max   Stairs             Wheelchair Mobility    Modified Rankin (Stroke Patients Only)       Balance Overall balance assessment: Mild deficits observed, not formally tested                                          Cognition Arousal/Alertness: Awake/alert                                              Exercises  General Comments        Pertinent Vitals/Pain Pain Assessment Faces Pain Scale: No hurt Pain Location: RT hip with ambulation described as "tight" but pt denying pain throughout session Pain Descriptors / Indicators: Tightness    Home Living Family/patient expects to be discharged to:: Private residence Living Arrangements: Spouse/significant other Available Help at Discharge: Family;Available 24 hours/day Type of Home: House Home Access: Stairs to enter Entrance Stairs-Rails: Right Entrance Stairs-Number of Steps: 1 curb   Home Layout: One level Home Equipment: Agricultural consultant (2 wheels);Tub bench;BSC/3in1 Additional Comments: shower stool    Prior Function            PT Goals (current goals can now be found in the care plan  section) Progress towards PT goals: Progressing toward goals    Frequency    7X/week      PT Plan Current plan remains appropriate    Co-evaluation PT/OT/SLP Co-Evaluation/Treatment: Yes Reason for Co-Treatment: To address functional/ADL transfers;For patient/therapist safety PT goals addressed during session: Mobility/safety with mobility OT goals addressed during session: ADL's and self-care      AM-PAC PT "6 Clicks" Mobility   Outcome Measure  Help needed turning from your back to your side while in a flat bed without using bedrails?: A Little Help needed moving from lying on your back to sitting on the side of a flat bed without using bedrails?: A Little Help needed moving to and from a bed to a chair (including a wheelchair)?: A Little Help needed standing up from a chair using your arms (e.g., wheelchair or bedside chair)?: A Little Help needed to walk in hospital room?: A Little Help needed climbing 3-5 steps with a railing? : A Little 6 Click Score: 18    End of Session   Activity Tolerance: Patient tolerated treatment well Patient left: in chair;with call bell/phone within reach;with family/visitor present Nurse Communication: Mobility status PT Visit Diagnosis: Muscle weakness (generalized) (M62.81);Pain;Other abnormalities of gait and mobility (R26.89) Pain - Right/Left: Right Pain - part of body: Hip     Time: 0950-1016 PT Time Calculation (min) (ACUTE ONLY): 26 min  Charges:  $Gait Training: 8-22 mins                     Blanchard Kelch PT Acute Rehabilitation Services Pager [phone] Office [phone]    Rada Hay 11/08/2021, 11:50 AM

## 2021-11-08 NOTE — Progress Notes (Signed)
Report given to 3W RN. Pt notified of transfer. Pt stable at the time of transfer.

## 2021-11-08 NOTE — Progress Notes (Signed)
Physical Therapy Treatment Patient Details Name: AARON BRISON MRN: [id number] DOB: December 30, 1950 Today's Date: 11/08/2021   History of Present Illness 71 y.o. female with medical history significant of obesity, HLD, arthritis.     Pt underwent  a direct anterior approach  THA on 5/19.  Post-op course was complicated by 1L EBL during surgery, development of ABLA and hemorrhagic shock post op requiring pressors and transfusions.  Then development of PE, pt started on eliquis.     Eventually pt stabilized with HGB of 8.x for past couple of days, discharged with HGB 8.5 on 11/04/21. On 11/05/21 she was walking back to her bedroom after being up for several hours, when she experienced sudden pain in her entire right leg, and felt faint and had both chest pain and shortness of breath.  States she felt a POP in her r knee at that time.  Imaging showed hematoma R hip, CT knee negative for fx/dislocation.    PT Comments    Patient improving in distance, 100' x 2, HR 119 upon return to room .(RN gave clearance to take off monitor to ambulate so  not monitored while ambulating).  Patient hopes to DC home tomorrow. Patient states that she does not need HHPT.  Recommendations for follow up therapy are one component of a multi-disciplinary discharge planning process, led by the attending physician.  Recommendations may be updated based on patient status, additional functional criteria and insurance authorization.  Follow Up Recommendations  No PT follow up     Assistance Recommended at Discharge Intermittent Supervision/Assistance  Patient can return home with the following A little help with walking and/or transfers;A little help with bathing/dressing/bathroom;Help with stairs or ramp for entrance;Assistance with cooking/housework;Assist for transportation   Equipment Recommendations  None recommended by PT    Recommendations for Other Services       Precautions / Restrictions Precautions Precautions:  Fall Precaution Comments: monitor HR/ Restrictions Weight Bearing Restrictions: Yes RLE Weight Bearing: Weight bearing as tolerated     Mobility  Bed Mobility   Bed Mobility: Supine to Sit, Sit to Supine     Supine to sit: Modified independent (Device/Increase time), HOB elevated     General bed mobility comments: use of leg lifter/belt for right leg    Transfers Overall transfer level: Needs assistance Equipment used: Rolling walker (2 wheels) Transfers: Sit to/from Stand Sit to Stand: Min guard           General transfer comment: VCs hand placement    Ambulation/Gait Ambulation/Gait assistance: Min guard Gait Distance (Feet): 100 Feet (x 2) Assistive device: Rolling walker (2 wheels) Gait Pattern/deviations: Step-through pattern Gait velocity: decr     General Gait Details: decr weight shift to R 2* knee pain, HR 119 after return to room   Stairs             Wheelchair Mobility    Modified Rankin (Stroke Patients Only)       Balance Overall balance assessment: Mild deficits observed, not formally tested Sitting-balance support: No upper extremity supported, Feet supported Sitting balance-Leahy Scale: Good     Standing balance support: Bilateral upper extremity supported, During functional activity Standing balance-Leahy Scale: Fair Standing balance comment: BUE support on RW                            Cognition Arousal/Alertness: Awake/alert  Exercises      General Comments        Pertinent Vitals/Pain Pain Assessment Pain Assessment: No/denies pain Faces Pain Scale: No hurt Pain Location: RT hip with ambulation described as "tight" but pt denying pain throughout session Pain Descriptors / Indicators: Tightness    Home Living Family/patient expects to be discharged to:: Private residence Living Arrangements: Spouse/significant other Available Help at  Discharge: Family;Available 24 hours/day Type of Home: House Home Access: Stairs to enter Entrance Stairs-Rails: Right Entrance Stairs-Number of Steps: 1 curb   Home Layout: One level Home Equipment: Conservation officer, nature (2 wheels);Tub bench;BSC/3in1 Additional Comments: shower stool    Prior Function            PT Goals (current goals can now be found in the care plan section) Progress towards PT goals: Progressing toward goals    Frequency    7X/week      PT Plan Current plan remains appropriate    Co-evaluation PT/OT/SLP Co-Evaluation/Treatment: Yes Reason for Co-Treatment: To address functional/ADL transfers;For patient/therapist safety PT goals addressed during session: Mobility/safety with mobility OT goals addressed during session: ADL's and self-care      AM-PAC PT "6 Clicks" Mobility   Outcome Measure  Help needed turning from your back to your side while in a flat bed without using bedrails?: A Little Help needed moving from lying on your back to sitting on the side of a flat bed without using bedrails?: A Little Help needed moving to and from a bed to a chair (including a wheelchair)?: A Little Help needed standing up from a chair using your arms (e.g., wheelchair or bedside chair)?: A Little Help needed to walk in hospital room?: A Little Help needed climbing 3-5 steps with a railing? : A Little 6 Click Score: 18    End of Session Equipment Utilized During Treatment: Gait belt Activity Tolerance: Patient tolerated treatment well Patient left: in bed;with call bell/phone within reach Nurse Communication: Mobility status PT Visit Diagnosis: Muscle weakness (generalized) (M62.81);Other abnormalities of gait and mobility (R26.89);Difficulty in walking, not elsewhere classified (R26.2) Pain - Right/Left: Right Pain - part of body: Hip     Time: 1328-1400 PT Time Calculation (min) (ACUTE ONLY): 32 min  Charges:  $Gait Training: 23-37 mins                      [place] Pager [phone] Office [phone]    Claretha Cooper 11/08/2021, 2:31 PM

## 2021-11-08 NOTE — Progress Notes (Signed)
    Subjective:  Patient reports pain as mild to moderate.  Denies N/V/CP/SOB/Dizziness. Patient is still having RT knee pain, x-rays were negative. CT scan was ordered of RT knee to r/o occult fx - no evidence of fracture or dislocation. Mild tricompartmental knee osteoarthritis.   Objective:   VITALS:   Vitals:   11/08/21 0200 11/08/21 0300 11/08/21 0400 11/08/21 0500  BP:      Pulse: (!) 102 97 94 95  Resp: (!) 6 20 15 15   Temp:   98 F (36.7 C)   TempSrc:   Oral   SpO2: 96% 95% 97% 94%  Weight:      Height:        Patient lying in bed. NAD. Gait belt on Rt leg.  Neurologically intact Neurovascular intact Sensation intact distally Intact pulses distally Dorsiflexion/Plantar flexion intact No cellulitis present Compartment soft Dressings:  -Aquacel dressing covering incision.  - Healing blister formation just superior to bandage and lateral and a small one just distal to the aquacel dressing, no longer draining. No signs of infection. Bruising in distal thigh.  Moderate thigh swelling but compressible.   Lab Results  Component Value Date   WBC 9.9 11/08/2021   HGB 7.7 (L) 11/08/2021   HCT 24.9 (L) 11/08/2021   MCV 98.0 11/08/2021   PLT 465 (H) 11/08/2021   BMET    Component Value Date/Time   NA 140 11/08/2021 0227   K 3.4 (L) 11/08/2021 0227   CL 109 11/08/2021 0227   CO2 25 11/08/2021 0227   GLUCOSE 136 (H) 11/08/2021 0227   BUN 17 11/08/2021 0227   CREATININE 0.75 11/08/2021 0227   CALCIUM 7.8 (L) 11/08/2021 0227   GFRNONAA >60 11/08/2021 0227     Assessment/Plan:     Principal Problem:   ABLA (acute blood loss anemia) Active Problems:   S/P total hip arthroplasty   Prediabetes   Sinus tachycardia   Obesity (BMI 30-39.9)   Acute pulmonary embolus (HCC)   Post-operative hemoglobin drop   Hyperglycemia   Acute blood loss anemia  ABLA hemoglobin 7.7 this morning. Continue to monitor. Transfuse if hemoglobin <7.0.  WBAT with walker DVT ppx:  Aspirin 81mg  BID, held eliquis due to bleeding, SCDs, TEDS PO pain control: Tylenol and flexeril.  PT/OT: Patient ambulated with PT yesterday 5 feet but was limited due to right knee pain. Continue PT.  Dispo: D/c when medically ready.     0228, PA-C 11/08/2021, 7:42 AM  [facility]  [place] [address], [place], [address] [place] Phone: [phone] [email or web address] Facebook  [email or web address]

## 2021-11-09 DIAGNOSIS — D62 Acute posthemorrhagic anemia: Secondary | ICD-10-CM | POA: Diagnosis not present

## 2021-11-09 LAB — GLUCOSE, CAPILLARY: Glucose-Capillary: 122 mg/dL — ABNORMAL HIGH (ref 70–99)

## 2021-11-09 LAB — CBC
HCT: 28.2 % — ABNORMAL LOW (ref 36.0–46.0)
Hemoglobin: 8.7 g/dL — ABNORMAL LOW (ref 12.0–15.0)
MCH: 30.1 pg (ref 26.0–34.0)
MCHC: 30.9 g/dL (ref 30.0–36.0)
MCV: 97.6 fL (ref 80.0–100.0)
Platelets: 548 10*3/uL — ABNORMAL HIGH (ref 150–400)
RBC: 2.89 MIL/uL — ABNORMAL LOW (ref 3.87–5.11)
RDW: 16 % — ABNORMAL HIGH (ref 11.5–15.5)
WBC: 11.6 10*3/uL — ABNORMAL HIGH (ref 4.0–10.5)
nRBC: 0.8 % — ABNORMAL HIGH (ref 0.0–0.2)

## 2021-11-09 MED ORDER — ASPIRIN 81 MG PO CHEW: 81.0000 mg | CHEWABLE_TABLET | Freq: Two times a day (BID) | ORAL | 0 refills | Status: AC

## 2021-11-09 NOTE — Discharge Summary (Signed)
Physician Discharge Summary  Monica Martin [id number] DOB: 04-Mar-1951 DOA: 11/05/2021  PCP: Shon Baton, MD  Admit date: 11/05/2021 Discharge date: 11/09/2021  Admitted From: home Discharge disposition: home   Recommendations for Outpatient Follow-Up:   Asa BID per ortho Cbc 1 week Incidental finding of bilateral adrenal nodule on CT chest  -will need repeat CT in 1 year for surveillance   Discharge Diagnosis:   Principal Problem:   ABLA (acute blood loss anemia) Active Problems:   Post-operative hemoglobin drop   Acute pulmonary embolus (HCC)   Sinus tachycardia   S/P total hip arthroplasty   Prediabetes   Hyperglycemia   Obesity (BMI 30-39.9)   Acute blood loss anemia    Discharge Condition: Improved.  Diet recommendation:   Regular.  Wound care: None.  Code status: Full.   History of Present Illness:   Monica Martin is a 71 y.o. female with medical history significant of obesity, HLD, arthritis.   Pt underwent THA a few days ago on 5/19.  Post-op course was complicated by 1L EBL during surgery, development of ABLA and hemorrhagic shock post op requiring pressors and transfusions.  Then development of PE, pt started on eliquis.   Eventually pt stabilized with HGB of 8.x for past couple of days, discharged with HGB 8.5 yesterday.   Today she was walking back to her bedroom after being up for several hours, when she experienced sudden pain in her entire right leg, and felt faint and had both chest pain and shortness of breath.   States she felt a POP in her leg at that time.   She was able to get back to her bed, where she laid down and EMS was then summoned to transfer her here for evaluation.  She had been apparently using low-dose hydrocodone, one half of a 5 mg tablet, with Tylenol, sporadically.  She has been supplementing this with extra Tylenol when she takes it.   Leg and thigh is significantly more swollen per pt and husband than it was  this AM   Hospital Course by Problem:   Acute blood loss anemia -Likely related to postoperative blood loss from right hip hematoma/bleeding -Hemoglobin noted to be 8.5 on 5/26.  This dropped down to 6.7 on 5/27 when she was readmitted -She received 2 units PRBCs with improvement in hemoglobin to 8.8   Status post right total hip arthroplasty Right thigh hematoma -Orthopedics following -Do not feel that evacuation is needed at this time -Continue supportive measures -Keep lower extremity elevated -Apply ice to left hip -CT right knee without any fractures or disclocations -PT/OT- no follow up   Acute pulmonary embolus -Noted on CT scan from 5/23 -Venous Dopplers done previously noted to be negative -Doppler repeated again this admission and remain negative -per Dr. Hillard Danker:  I reviewed prior CT from 5/23 with radiology and it was confirmed that she did have a small subsegmental pulmonary embolus in the right middle lobe -CTA chest repeated during this admission and RML subsegmental PE persists -possible LLL findings mentioned in CT report were equivocal and likely artifact per radiology -I also reviewed CTA chest with PCCM and it was noted that subsegmental PE in RML was very small and the question arose if it was even clinically significant? -currently patient is not hypoxic or short of breath, no chest pain -her hemodynamics have improved -in the current setting of recurrent bleeding, with findings of very small PE that does not appear to  be clinically significant, we will elect to not treat current CT findings and follow patient clinically -Hopefully as patient improves and is more mobile, her risk of further VTE will also decrease -discussed risk/benefit of holding anticoagulation with patient and husband and they agree with current plan -clinically she seems to be doing well -started on asa bid for VTE prophylaxis   Nausea and vomiting -resolved   Prediabetes -Recent A1c  6.0 -outpatient follow up   Obesity -Estimated body mass index is 33.59 kg/m as calculated from the following:   Height as of this encounter: 5\' 6"  (1.676 m).   Weight as of this encounter: 94.4 kg.    Adrenal nodule (HCC) Incidental finding of bilateral adrenal nodule on CT chest -will need repeat CT in 1 year for surveillance    Medical Consultants:   ortho   Discharge Exam:   Vitals:   11/08/21 2201 11/09/21 0630  BP: 139/62 (!) 148/74  Pulse: (!) 108 100  Resp: 17 17  Temp: 98.3 F (36.8 C) 98.2 F (36.8 C)  SpO2: 97% 95%   Vitals:   11/08/21 1600 11/08/21 1700 11/08/21 2201 11/09/21 0630  BP: (!) 141/59  139/62 (!) 148/74  Pulse: (!) 105 (!) 106 (!) 108 100  Resp: 11 19 17 17   Temp:   98.3 F (36.8 C) 98.2 F (36.8 C)  TempSrc:   Oral Oral  SpO2: 100% 100% 97% 95%  Weight:      Height:        General exam: Appears calm and comfortable.   The results of significant diagnostics from this hospitalization (including imaging, microbiology, ancillary and laboratory) are listed below for reference.     Procedures and Diagnostic Studies:   CT Angio Chest Pulmonary Embolism (PE) W or WO Contrast  Result Date: 11/06/2021 CLINICAL DATA:  Postoperative pain.  Pulmonary embolism suspected. EXAM: CT ANGIOGRAPHY CHEST WITH CONTRAST TECHNIQUE: Multidetector CT imaging of the chest was performed using the standard protocol during bolus administration of intravenous contrast. Multiplanar CT image reconstructions and MIPs were obtained to evaluate the vascular anatomy. RADIATION DOSE REDUCTION: This exam was performed according to the departmental dose-optimization program which includes automated exposure control, adjustment of the mA and/or kV according to patient size and/or use of iterative reconstruction technique. CONTRAST:  56mL OMNIPAQUE IOHEXOL 350 MG/ML SOLN COMPARISON:  11/01/2021 FINDINGS: Cardiovascular: The heart size is normal. No substantial pericardial  effusion. Mild atherosclerotic calcification is noted in the wall of the thoracic aorta. Possible but not definite tiny subsegmental embolus to the left lower lobe (axial 170/6). No other evidence for acute pulmonary embolus. Mediastinum/Nodes: No mediastinal lymphadenopathy. There is no hilar lymphadenopathy. The esophagus has normal imaging features. There is no axillary lymphadenopathy. Lungs/Pleura: No focal airspace consolidation or pulmonary edema. No pleural effusion. Chronic atelectasis or scarring in the right lower lobe is stable. No suspicious pulmonary nodule or mass. Upper Abdomen: Left hepatic cysts are similar to prior. Duodenal diverticulum noted. Small bilateral adrenal nodules are stable in the interval but cannot be definitively characterized. Musculoskeletal: No worrisome lytic or sclerotic osseous abnormality. Review of the MIP images confirms the above findings. IMPRESSION: 1. Possible but not definite tiny subsegmental embolus to the left lower lobe. No other evidence for acute pulmonary embolus. 2. Chronic atelectasis or scarring in the right lower lobe. 3. Stable small bilateral adrenal nodules. Recommend 1 year follow up adrenal washout CT. If stable for > 1 year, no further f/u imaging. JACR 2017 Aug; 14(8):1038-44, JCAT 2016  Mar-Apr; 40(2):194-200, Urol J 2006 Spring; 3(2):71-4. 4. Aortic Atherosclerosis (ICD10-I70.0). Electronically Signed   By: Misty Stanley M.D.   On: 11/06/2021 12:06   CT Hip Right W and/or Wo Contrast  Result Date: 11/05/2021 CLINICAL DATA:  Status post total right hip arthroplasty. Bleeding and pain. EXAM: CT OF THE LOWER RIGHT EXTREMITY WITHOUT CONTRAST TECHNIQUE: Multidetector CT imaging of the lower right extremity was performed following the standard protocol before and during bolus administration of intravenous contrast. RADIATION DOSE REDUCTION: This exam was performed according to the departmental dose-optimization program which includes automated  exposure control, adjustment of the mA and/or kV according to patient size and/or use of iterative reconstruction technique. CONTRAST:  11mL OMNIPAQUE IOHEXOL 300 MG/ML  SOLN COMPARISON:  Pelvis and right hip radiographs 11/05/2021 FINDINGS: Bones/Joint/Cartilage Status post total right hip arthroplasty with single intertrochanteric cerclage wire. Within the limitation of mild metallic streak artifact, no perihardware lucency is seen to indicate hardware loosening. No acute fracture. Mild joint space narrowing and peripheral osteophytosis within the pubic symphysis. There is severe L5-S1 disc space narrowing with anterior bridging osteophytosis and partial osseous fusion. Mild degenerative vacuum phenomenon within the L4-5 disc space. Ligaments Suboptimally assessed by CT. Muscles and Tendons There is a collection of heterogeneous fluid density and scattered air within the deep aspect of the subcutaneous fat of the anterolateral right hip measuring up to approximately 10 cm in greatest transverse dimension, 6 cm in greatest AP dimension, and 16 cm in greatest craniocaudal dimension. Presumably the patient has had a recent surgery, and this may represent a postoperative hematoma from anterolateral approach. There is also similar lower density edema and fluid within the proximal vastus intermedius and lateralis of the proximal anterior thigh. The rectus abdominis-adductor aponeuroses are grossly intact. Soft tissues Normal appendix. No free fluid within the pelvis. Lobular uterus, likely from uterine fibroids. IMPRESSION: Status post total right hip arthroplasty, presumably recent. There is a heterogeneous collection within the deep aspect of the subcutaneous fat of the anterolateral right thigh and mildly extending into the adjacent anterior quadriceps musculature suggesting a hematoma with scattered air. This may represent a postoperative hematoma from anterolateral approach. Recommend clinical correlation of the  timing of the surgery, as the scattered air may be from very recent surgery, however if the surgery was a few days ago/more remote, this air could represent a superimposed infectious process. Electronically Signed   By: Yvonne Kendall M.D.   On: 11/05/2021 21:10   DG Hip Unilat W or Wo Pelvis 2-3 Views Right  Result Date: 11/05/2021 CLINICAL DATA:  Pain in right hip following arthroplasty. EXAM: DG HIP (WITH OR WITHOUT PELVIS) 2-3V RIGHT COMPARISON:  None Available. FINDINGS: Postoperative changes from a right total hip arthroplasty identified. There are no signs of periprosthetic fracture or dislocation. Small foci of gas noted within the adjacent soft tissues, likely postoperative. The left hip appears located and intact. Moderate left hip osteoarthritis. IMPRESSION: Status post right total hip arthroplasty. Electronically Signed   By: Kerby Moors M.D.   On: 11/05/2021 18:27   DG FEMUR PORT, MIN 2 VIEWS RIGHT  Result Date: 11/05/2021 CLINICAL DATA:  Right hip surgery. EXAM: RIGHT FEMUR PORTABLE 2 VIEW COMPARISON:  Right hip x-ray 11/05/2021. CT of the right hip 11/05/2021. FINDINGS: There is a right hip arthroplasty in anatomic alignment. There is no fracture or hardware loosening. There is soft tissue swelling lateral to the thigh with a small amount of air, unchanged from prior CT. IMPRESSION: 1. Right hip  arthroplasty appears uncomplicated. 2. Soft tissue swelling with air as seen on recent CT. Electronically Signed   By: Ronney Asters M.D.   On: 11/05/2021 23:31   VAS [place] LOWER EXTREMITY VENOUS (DVT)  Result Date: 11/06/2021  Lower Venous DVT Study Patient Name:  Monica Martin  Date of Exam:   11/06/2021 Medical Rec #: [id number]       Accession #:    [id number] Date of Birth: Jun 22, 1950        Patient Gender: F Patient Age:   25 years Exam Location:  [facility] [place] Procedure:      VAS [place] LOWER EXTREMITY VENOUS (DVT) Referring Phys: Jennette Kettle  --------------------------------------------------------------------------------  Indications: Increased swelling of RT leg, PE, s/p right total hip arthroplasty.  Comparison Study: 11-03-2021 Prior bilateral lower extremity venous was negative                   for DVT. Performing Technologist: Darlin Coco RDMS, RVT  Examination Guidelines: A complete evaluation includes B-mode imaging, spectral Doppler, color Doppler, and power Doppler as needed of all accessible portions of each vessel. Bilateral testing is considered an integral part of a complete examination. Limited examinations for reoccurring indications may be performed as noted. The reflux portion of the exam is performed with the patient in reverse Trendelenburg.  +---------+---------------+---------+-----------+----------+--------------+ RIGHT    CompressibilityPhasicitySpontaneityPropertiesThrombus Aging +---------+---------------+---------+-----------+----------+--------------+ CFV      Full           Yes      Yes                                 +---------+---------------+---------+-----------+----------+--------------+ SFJ      Full                                                        +---------+---------------+---------+-----------+----------+--------------+ FV Prox  Full                                                        +---------+---------------+---------+-----------+----------+--------------+ FV Mid   Full                                                        +---------+---------------+---------+-----------+----------+--------------+ FV DistalFull                                                        +---------+---------------+---------+-----------+----------+--------------+ PFV      Full                                                        +---------+---------------+---------+-----------+----------+--------------+ POP  Full           Yes      Yes                                  +---------+---------------+---------+-----------+----------+--------------+ PTV      Full                                                        +---------+---------------+---------+-----------+----------+--------------+ PERO     Full                                                        +---------+---------------+---------+-----------+----------+--------------+ Gastroc  Full                                                        +---------+---------------+---------+-----------+----------+--------------+ Focal, heterogenous collection visualized on CT Hip RT on 11-05-2021 also visualized on today's examination.  +---------+---------------+---------+-----------+----------+--------------+ LEFT     CompressibilityPhasicitySpontaneityPropertiesThrombus Aging +---------+---------------+---------+-----------+----------+--------------+ CFV      Full           Yes      Yes                                 +---------+---------------+---------+-----------+----------+--------------+ SFJ      Full                                                        +---------+---------------+---------+-----------+----------+--------------+ FV Prox  Full                                                        +---------+---------------+---------+-----------+----------+--------------+ FV Mid   Full                                                        +---------+---------------+---------+-----------+----------+--------------+ FV DistalFull                                                        +---------+---------------+---------+-----------+----------+--------------+ PFV      Full                                                        +---------+---------------+---------+-----------+----------+--------------+  POP      Full           Yes      Yes                                 +---------+---------------+---------+-----------+----------+--------------+ PTV       Full                                                        +---------+---------------+---------+-----------+----------+--------------+ PERO     Full                                                        +---------+---------------+---------+-----------+----------+--------------+ Gastroc  Full                                                        +---------+---------------+---------+-----------+----------+--------------+     Summary: RIGHT: - Findings appear essentially unchanged compared to previous examination on 11-03-2021.  - There is no evidence of deep vein thrombosis in the lower extremity.  - No cystic structure found in the popliteal fossa.  LEFT: - Findings appear essentially unchanged compared to previous examination on 11-03-2021.  - There is no evidence of deep vein thrombosis in the lower extremity.  - No cystic structure found in the popliteal fossa.  *See table(s) above for measurements and observations. Electronically signed by Servando Snare MD on 11/06/2021 at 4:25:20 PM.    Final      Labs:   Basic Metabolic Panel: Recent Labs  Lab 11/04/21 0622 11/05/21 1633 11/05/21 1839 11/06/21 0322 11/07/21 0306 11/08/21 0227  NA 140 136  --  139 141 140  K 3.5 3.3*  --  4.0 3.9 3.4*  CL 105 102  --  107 110 109  CO2 26 24  --  26 26 25   GLUCOSE 157* 239*  --  161* 113* 136*  BUN 26* 38*  --  30* 23 17  CREATININE 0.68 0.88  --  0.72 0.60 0.75  CALCIUM 8.3* 8.1*  --  8.0* 8.1* 7.8*  MG  --   --  2.4  --   --   --    GFR Estimated Creatinine Clearance: 76.8 mL/min (by C-G formula based on SCr of 0.75 mg/dL). Liver Function Tests: Recent Labs  Lab 11/04/21 0622 11/05/21 1633 11/07/21 0306  AST 11* 18 13*  ALT 19 25 22   ALKPHOS 62 59 58  BILITOT 1.7* 1.9* 1.5*  PROT 5.6* 5.3* 5.1*  ALBUMIN 3.0* 2.9* 2.7*   No results for input(s): LIPASE, AMYLASE in the last 168 hours. No results for input(s): AMMONIA in the last 168 hours. Coagulation profile Recent  Labs  Lab 11/05/21 2218  INR 1.3*    CBC: Recent Labs  Lab 11/05/21 1633 11/06/21 0322 11/06/21 0624 11/06/21 1053 11/07/21 0306 11/08/21 0227 11/09/21 0332  WBC 16.3* 13.3* 13.2*  --  10.3 9.9 11.6*  NEUTROABS 12.7*  --   --   --   --   --   --  HGB 6.7* 8.7* 8.8* 9.3* 8.0* 7.7* 8.7*  HCT 21.0* 26.2* 26.6* 27.8* 25.5* 24.9* 28.2*  MCV 95.9 92.9 93.0  --  98.1 98.0 97.6  PLT 539* 430* 540*  --  449* 465* 548*   Cardiac Enzymes: No results for input(s): CKTOTAL, CKMB, CKMBINDEX, TROPONINI in the last 168 hours. BNP: Invalid input(s): POCBNP CBG: Recent Labs  Lab 11/07/21 1620 11/08/21 0653 11/08/21 0751 11/08/21 1552 11/09/21 0722  GLUCAP 120* 131* 133* 141* 122*   D-Dimer No results for input(s): DDIMER in the last 72 hours. Hgb A1c No results for input(s): HGBA1C in the last 72 hours. Lipid Profile No results for input(s): CHOL, HDL, LDLCALC, TRIG, CHOLHDL, LDLDIRECT in the last 72 hours. Thyroid function studies No results for input(s): TSH, T4TOTAL, T3FREE, THYROIDAB in the last 72 hours.  Invalid input(s): FREET3 Anemia work up No results for input(s): VITAMINB12, FOLATE, FERRITIN, TIBC, IRON, RETICCTPCT in the last 72 hours. Microbiology No results found for this or any previous visit (from the past 240 hour(s)).   Discharge Instructions:   Discharge Instructions     Diet general   Complete by: As directed    Discharge wound care:   Complete by: As directed    Per ortho   Increase activity slowly   Complete by: As directed       Allergies as of 11/09/2021   No Known Allergies      Medication List     STOP taking these medications    apixaban 5 MG Tabs tablet Commonly known as: Eliquis   co-enzyme Q-10 30 MG capsule   cyclobenzaprine 10 MG tablet Commonly known as: FLEXERIL   fish oil-omega-3 fatty acids 1000 MG capsule   glucosamine-chondroitin 500-400 MG tablet   meloxicam 15 MG tablet Commonly known as: MOBIC        TAKE these medications    acetaminophen 500 MG tablet Commonly known as: TYLENOL Take 2 tablets (1,000 mg total) by mouth every 8 (eight) hours as needed for moderate pain, mild pain or fever. What changed: when to take this   ARTIFICIAL TEARS OP Place 1 drop into both eyes 3 (three) times daily.   aspirin 81 MG chewable tablet Chew 1 tablet (81 mg total) by mouth 2 (two) times daily. What changed: when to take this   fluticasone 50 MCG/ACT nasal spray Commonly known as: FLONASE Place 1 spray into both nostrils daily as needed for allergies or rhinitis.   HYDROcodone-acetaminophen 10-325 MG tablet Commonly known as: NORCO Take 0.5 tablets by mouth every 4 (four) hours as needed (pain).   methocarbamol 500 MG tablet Commonly known as: ROBAXIN Take 500 mg by mouth every 6 (six) hours as needed.   multivitamin with minerals tablet Take 1 tablet by mouth daily. centrum   multivitamin-lutein Caps capsule Take 1 capsule by mouth daily.   ondansetron 4 MG tablet Commonly known as: ZOFRAN Take 4 mg by mouth every 6 (six) hours as needed.   OVER THE COUNTER MEDICATION Take 1 tablet by mouth daily. Astragalus propinquus   TUMS PO Take 4 tablets by mouth daily as needed (acid reflux).               Discharge Care Instructions  (From admission, onward)           Start     Ordered   11/09/21 0000  Discharge wound care:       Comments: Per ortho   11/09/21 [id number]  Time coordinating discharge: 35  Signed:  Geradine Girt DO  [facility] 11/09/2021, 8:23 AM

## 2021-11-09 NOTE — Plan of Care (Signed)
Pt ready to DC home with husband 

## 2021-11-09 NOTE — Progress Notes (Signed)
    Subjective:  Patient reports pain as mild she has only been having tylenol for pain. Denies N/V/CP/SOB/Dizziness/Abd pain. She has not had a bowel movement.. Patient states she had a really good day with therapy yesterday and is feeling much better. She is ready to get back home. She states the pain in her knee is improving after she iced her leg a lot yesterday.   Objective:   VITALS:   Vitals:   11/08/21 1600 11/08/21 1700 11/08/21 2201 11/09/21 0630  BP: (!) 141/59  139/62 (!) 148/74  Pulse: (!) 105 (!) 106 (!) 108 100  Resp: 11 19 17 17   Temp:   98.3 F (36.8 C) 98.2 F (36.8 C)  TempSrc:   Oral Oral  SpO2: 100% 100% 97% 95%  Weight:      Height:        Patient lying in bed. NAD. Neurologically intact ABD soft Neurovascular intact Sensation intact distally Intact pulses distally Dorsiflexion/Plantar flexion intact No cellulitis present Compartment soft Dressings:  -Aquacel dressing covering incision.  - Healing blister formation just superior to bandage and lateral and a small one just distal to the aquacel dressing, no longer draining. No signs of infection. Bruising in distal thigh.  Moderate thigh swelling but compressible.   Lab Results  Component Value Date   WBC 11.6 (H) 11/09/2021   HGB 8.7 (L) 11/09/2021   HCT 28.2 (L) 11/09/2021   MCV 97.6 11/09/2021   PLT 548 (H) 11/09/2021   BMET    Component Value Date/Time   NA 140 11/08/2021 0227   K 3.4 (L) 11/08/2021 0227   CL 109 11/08/2021 0227   CO2 25 11/08/2021 0227   GLUCOSE 136 (H) 11/08/2021 0227   BUN 17 11/08/2021 0227   CREATININE 0.75 11/08/2021 0227   CALCIUM 7.8 (L) 11/08/2021 0227   GFRNONAA >60 11/08/2021 0227     Assessment/Plan:     Principal Problem:   ABLA (acute blood loss anemia) Active Problems:   S/P total hip arthroplasty   Prediabetes   Sinus tachycardia   Obesity (BMI 30-39.9)   Acute pulmonary embolus (HCC)   Post-operative hemoglobin drop   Hyperglycemia    Acute blood loss anemia  ABLA hemoglobin 8.7 this morning. Continue to monitor. Transfuse if hemoglobin <7.0.  WBAT with walker DVT ppx: Aspirin 81mg  BID due to bleeding, SCDs, TEDS PO pain control PT/OT: Patient ambulated with PT yesterday 160 ft and 220ft. She denied any dizziness with ambulation.  Dispo: D/c when medically ready.    Charlott Rakes, PA-C 11/09/2021, 7:04 AM  [facility]  [facility] [address], [address], [place], [place] [address] Phone: [phone] [email or web address]  [employer]

## 2021-11-09 NOTE — Progress Notes (Signed)
Physical Therapy Treatment Patient Details Name: Monica Martin MRN: [id number] DOB: 07/13/1950 Today's Date: 11/09/2021   History of Present Illness 71 y.o. female with medical history significant of obesity, HLD, arthritis.     Pt underwent  a direct anterior approach  THA on 5/19.  Post-op course was complicated by 1L EBL during surgery, development of ABLA and hemorrhagic shock post op requiring pressors and transfusions.  Then development of PE, pt started on eliquis.     Eventually pt stabilized with HGB of 8.x for past couple of days, discharged with HGB 8.5 on 11/04/21. On 11/05/21 she was walking back to her bedroom after being up for several hours, when she experienced sudden pain in her entire right leg, and felt faint and had both chest pain and shortness of breath.  States she felt a POP in her r knee at that time.  Imaging showed hematoma R hip, CT knee negative for fx/dislocation.    PT Comments    Pt doing well amb to and from bathroom with her walker with Spouse.  Dressed and eager to D/C to home this morning. Assisted with practicing one step/curb pt has to enter home.  Instructed on using gait belt to self assist LE on/off bed.  Addressed all mobility questions, discussed appropriate activity, educated on use of ICE.  Pt ready for D/C to home.   Recommendations for follow up therapy are one component of a multi-disciplinary discharge planning process, led by the attending physician.  Recommendations may be updated based on patient status, additional functional criteria and insurance authorization.  Follow Up Recommendations  No PT follow up     Assistance Recommended at Discharge Intermittent Supervision/Assistance  Patient can return home with the following A little help with walking and/or transfers;A little help with bathing/dressing/bathroom;Help with stairs or ramp for entrance;Assistance with cooking/housework;Assist for transportation   Equipment Recommendations  None  recommended by PT    Recommendations for Other Services       Precautions / Restrictions Precautions Precautions: Fall Restrictions Weight Bearing Restrictions: No RLE Weight Bearing: Weight bearing as tolerated     Mobility  Bed Mobility               General bed mobility comments: sitting EOB on arrival with Spouse in room    Transfers Overall transfer level: Needs assistance Equipment used: Rolling walker (2 wheels) Transfers: Sit to/from Stand Sit to Stand: Supervision           General transfer comment: one VC on safety with turns    Ambulation/Gait Ambulation/Gait assistance: Supervision Gait Distance (Feet): 22 Feet Assistive device: Rolling walker (2 wheels) Gait Pattern/deviations: Step-through pattern Gait velocity: decr     General Gait Details: increased self ability to advance R LE during gait.  Tolerated amb a functional distance.   Stairs Stairs: Yes Stairs assistance: Supervision Stair Management: No rails, Step to pattern, Forwards, With walker Number of Stairs: 1 General stair comments: one step/curb forward with walker at 25% VC's on proper walker placement and sequencing.   Wheelchair Mobility    Modified Rankin (Stroke Patients Only)       Balance                                            Cognition Arousal/Alertness: Awake/alert Behavior During Therapy: WFL for tasks assessed/performed Overall Cognitive Status: Within Functional Limits  for tasks assessed                                 General Comments: AxO x 3 very pleasant and motivated        Exercises      General Comments        Pertinent Vitals/Pain Pain Assessment Pain Assessment: 0-10 Pain Score: 5  Pain Location: R hip Pain Descriptors / Indicators: Tightness, Operative site guarding, Tender Pain Intervention(s): Monitored during session, Premedicated before session, Repositioned, Ice applied    Home Living                           Prior Function            PT Goals (current goals can now be found in the care plan section) Progress towards PT goals: Progressing toward goals    Frequency    7X/week      PT Plan Current plan remains appropriate    Co-evaluation              AM-PAC PT "6 Clicks" Mobility   Outcome Measure  Help needed turning from your back to your side while in a flat bed without using bedrails?: A Little Help needed moving from lying on your back to sitting on the side of a flat bed without using bedrails?: A Little Help needed moving to and from a bed to a chair (including a wheelchair)?: A Little Help needed standing up from a chair using your arms (e.g., wheelchair or bedside chair)?: A Little Help needed to walk in hospital room?: A Little Help needed climbing 3-5 steps with a railing? : A Little 6 Click Score: 18    End of Session Equipment Utilized During Treatment: Gait belt Activity Tolerance: Patient tolerated treatment well Patient left: in chair;with call bell/phone within reach Nurse Communication: Mobility status PT Visit Diagnosis: Muscle weakness (generalized) (M62.81);Other abnormalities of gait and mobility (R26.89);Difficulty in walking, not elsewhere classified (R26.2) Pain - Right/Left: Right Pain - part of body: Hip     Time: 9983-3825 PT Time Calculation (min) (ACUTE ONLY): 14 min  Charges:  $Gait Training: 8-22 mins                     {Windie Marasco  PTA Acute  Rehabilitation [facility]      [phone] Office      [phone]

## 2021-11-09 NOTE — Plan of Care (Signed)
  Problem: Coping: Goal: Level of anxiety will decrease Outcome: Progressing   Problem: Safety: Goal: Ability to remain free from injury will improve Outcome: Progressing   Problem: Pain Managment: Goal: General experience of comfort will improve Outcome: Progressing   

## 2021-11-14 DIAGNOSIS — Z4789 Encounter for other orthopedic aftercare: Secondary | ICD-10-CM | POA: Diagnosis not present

## 2021-12-12 DIAGNOSIS — Z4789 Encounter for other orthopedic aftercare: Secondary | ICD-10-CM | POA: Diagnosis not present

## 2021-12-15 DIAGNOSIS — I2699 Other pulmonary embolism without acute cor pulmonale: Secondary | ICD-10-CM | POA: Diagnosis not present

## 2021-12-15 DIAGNOSIS — R7301 Impaired fasting glucose: Secondary | ICD-10-CM | POA: Diagnosis not present

## 2021-12-15 DIAGNOSIS — M2550 Pain in unspecified joint: Secondary | ICD-10-CM | POA: Diagnosis not present

## 2021-12-15 DIAGNOSIS — E785 Hyperlipidemia, unspecified: Secondary | ICD-10-CM | POA: Diagnosis not present

## 2021-12-15 DIAGNOSIS — I272 Pulmonary hypertension, unspecified: Secondary | ICD-10-CM | POA: Diagnosis not present

## 2021-12-15 DIAGNOSIS — E278 Other specified disorders of adrenal gland: Secondary | ICD-10-CM | POA: Diagnosis not present

## 2021-12-15 DIAGNOSIS — E669 Obesity, unspecified: Secondary | ICD-10-CM | POA: Diagnosis not present

## 2021-12-15 DIAGNOSIS — D649 Anemia, unspecified: Secondary | ICD-10-CM | POA: Diagnosis not present

## 2021-12-15 DIAGNOSIS — Z96641 Presence of right artificial hip joint: Secondary | ICD-10-CM | POA: Diagnosis not present

## 2022-02-20 DIAGNOSIS — E785 Hyperlipidemia, unspecified: Secondary | ICD-10-CM | POA: Diagnosis not present

## 2022-02-20 DIAGNOSIS — Z0000 Encounter for general adult medical examination without abnormal findings: Secondary | ICD-10-CM | POA: Diagnosis not present

## 2022-02-20 DIAGNOSIS — R739 Hyperglycemia, unspecified: Secondary | ICD-10-CM | POA: Diagnosis not present

## 2022-02-20 DIAGNOSIS — D649 Anemia, unspecified: Secondary | ICD-10-CM | POA: Diagnosis not present

## 2022-02-20 DIAGNOSIS — R5382 Chronic fatigue, unspecified: Secondary | ICD-10-CM | POA: Diagnosis not present

## 2022-02-20 DIAGNOSIS — R7989 Other specified abnormal findings of blood chemistry: Secondary | ICD-10-CM | POA: Diagnosis not present

## 2022-02-27 DIAGNOSIS — R7301 Impaired fasting glucose: Secondary | ICD-10-CM | POA: Diagnosis not present

## 2022-02-27 DIAGNOSIS — I2699 Other pulmonary embolism without acute cor pulmonale: Secondary | ICD-10-CM | POA: Diagnosis not present

## 2022-02-27 DIAGNOSIS — M2550 Pain in unspecified joint: Secondary | ICD-10-CM | POA: Diagnosis not present

## 2022-02-27 DIAGNOSIS — Z0000 Encounter for general adult medical examination without abnormal findings: Secondary | ICD-10-CM | POA: Diagnosis not present

## 2022-02-27 DIAGNOSIS — E785 Hyperlipidemia, unspecified: Secondary | ICD-10-CM | POA: Diagnosis not present

## 2022-02-27 DIAGNOSIS — E669 Obesity, unspecified: Secondary | ICD-10-CM | POA: Diagnosis not present

## 2022-02-27 DIAGNOSIS — R739 Hyperglycemia, unspecified: Secondary | ICD-10-CM | POA: Diagnosis not present

## 2022-02-27 DIAGNOSIS — M5430 Sciatica, unspecified side: Secondary | ICD-10-CM | POA: Diagnosis not present

## 2022-02-27 DIAGNOSIS — R82998 Other abnormal findings in urine: Secondary | ICD-10-CM | POA: Diagnosis not present

## 2022-02-27 DIAGNOSIS — Z96641 Presence of right artificial hip joint: Secondary | ICD-10-CM | POA: Diagnosis not present

## 2022-03-08 ENCOUNTER — Ambulatory Visit (INDEPENDENT_AMBULATORY_CARE_PROVIDER_SITE_OTHER): Payer: Medicare HMO | Admitting: Orthopaedic Surgery

## 2022-03-08 DIAGNOSIS — Z96641 Presence of right artificial hip joint: Secondary | ICD-10-CM

## 2022-03-08 DIAGNOSIS — M1612 Unilateral primary osteoarthritis, left hip: Secondary | ICD-10-CM | POA: Diagnosis not present

## 2022-03-08 NOTE — Progress Notes (Signed)
Office Visit Note   Patient: Monica Martin           Date of Birth: [birth year]           MRN: [id number] Visit Date: 03/08/2022              Requested by: Shon Baton, [address],  [place] [address] PCP: Shon Baton, MD   Assessment & Plan: Visit Diagnoses:  1. Status post total replacement of right hip   2. Primary osteoarthritis of left hip     Plan: In regards to the right hip hematoma we discussed symptomatic treatment and our expectation that this will fully resolve without any problems.  May lead to some permanent skin discoloration.  She is very concerned about under no and left hip replacement because of the complications that she had.  There was also mention that there was a tiny PE that was found and she was on anticoagulation for only a few days.  I think at this point we need further clarification on where she is medically and whether she is optimized to undergo a left hip replacement.  She will need to consult with Dr. Virgina Jock on these questions.  She will call us back when she is ready to look at getting the left hip replaced.  Follow-Up Instructions: No follow-ups on file.   Orders:  No orders of the defined types were placed in this encounter.  No orders of the defined types were placed in this encounter.     Procedures: No procedures performed   Clinical Data: No additional findings.   Subjective: Chief Complaint  Patient presents with   Right Hip - Pain    HPI Monica Martin is a very pleasant 71 year old female who underwent a right total hip replacement in May by another surgeon at the surgery center.  She sustained an intraoperative periprosthetic fracture and then had acute blood loss anemia which necessitated transfer to [facility].  She was there for 13 days.  She also had a thigh hematoma that was treated conservatively.  She also has significant left hip pain due to DJD.  Patient is currently walking with a cane.  Has residual  thigh soreness.  Has questions about ability to do left hip replacement given past surgical history.  Review of Systems  Constitutional: Negative.   HENT: Negative.    Eyes: Negative.   Respiratory: Negative.    Cardiovascular: Negative.   Endocrine: Negative.   Musculoskeletal: Negative.   Neurological: Negative.   Hematological: Negative.   Psychiatric/Behavioral: Negative.    All other systems reviewed and are negative.    Objective: Vital Signs: There were no vitals taken for this visit.  Physical Exam Vitals and nursing note reviewed.  Constitutional:      Appearance: She is well-developed.  HENT:     Head: Atraumatic.     Nose: Nose normal.  Eyes:     Extraocular Movements: Extraocular movements intact.  Cardiovascular:     Pulses: Normal pulses.  Pulmonary:     Effort: Pulmonary effort is normal.  Abdominal:     Palpations: Abdomen is soft.  Musculoskeletal:     Cervical back: Neck supple.  Skin:    General: Skin is warm.     Capillary Refill: Capillary refill takes less than 2 seconds.  Neurological:     Mental Status: She is alert. Mental status is at baseline.  Psychiatric:        Behavior: Behavior normal.  Thought Content: Thought content normal.        Judgment: Judgment normal.     Ortho Exam Lamination of the right hip and thigh shows fully healed surgical scar.'s there is some mild swelling and bruising and some tenderness.  The thigh is overall soft.  Range of motion is well-tolerated.  Examination of the left hip shows pain with internal/external rotation and flexion.  Antalgic gait.  No trochanteric tenderness. Specialty Comments:  No specialty comments available.  Imaging: No results found.   PMFS History: Patient Active Problem List   Diagnosis Date Noted   Primary osteoarthritis of left hip 03/08/2022   Acute blood loss anemia 11/06/2021   Post-operative hemoglobin drop 11/05/2021   Hyperglycemia 11/05/2021   Acute  pulmonary embolus (HCC) 11/03/2021   Adrenal nodule (HCC) 11/02/2021   Hypoalbuminemia 11/02/2021   Postoperative hemorrhagic shock 11/01/2021   AKI (acute kidney injury) (HCC) 11/01/2021   Leukocytosis 11/01/2021   Prediabetes 11/01/2021   Sinus tachycardia 11/01/2021   Obesity (BMI 30-39.9) 11/01/2021   S/P total hip arthroplasty 10/29/2021   ABLA (acute blood loss anemia)    Past Medical History:  Diagnosis Date   Hyperlipidemia    Osteoarthritis    Sciatica     No family history on file.  Past Surgical History:  Procedure Laterality Date   HIP ARTHROPLASTY Right    JOINT REPLACEMENT Right    Social History   Occupational History   Not on file  Tobacco Use   Smoking status: Never   Smokeless tobacco: Never  Substance and Sexual Activity   Alcohol use: No   Drug use: No   Sexual activity: Not on file

## 2022-04-05 DIAGNOSIS — H2513 Age-related nuclear cataract, bilateral: Secondary | ICD-10-CM | POA: Diagnosis not present

## 2022-04-18 DIAGNOSIS — H5203 Hypermetropia, bilateral: Secondary | ICD-10-CM | POA: Diagnosis not present

## 2022-04-18 DIAGNOSIS — H524 Presbyopia: Secondary | ICD-10-CM | POA: Diagnosis not present

## 2022-04-18 DIAGNOSIS — H52209 Unspecified astigmatism, unspecified eye: Secondary | ICD-10-CM | POA: Diagnosis not present

## 2022-04-25 ENCOUNTER — Other Ambulatory Visit: Payer: Self-pay

## 2022-04-25 ENCOUNTER — Telehealth: Payer: Self-pay | Admitting: Orthopaedic Surgery

## 2022-04-25 NOTE — Telephone Encounter (Signed)
Monica Martin was asked to call when ready to schedule left total hip replacement with Dr Roda Shutters.  Please provide surgery sheet if surgery is in order and advise if any clearances will need to be obtained prior to surgery.  Monica Martin would like to have this done before the New Year.  06-06-22 has been offered for the procedure and she has asked we hold this date for her.  Thanks

## 2022-04-26 ENCOUNTER — Ambulatory Visit (INDEPENDENT_AMBULATORY_CARE_PROVIDER_SITE_OTHER): Payer: Medicare HMO | Admitting: Orthopaedic Surgery

## 2022-04-26 ENCOUNTER — Ambulatory Visit (INDEPENDENT_AMBULATORY_CARE_PROVIDER_SITE_OTHER): Payer: Medicare HMO

## 2022-04-26 DIAGNOSIS — M1612 Unilateral primary osteoarthritis, left hip: Secondary | ICD-10-CM

## 2022-05-02 NOTE — Pre-Procedure Instructions (Signed)
Surgical Instructions    Your procedure is scheduled on Wednesday 05/10/22.   Report to [facility] Main Entrance "A" at 07:40 A.M., then check in with the Admitting office.  Call this number if you have problems the morning of surgery:  [phone]   If you have any questions prior to your surgery date call [phone]: Open Monday-Friday 8am-4pm If you experience any cold or flu symptoms such as cough, fever, chills, shortness of breath, etc. between now and your scheduled surgery, please notify us at the above number     Remember:  Do not eat after midnight the night before your surgery  You may drink clear liquids until 07:10 A.M. the morning of your surgery.   Clear liquids allowed are: Water, Non-Citrus Juices (without pulp), Carbonated Beverages, Clear Tea, Black Coffee ONLY (NO MILK, CREAM OR POWDERED CREAMER of any kind), and Gatorade  Patient Instructions  The night before surgery:  No food after midnight. ONLY clear liquids after midnight  The day of surgery (if you do NOT have diabetes):  Drink ONE (1) Pre-Surgery Clear Ensure by 07:10 A.M. the morning of surgery. Drink in one sitting. Do not sip.  This drink was given to you during your hospital  pre-op appointment visit.  Nothing else to drink after completing the  Pre-Surgery Clear Ensure.         If you have questions, please contact your surgeon's office.     Take these medicines the morning of surgery with A SIP OF WATER:   acetaminophen (TYLENOL)- If needed   As of today, STOP taking any Aspirin (unless otherwise instructed by your surgeon) Aleve, Naproxen, Ibuprofen, Motrin, Advil, Goody's, BC's, all herbal medications, fish oil, and all vitamins.           Do not wear jewelry or makeup. Do not wear lotions, powders, perfumes/cologne or deodorant. Do not shave 48 hours prior to surgery.  Men may shave face and neck. Do not bring valuables to the hospital. Do not wear nail polish, gel polish,  artificial nails, or any other type of covering on natural nails (fingers and toes) If you have artificial nails or gel coating that need to be removed by a nail salon, please have this removed prior to surgery. Artificial nails or gel coating may interfere with anesthesia's ability to adequately monitor your vital signs.  [facility] is not responsible for any belongings or valuables.    Do NOT Smoke (Tobacco/Vaping)  24 hours prior to your procedure  If you use a CPAP at night, you may bring your mask for your overnight stay.   Contacts, glasses, hearing aids, dentures or partials may not be worn into surgery, please bring cases for these belongings   For patients admitted to the hospital, discharge time will be determined by your treatment team.   Patients discharged the day of surgery will not be allowed to drive home, and someone needs to stay with them for 24 hours.   SURGICAL WAITING ROOM VISITATION Patients having surgery or a procedure may have no more than 2 support people in the waiting area - these visitors may rotate.   Children under the age of 58 must have an adult with them who is not the patient. If the patient needs to stay at the hospital during part of their recovery, the visitor guidelines for inpatient rooms apply. Pre-op nurse will coordinate an appropriate time for 1 support person to accompany patient in pre-op.  This support person may not  rotate.   Please refer to RuleTracker.hu for the visitor guidelines for Inpatients (after your surgery is over and you are in a regular room).    Special instructions:    Oral Hygiene is also important to reduce your risk of infection.  Remember - BRUSH YOUR TEETH THE MORNING OF SURGERY WITH YOUR REGULAR TOOTHPASTE   [facility]- Preparing For Surgery  Before surgery, you can play an important role. Because skin is not sterile, your skin needs to be as free of germs as  possible. You can reduce the number of germs on your skin by washing with CHG (chlorahexidine gluconate) Soap before surgery.  CHG is an antiseptic cleaner which kills germs and bonds with the skin to continue killing germs even after washing.     Please do not use if you have an allergy to CHG or antibacterial soaps. If your skin becomes reddened/irritated stop using the CHG.  Do not shave (including legs and underarms) for at least 48 hours prior to first CHG shower. It is OK to shave your face.  Please follow these instructions carefully.     Shower the NIGHT BEFORE SURGERY and the MORNING OF SURGERY with CHG Soap.   If you chose to wash your hair, wash your hair first as usual with your normal shampoo. After you shampoo, rinse your hair and body thoroughly to remove the shampoo.  Then [employer] and genitals (private parts) with your normal soap and rinse thoroughly to remove soap.  After that Use CHG Soap as you would any other liquid soap. You can apply CHG directly to the skin and wash gently with a scrungie or a clean washcloth.   Apply the CHG Soap to your body ONLY FROM THE NECK DOWN.  Do not use on open wounds or open sores. Avoid contact with your eyes, ears, mouth and genitals (private parts). Wash Face and genitals (private parts)  with your normal soap.   Wash thoroughly, paying special attention to the area where your surgery will be performed.  Thoroughly rinse your body with warm water from the neck down.  DO NOT shower/wash with your normal soap after using and rinsing off the CHG Soap.  Pat yourself dry with a CLEAN TOWEL.  Wear CLEAN PAJAMAS to bed the night before surgery  Place CLEAN SHEETS on your bed the night before your surgery  DO NOT SLEEP WITH PETS.   Day of Surgery:  Take a shower with CHG soap. Wear Clean/Comfortable clothing the morning of surgery Do not apply any deodorants/lotions.   Remember to brush your teeth WITH YOUR REGULAR  TOOTHPASTE.    If you received a COVID test during your pre-op visit, it is requested that you wear a mask when out in public, stay away from anyone that may not be feeling well, and notify your surgeon if you develop symptoms. If you have been in contact with anyone that has tested positive in the last 10 days, please notify your surgeon.    Please read over the following fact sheets that you were given.

## 2022-05-03 ENCOUNTER — Other Ambulatory Visit: Payer: Self-pay

## 2022-05-03 ENCOUNTER — Encounter (HOSPITAL_COMMUNITY): Payer: Self-pay

## 2022-05-03 ENCOUNTER — Encounter (HOSPITAL_COMMUNITY)
Admission: RE | Admit: 2022-05-03 | Discharge: 2022-05-03 | Disposition: A | Discharge status: Home / Self Care | Payer: Medicare HMO | Source: Ambulatory Visit | Attending: Orthopaedic Surgery | Admitting: Orthopaedic Surgery

## 2022-05-03 VITALS — BP 167/92 | HR 91 | Temp 98.3°F | Resp 17 | Ht 66.5 in | Wt 189.6 lb

## 2022-05-03 DIAGNOSIS — M1612 Unilateral primary osteoarthritis, left hip: Secondary | ICD-10-CM | POA: Insufficient documentation

## 2022-05-03 DIAGNOSIS — Z01812 Encounter for preprocedural laboratory examination: Secondary | ICD-10-CM | POA: Insufficient documentation

## 2022-05-03 DIAGNOSIS — Z01818 Encounter for other preprocedural examination: Secondary | ICD-10-CM

## 2022-05-03 HISTORY — DX: Personal history of other diseases of the digestive system: Z87.19

## 2022-05-03 HISTORY — DX: Prediabetes: R73.03

## 2022-05-03 HISTORY — DX: Pneumonia, unspecified organism: J18.9

## 2022-05-03 LAB — SURGICAL PCR SCREEN
MRSA, PCR: NEGATIVE
Staphylococcus aureus: NEGATIVE

## 2022-05-03 LAB — BASIC METABOLIC PANEL
Anion gap: 10 (ref 5–15)
BUN: 12 mg/dL (ref 8–23)
CO2: 28 mmol/L (ref 22–32)
Calcium: 9.8 mg/dL (ref 8.9–10.3)
Chloride: 105 mmol/L (ref 98–111)
Creatinine, Ser: 0.84 mg/dL (ref 0.44–1.00)
GFR, Estimated: >60 mL/min (ref >60)
Glucose, Bld: 117 mg/dL — ABNORMAL HIGH (ref 70–99)
Potassium: 4.9 mmol/L (ref 3.5–5.1)
Sodium: 143 mmol/L (ref 135–145)

## 2022-05-03 LAB — TYPE AND SCREEN
ABO/RH(D): A POS
Antibody Screen: NEGATIVE

## 2022-05-03 LAB — CBC
HCT: 50.6 % — ABNORMAL HIGH (ref 36.0–46.0)
Hemoglobin: 16.2 g/dL — ABNORMAL HIGH (ref 12.0–15.0)
MCH: 27.9 pg (ref 26.0–34.0)
MCHC: 32 g/dL (ref 30.0–36.0)
MCV: 87.2 fL (ref 80.0–100.0)
Platelets: 455 10*3/uL — ABNORMAL HIGH (ref 150–400)
RBC: 5.8 MIL/uL — ABNORMAL HIGH (ref 3.87–5.11)
RDW: 16.2 % — ABNORMAL HIGH (ref 11.5–15.5)
WBC: 6.3 10*3/uL (ref 4.0–10.5)
nRBC: 0 % (ref 0.0–0.2)

## 2022-05-03 NOTE — Progress Notes (Signed)
PCP - Creola Corn Cardiologist - denies  PPM/ICD - denies   Chest x-ray - n/a EKG - 11/05/21 Stress Test - denies ECHO - 11/02/21 Cardiac Cath - denies  Sleep Study - denies   Follow your surgeon's instructions on when to stop Aspirin.  If no instructions were given by your surgeon then you will need to call the office to get those instructions.     ERAS Protcol -yes PRE-SURGERY Ensure or G2- ensure ordered and given  COVID TEST- not needed   Anesthesia review: no  Patient denies shortness of breath, fever, cough and chest pain at PAT appointment   All instructions explained to the patient, with a verbal understanding of the material. Patient agrees to go over the instructions while at home for a better understanding. Patient also instructed to self quarantine after being tested for COVID-19. The opportunity to ask questions was provided.

## 2022-05-08 ENCOUNTER — Other Ambulatory Visit: Payer: Self-pay | Admitting: Physician Assistant

## 2022-05-08 MED ORDER — ONDANSETRON HCL 4 MG PO TABS: 4.0000 mg | ORAL_TABLET | Freq: Three times a day (TID) | ORAL | 0 refills | Status: DC | PRN

## 2022-05-08 MED ORDER — DOCUSATE SODIUM 100 MG PO CAPS: 100.0000 mg | ORAL_CAPSULE | Freq: Every day | ORAL | 2 refills | Status: DC | PRN

## 2022-05-08 MED ORDER — ASPIRIN 81 MG PO TBEC: 81.0000 mg | DELAYED_RELEASE_TABLET | Freq: Two times a day (BID) | ORAL | 0 refills | Status: DC

## 2022-05-08 MED ORDER — METHOCARBAMOL 750 MG PO TABS: 750.0000 mg | ORAL_TABLET | Freq: Two times a day (BID) | ORAL | 2 refills | Status: DC | PRN

## 2022-05-08 MED ORDER — OXYCODONE-ACETAMINOPHEN 5-325 MG PO TABS: 1.0000 | ORAL_TABLET | Freq: Three times a day (TID) | ORAL | 0 refills | Status: DC | PRN

## 2022-05-09 ENCOUNTER — Telehealth: Payer: Self-pay | Admitting: *Deleted

## 2022-05-09 MED ORDER — TRANEXAMIC ACID 1000 MG/10ML IV SOLN
2000.0000 mg | INTRAVENOUS | Status: DC
  Filled 2022-05-09: qty 20

## 2022-05-09 NOTE — Care Plan (Signed)
RNCM call to patient prior to her upcoming Left total hip replacement. She is an Ortho bundle patient through [facility] and is agreeable to case management. She lives with her spouse, who will be assisting after discharge. She has a 3in1/BSC, RW, shower chair, cane, reacher at home. No other DME needs. Anticipate HHPT will be needed after short hospital stay. Referral made to [facility] after choice provided. Reviewed post op care instructions. Will continue to follow for needs.

## 2022-05-09 NOTE — Telephone Encounter (Signed)
Ortho bundle pre-op call completed. 

## 2022-05-10 ENCOUNTER — Observation Stay (HOSPITAL_COMMUNITY): Payer: Medicare HMO

## 2022-05-10 ENCOUNTER — Encounter (HOSPITAL_COMMUNITY)
Admission: RE | Disposition: A | Discharge status: Home / Self Care | Payer: Self-pay | Source: Home / Self Care | Attending: Orthopaedic Surgery

## 2022-05-10 ENCOUNTER — Observation Stay (HOSPITAL_COMMUNITY)
Admission: RE | Admit: 2022-05-10 | Discharge: 2022-05-11 | Disposition: A | Discharge status: Home / Self Care | Payer: Medicare HMO | Attending: Orthopaedic Surgery | Admitting: Orthopaedic Surgery

## 2022-05-10 ENCOUNTER — Other Ambulatory Visit: Payer: Self-pay | Admitting: Physician Assistant

## 2022-05-10 ENCOUNTER — Ambulatory Visit (HOSPITAL_COMMUNITY): Payer: Medicare HMO

## 2022-05-10 ENCOUNTER — Ambulatory Visit (HOSPITAL_BASED_OUTPATIENT_CLINIC_OR_DEPARTMENT_OTHER): Payer: Medicare HMO | Admitting: Certified Registered Nurse Anesthetist

## 2022-05-10 ENCOUNTER — Other Ambulatory Visit: Payer: Self-pay

## 2022-05-10 ENCOUNTER — Ambulatory Visit (HOSPITAL_COMMUNITY): Payer: Medicare HMO | Admitting: Vascular Surgery

## 2022-05-10 ENCOUNTER — Encounter (HOSPITAL_COMMUNITY): Payer: Self-pay | Admitting: Orthopaedic Surgery

## 2022-05-10 ENCOUNTER — Other Ambulatory Visit (HOSPITAL_COMMUNITY): Payer: Self-pay

## 2022-05-10 DIAGNOSIS — M25452 Effusion, left hip: Secondary | ICD-10-CM

## 2022-05-10 DIAGNOSIS — Z7982 Long term (current) use of aspirin: Secondary | ICD-10-CM | POA: Diagnosis not present

## 2022-05-10 DIAGNOSIS — M1612 Unilateral primary osteoarthritis, left hip: Secondary | ICD-10-CM | POA: Diagnosis not present

## 2022-05-10 DIAGNOSIS — Z96641 Presence of right artificial hip joint: Secondary | ICD-10-CM | POA: Diagnosis not present

## 2022-05-10 DIAGNOSIS — M659 Synovitis and tenosynovitis, unspecified: Secondary | ICD-10-CM | POA: Diagnosis not present

## 2022-05-10 DIAGNOSIS — Z96642 Presence of left artificial hip joint: Secondary | ICD-10-CM

## 2022-05-10 DIAGNOSIS — Z79899 Other long term (current) drug therapy: Secondary | ICD-10-CM | POA: Diagnosis not present

## 2022-05-10 DIAGNOSIS — Z471 Aftercare following joint replacement surgery: Secondary | ICD-10-CM | POA: Diagnosis not present

## 2022-05-10 HISTORY — PX: TOTAL HIP ARTHROPLASTY: SHX124

## 2022-05-10 SURGERY — ARTHROPLASTY, HIP, TOTAL, ANTERIOR APPROACH
Anesthesia: Monitor Anesthesia Care | Site: Hip | Laterality: Left

## 2022-05-10 MED ORDER — OXYCODONE HCL 5 MG PO TABS
5.0000 mg | ORAL_TABLET | ORAL | Status: DC | PRN
  Administered 2022-05-10: 5 mg via ORAL
  Filled 2022-05-10: qty 1

## 2022-05-10 MED ORDER — METHOCARBAMOL 500 MG PO TABS
500.0000 mg | ORAL_TABLET | Freq: Four times a day (QID) | ORAL | Status: DC | PRN
  Administered 2022-05-10: 500 mg via ORAL
  Filled 2022-05-10: qty 1

## 2022-05-10 MED ORDER — PRONTOSAN WOUND IRRIGATION OPTIME
TOPICAL | Status: DC | PRN
  Administered 2022-05-10: 1

## 2022-05-10 MED ORDER — DEXAMETHASONE SODIUM PHOSPHATE 10 MG/ML IJ SOLN
10.0000 mg | Freq: Once | INTRAMUSCULAR | Status: AC
  Administered 2022-05-11: 10 mg via INTRAVENOUS
  Filled 2022-05-10: qty 1

## 2022-05-10 MED ORDER — OXYCODONE HCL 5 MG PO TABS: 5.0000 mg | ORAL_TABLET | Freq: Once | ORAL | Status: DC | PRN

## 2022-05-10 MED ORDER — DIPHENHYDRAMINE HCL 12.5 MG/5ML PO ELIX: 25.0000 mg | ORAL_SOLUTION | ORAL | Status: DC | PRN

## 2022-05-10 MED ORDER — VANCOMYCIN HCL 1000 MG IV SOLR
INTRAVENOUS | Status: AC
  Filled 2022-05-10: qty 20

## 2022-05-10 MED ORDER — PROPOFOL 10 MG/ML IV BOLUS
INTRAVENOUS | Status: DC | PRN
  Administered 2022-05-10: 10 mg via INTRAVENOUS

## 2022-05-10 MED ORDER — ONDANSETRON HCL 4 MG PO TABS: 4.0000 mg | ORAL_TABLET | Freq: Four times a day (QID) | ORAL | Status: DC | PRN

## 2022-05-10 MED ORDER — BUPIVACAINE IN DEXTROSE 0.75-8.25 % IT SOLN
INTRATHECAL | Status: DC | PRN
  Administered 2022-05-10: 2 mL via INTRATHECAL

## 2022-05-10 MED ORDER — ACETAMINOPHEN 500 MG PO TABS
1000.0000 mg | ORAL_TABLET | Freq: Four times a day (QID) | ORAL | Status: AC
  Administered 2022-05-10 – 2022-05-11 (×3): 1000 mg via ORAL
  Filled 2022-05-10 (×3): qty 2

## 2022-05-10 MED ORDER — SORBITOL 70 % SOLN: 30.0000 mL | Freq: Every day | Status: DC | PRN

## 2022-05-10 MED ORDER — SODIUM CHLORIDE 0.9 % IR SOLN
Status: DC | PRN
  Administered 2022-05-10: 1000 mL

## 2022-05-10 MED ORDER — CHLORHEXIDINE GLUCONATE 0.12 % MT SOLN: 15.0000 mL | Freq: Once | OROMUCOSAL | Status: AC

## 2022-05-10 MED ORDER — ONDANSETRON HCL 4 MG/2ML IJ SOLN
INTRAMUSCULAR | Status: DC | PRN
  Administered 2022-05-10: 4 mg via INTRAVENOUS

## 2022-05-10 MED ORDER — ORAL CARE MOUTH RINSE: 15.0000 mL | Freq: Once | OROMUCOSAL | Status: AC

## 2022-05-10 MED ORDER — METOCLOPRAMIDE HCL 5 MG/ML IJ SOLN: 5.0000 mg | Freq: Three times a day (TID) | INTRAMUSCULAR | Status: DC | PRN

## 2022-05-10 MED ORDER — SODIUM CHLORIDE 0.9 % IV SOLN: INTRAVENOUS | Status: DC

## 2022-05-10 MED ORDER — PROPOFOL 500 MG/50ML IV EMUL
INTRAVENOUS | Status: DC | PRN
  Administered 2022-05-10: 100 ug/kg/min via INTRAVENOUS

## 2022-05-10 MED ORDER — BUPIVACAINE-MELOXICAM ER 400-12 MG/14ML IJ SOLN
INTRAMUSCULAR | Status: AC
  Filled 2022-05-10: qty 1

## 2022-05-10 MED ORDER — CHLORHEXIDINE GLUCONATE 0.12 % MT SOLN
OROMUCOSAL | Status: AC
  Administered 2022-05-10: 15 mL via OROMUCOSAL
  Filled 2022-05-10: qty 15

## 2022-05-10 MED ORDER — RIVAROXABAN 10 MG PO TABS: 10.0000 mg | ORAL_TABLET | Freq: Every day | ORAL | 0 refills | Status: DC

## 2022-05-10 MED ORDER — POLYETHYLENE GLYCOL 3350 17 G PO PACK
17.0000 g | PACK | Freq: Every day | ORAL | Status: DC
  Administered 2022-05-11: 17 g via ORAL
  Filled 2022-05-10: qty 1

## 2022-05-10 MED ORDER — PROMETHAZINE HCL 25 MG/ML IJ SOLN: 6.2500 mg | INTRAMUSCULAR | Status: DC | PRN

## 2022-05-10 MED ORDER — RIVAROXABAN 10 MG PO TABS
10.0000 mg | ORAL_TABLET | Freq: Every day | ORAL | Status: DC
  Administered 2022-05-11: 10 mg via ORAL
  Filled 2022-05-10: qty 1

## 2022-05-10 MED ORDER — METOCLOPRAMIDE HCL 5 MG PO TABS: 5.0000 mg | ORAL_TABLET | Freq: Three times a day (TID) | ORAL | Status: DC | PRN

## 2022-05-10 MED ORDER — FERROUS SULFATE 325 (65 FE) MG PO TABS: 325.0000 mg | ORAL_TABLET | Freq: Three times a day (TID) | ORAL | Status: DC

## 2022-05-10 MED ORDER — 0.9 % SODIUM CHLORIDE (POUR BTL) OPTIME
TOPICAL | Status: DC | PRN
  Administered 2022-05-10: 1000 mL

## 2022-05-10 MED ORDER — BUPIVACAINE-MELOXICAM ER 400-12 MG/14ML IJ SOLN
INTRAMUSCULAR | Status: DC | PRN
  Administered 2022-05-10: 400 mg

## 2022-05-10 MED ORDER — VANCOMYCIN HCL 1 G IV SOLR
INTRAVENOUS | Status: DC | PRN
  Administered 2022-05-10: 1000 mg via TOPICAL

## 2022-05-10 MED ORDER — ONDANSETRON HCL 4 MG/2ML IJ SOLN
INTRAMUSCULAR | Status: AC
  Filled 2022-05-10: qty 2

## 2022-05-10 MED ORDER — MIDAZOLAM HCL 2 MG/2ML IJ SOLN
INTRAMUSCULAR | Status: DC | PRN
  Administered 2022-05-10: 2 mg via INTRAVENOUS

## 2022-05-10 MED ORDER — PHENOL 1.4 % MT LIQD: 1.0000 | OROMUCOSAL | Status: DC | PRN

## 2022-05-10 MED ORDER — HYDROMORPHONE HCL 1 MG/ML IJ SOLN
INTRAMUSCULAR | Status: AC
  Filled 2022-05-10: qty 1

## 2022-05-10 MED ORDER — CEFAZOLIN SODIUM-DEXTROSE 2-4 GM/100ML-% IV SOLN
2.0000 g | INTRAVENOUS | Status: AC
Start: 2022-05-10 — End: 2022-05-10
  Administered 2022-05-10: 2 g via INTRAVENOUS

## 2022-05-10 MED ORDER — ONDANSETRON HCL 4 MG/2ML IJ SOLN: 4.0000 mg | Freq: Four times a day (QID) | INTRAMUSCULAR | Status: DC | PRN

## 2022-05-10 MED ORDER — ALUM & MAG HYDROXIDE-SIMETH 200-200-20 MG/5ML PO SUSP: 30.0000 mL | ORAL | Status: DC | PRN

## 2022-05-10 MED ORDER — TRANEXAMIC ACID-NACL 1000-0.7 MG/100ML-% IV SOLN
INTRAVENOUS | Status: AC
  Filled 2022-05-10: qty 100

## 2022-05-10 MED ORDER — METHOCARBAMOL 1000 MG/10ML IJ SOLN: 500.0000 mg | Freq: Four times a day (QID) | INTRAVENOUS | Status: DC | PRN

## 2022-05-10 MED ORDER — ACETAMINOPHEN 325 MG PO TABS: 325.0000 mg | ORAL_TABLET | Freq: Four times a day (QID) | ORAL | Status: DC | PRN

## 2022-05-10 MED ORDER — DOCUSATE SODIUM 100 MG PO CAPS
100.0000 mg | ORAL_CAPSULE | Freq: Two times a day (BID) | ORAL | Status: DC
  Administered 2022-05-10 – 2022-05-11 (×2): 100 mg via ORAL
  Filled 2022-05-10 (×2): qty 1

## 2022-05-10 MED ORDER — OXYCODONE HCL ER 10 MG PO T12A
10.0000 mg | EXTENDED_RELEASE_TABLET | Freq: Two times a day (BID) | ORAL | Status: DC
  Administered 2022-05-10 – 2022-05-11 (×3): 10 mg via ORAL
  Filled 2022-05-10 (×3): qty 1

## 2022-05-10 MED ORDER — HYDROMORPHONE HCL 1 MG/ML IJ SOLN: 0.5000 mg | INTRAMUSCULAR | Status: DC | PRN

## 2022-05-10 MED ORDER — CEFAZOLIN SODIUM-DEXTROSE 2-4 GM/100ML-% IV SOLN
2.0000 g | Freq: Four times a day (QID) | INTRAVENOUS | Status: AC
  Administered 2022-05-10 (×2): 2 g via INTRAVENOUS
  Filled 2022-05-10 (×2): qty 100

## 2022-05-10 MED ORDER — PHENYLEPHRINE HCL-NACL 20-0.9 MG/250ML-% IV SOLN
INTRAVENOUS | Status: DC | PRN
  Administered 2022-05-10: 40 ug/min via INTRAVENOUS

## 2022-05-10 MED ORDER — TRANEXAMIC ACID-NACL 1000-0.7 MG/100ML-% IV SOLN
1000.0000 mg | Freq: Once | INTRAVENOUS | Status: AC
  Administered 2022-05-10: 1000 mg via INTRAVENOUS
  Filled 2022-05-10: qty 100

## 2022-05-10 MED ORDER — CEFAZOLIN SODIUM-DEXTROSE 2-4 GM/100ML-% IV SOLN
INTRAVENOUS | Status: AC
  Filled 2022-05-10: qty 100

## 2022-05-10 MED ORDER — HYDROMORPHONE HCL 1 MG/ML IJ SOLN
0.2500 mg | INTRAMUSCULAR | Status: DC | PRN
  Administered 2022-05-10 (×4): 0.25 mg via INTRAVENOUS

## 2022-05-10 MED ORDER — POVIDONE-IODINE 10 % EX SWAB: 2.0000 | Freq: Once | CUTANEOUS | Status: DC

## 2022-05-10 MED ORDER — LACTATED RINGERS IV SOLN: INTRAVENOUS | Status: DC

## 2022-05-10 MED ORDER — MIDAZOLAM HCL 2 MG/2ML IJ SOLN
INTRAMUSCULAR | Status: AC
  Filled 2022-05-10: qty 2

## 2022-05-10 MED ORDER — MENTHOL 3 MG MT LOZG: 1.0000 | LOZENGE | OROMUCOSAL | Status: DC | PRN

## 2022-05-10 MED ORDER — OXYCODONE HCL 5 MG PO TABS: 10.0000 mg | ORAL_TABLET | ORAL | Status: DC | PRN

## 2022-05-10 MED ORDER — PANTOPRAZOLE SODIUM 40 MG PO TBEC
40.0000 mg | DELAYED_RELEASE_TABLET | Freq: Every day | ORAL | Status: DC
  Administered 2022-05-11: 40 mg via ORAL
  Filled 2022-05-10: qty 1

## 2022-05-10 MED ORDER — OXYCODONE HCL 5 MG/5ML PO SOLN: 5.0000 mg | Freq: Once | ORAL | Status: DC | PRN

## 2022-05-10 MED ORDER — MAGNESIUM CITRATE PO SOLN: 1.0000 | Freq: Once | ORAL | Status: DC | PRN

## 2022-05-10 MED ORDER — TRANEXAMIC ACID-NACL 1000-0.7 MG/100ML-% IV SOLN
1000.0000 mg | INTRAVENOUS | Status: AC
  Administered 2022-05-10: 1000 mg via INTRAVENOUS

## 2022-05-10 MED ORDER — TRANEXAMIC ACID 1000 MG/10ML IV SOLN
INTRAVENOUS | Status: DC | PRN
  Administered 2022-05-10: 2000 mg via TOPICAL

## 2022-05-10 MED ORDER — FERROUS SULFATE 325 (65 FE) MG PO TABS
325.0000 mg | ORAL_TABLET | Freq: Every day | ORAL | Status: DC
  Administered 2022-05-11: 325 mg via ORAL
  Filled 2022-05-10: qty 1

## 2022-05-10 SURGICAL SUPPLY — 73 items
BAG COUNTER SPONGE SURGICOUNT (BAG) ×1 IMPLANT
BAG DECANTER FOR FLEXI CONT (MISCELLANEOUS) ×1 IMPLANT
BAG SPNG CNTER NS LX DISP (BAG) ×1
BALL HIP CERAMIC (Hips) IMPLANT
BLADE SAW SAG 90X13X1.27 (BLADE) IMPLANT
CELLS DAT CNTRL 66122 CELL SVR (MISCELLANEOUS) IMPLANT
COOLER ICEMAN CLASSIC (MISCELLANEOUS) IMPLANT
COVER PERINEAL POST (MISCELLANEOUS) ×1 IMPLANT
COVER SURGICAL LIGHT HANDLE (MISCELLANEOUS) ×1 IMPLANT
CUP SECTOR GRIPTON 50MM (Cup) IMPLANT
DRAPE C-ARM 42X72 X-RAY (DRAPES) ×1 IMPLANT
DRAPE POUCH INSTRU U-SHP 10X18 (DRAPES) ×1 IMPLANT
DRAPE STERI IOBAN 125X83 (DRAPES) ×1 IMPLANT
DRAPE U-SHAPE 47X51 STRL (DRAPES) ×2 IMPLANT
DRSG AQUACEL AG ADV 3.5X10 (GAUZE/BANDAGES/DRESSINGS) ×1 IMPLANT
DRSG MEPILEX POST OP 4X8 (GAUZE/BANDAGES/DRESSINGS) IMPLANT
DURAPREP 26ML APPLICATOR (WOUND CARE) ×2 IMPLANT
ELECT BLADE 4.0 EZ CLEAN MEGAD (MISCELLANEOUS) ×1
ELECT REM PT RETURN 9FT ADLT (ELECTROSURGICAL) ×1
ELECTRODE BLDE 4.0 EZ CLN MEGD (MISCELLANEOUS) ×1 IMPLANT
ELECTRODE REM PT RTRN 9FT ADLT (ELECTROSURGICAL) ×1 IMPLANT
GAUZE XEROFORM 1X8 LF (GAUZE/BANDAGES/DRESSINGS) IMPLANT
GLOVE BIOGEL PI IND STRL 7.0 (GLOVE) ×2 IMPLANT
GLOVE BIOGEL PI IND STRL 7.5 (GLOVE) ×5 IMPLANT
GLOVE ECLIPSE 7.0 STRL STRAW (GLOVE) ×2 IMPLANT
GLOVE SKINSENSE STRL SZ7.5 (GLOVE) ×1 IMPLANT
GLOVE SURG SYN 7.5  E (GLOVE) ×2
GLOVE SURG SYN 7.5 E (GLOVE) ×2 IMPLANT
GLOVE SURG SYN 7.5 PF PI (GLOVE) ×2 IMPLANT
GLOVE SURG UNDER POLY LF SZ7 (GLOVE) ×1 IMPLANT
GLOVE SURG UNDER POLY LF SZ7.5 (GLOVE) ×2 IMPLANT
GOWN STRL REIN XL XLG (GOWN DISPOSABLE) ×1 IMPLANT
GOWN STRL REUS W/ TWL LRG LVL3 (GOWN DISPOSABLE) IMPLANT
GOWN STRL REUS W/ TWL XL LVL3 (GOWN DISPOSABLE) ×1 IMPLANT
GOWN STRL REUS W/TWL LRG LVL3 (GOWN DISPOSABLE)
GOWN STRL REUS W/TWL XL LVL3 (GOWN DISPOSABLE) ×1
HANDPIECE INTERPULSE COAX TIP (DISPOSABLE) ×1
HIP BALL CERAMIC (Hips) ×1 IMPLANT
HOOD PEEL AWAY FLYTE STAYCOOL (MISCELLANEOUS) ×2 IMPLANT
IV NS IRRIG 3000ML ARTHROMATIC (IV SOLUTION) ×1 IMPLANT
JET LAVAGE IRRISEPT WOUND (IRRIGATION / IRRIGATOR)
KIT BASIN OR (CUSTOM PROCEDURE TRAY) ×1 IMPLANT
LAVAGE JET IRRISEPT WOUND (IRRIGATION / IRRIGATOR) ×1 IMPLANT
LINER ACET PNNCL PLUS4 NEUTRAL (Hips) IMPLANT
MARKER SKIN DUAL TIP RULER LAB (MISCELLANEOUS) ×1 IMPLANT
NDL SPNL 18GX3.5 QUINCKE PK (NEEDLE) ×1 IMPLANT
NEEDLE SPNL 18GX3.5 QUINCKE PK (NEEDLE) ×1 IMPLANT
PACK TOTAL JOINT (CUSTOM PROCEDURE TRAY) ×1 IMPLANT
PACK UNIVERSAL I (CUSTOM PROCEDURE TRAY) ×1 IMPLANT
PAD COLD SHLDR WRAP-ON (PAD) IMPLANT
PINNACLE PLUS 4 NEUTRAL (Hips) ×1 IMPLANT
RETRACTOR WND ALEXIS 18 MED (MISCELLANEOUS) IMPLANT
RTRCTR WOUND ALEXIS 18CM MED (MISCELLANEOUS)
SAW OSC TIP CART 19.5X105X1.3 (SAW) ×1 IMPLANT
SCREW 6.5MMX25MM (Screw) IMPLANT
SET HNDPC FAN SPRY TIP SCT (DISPOSABLE) ×1 IMPLANT
SOLUTION PRONTOSAN WOUND 350ML (IRRIGATION / IRRIGATOR) IMPLANT
STAPLER VISISTAT 35W (STAPLE) IMPLANT
STEM FEM ACTIS STD SZ2 (Stem) IMPLANT
SUT ETHIBOND 2 V 37 (SUTURE) ×1 IMPLANT
SUT ETHILON 2 0 FS 18 (SUTURE) IMPLANT
SUT VIC AB 0 CT1 27 (SUTURE) ×1
SUT VIC AB 0 CT1 27XBRD ANBCTR (SUTURE) ×1 IMPLANT
SUT VIC AB 1 CTX 36 (SUTURE) ×1
SUT VIC AB 1 CTX36XBRD ANBCTR (SUTURE) ×1 IMPLANT
SUT VIC AB 2-0 CT1 27 (SUTURE) ×2
SUT VIC AB 2-0 CT1 TAPERPNT 27 (SUTURE) ×2 IMPLANT
SYR 50ML LL SCALE MARK (SYRINGE) ×1 IMPLANT
TOWEL GREEN STERILE (TOWEL DISPOSABLE) ×1 IMPLANT
TRAY CATH 16FR W/PLASTIC CATH (SET/KITS/TRAYS/PACK) IMPLANT
TRAY FOLEY W/BAG SLVR 16FR (SET/KITS/TRAYS/PACK) ×1
TRAY FOLEY W/BAG SLVR 16FR ST (SET/KITS/TRAYS/PACK) ×1 IMPLANT
YANKAUER SUCT BULB TIP NO VENT (SUCTIONS) ×1 IMPLANT

## 2022-05-10 NOTE — Discharge Instructions (Addendum)
Information on my medicine - ELIQUIS (apixaban)  This medication education was reviewed with me or my healthcare representative as part of my discharge preparation.     Why was Eliquis prescribed for you? Eliquis was prescribed for you to reduce the risk of blood clots forming after orthopedic surgery.    What do You need to know about Eliquis? Take your Eliquis TWICE DAILY - one tablet in the morning and one tablet in the evening with or without food.  It would be best to take the dose about the same time each day.  If you have difficulty swallowing the tablet whole please discuss with your pharmacist how to take the medication safely.  Take Eliquis exactly as prescribed by your doctor and DO NOT stop taking Eliquis without talking to the doctor who prescribed the medication.  Stopping without other medication to take the place of Eliquis may increase your risk of developing a clot.  After discharge, you should have regular check-up appointments with your healthcare provider that is prescribing your Eliquis.  What do you do if you miss a dose? If a dose of ELIQUIS is not taken at the scheduled time, take it as soon as possible on the same day and twice-daily administration should be resumed.  The dose should not be doubled to make up for a missed dose.  Do not take more than one tablet of ELIQUIS at the same time.  Important Safety Information A possible side effect of Eliquis is bleeding. You should call your healthcare provider right away if you experience any of the following: Bleeding from an injury or your nose that does not stop. Unusual colored urine (red or dark brown) or unusual colored stools (red or black). Unusual bruising for unknown reasons. A serious fall or if you hit your head (even if there is no bleeding).  Some medicines may interact with Eliquis and might increase your risk of bleeding or clotting while on Eliquis. To help avoid this, consult your  healthcare provider or pharmacist prior to using any new prescription or non-prescription medications, including herbals, vitamins, non-steroidal anti-inflammatory drugs (NSAIDs) and supplements.  This website has more information on Eliquis (apixaban): http://www.eliquis.com/eliquis/home

## 2022-05-10 NOTE — Evaluation (Signed)
Physical Therapy Evaluation Patient Details Name: Monica Martin MRN: [id number] DOB: [birth year] Today's Date: 05/10/2022  History of Present Illness  Pt is 71 yo female s/p L anterior THA 05/10/22.  Pt with hx of HLD, OA, sciatica, reports R THA in 4/23 with complications of post op bleeding , PE, and 13 day stay  Clinical Impression  Pt is s/p THA resulting in the deficits listed below (see PT Problem List). At baseline, pt using a cane for community ambulation and no AD since her R THA in April.  She has assistance or uses DME for lower body dressing.  Pt seen for evaluation on DOS and required min A transfers and stood at bedside. Pt with increased pain standing and became nauseated having to return to sitting.  Pt with episode of emesis -reports has not ate much and has had pain meds.  Pt felt better after emesis and return to supine.  Expected to progress as pain, N/V improve. Pt will benefit from skilled PT to increase their independence and safety with mobility to allow discharge to the venue listed below.         Recommendations for follow up therapy are one component of a multi-disciplinary discharge planning process, led by the attending physician.  Recommendations may be updated based on patient status, additional functional criteria and insurance authorization.  Follow Up Recommendations Follow physician's recommendations for discharge plan and follow up therapies      Assistance Recommended at Discharge Frequent or constant Supervision/Assistance  Patient can return home with the following  A lot of help with walking and/or transfers;A lot of help with bathing/dressing/bathroom;Help with stairs or ramp for entrance    Equipment Recommendations None recommended by PT  Recommendations for Other Services       Functional Status Assessment Patient has had a recent decline in their functional status and demonstrates the ability to make significant improvements in function in a  reasonable and predictable amount of time.     Precautions / Restrictions Precautions Precautions: Fall Restrictions Weight Bearing Restrictions: Yes LLE Weight Bearing: Weight bearing as tolerated      Mobility  Bed Mobility Overal bed mobility: Needs Assistance Bed Mobility: Supine to Sit, Sit to Supine     Supine to sit: Min assist Sit to supine: Min assist   General bed mobility comments: min A L LE    Transfers Overall transfer level: Needs assistance Equipment used: Rolling walker (2 wheels) Transfers: Sit to/from Stand Sit to Stand: Min assist           General transfer comment: min A with cues for hand placement    Ambulation/Gait               General Gait Details: unable to advance to gait due to pain and nausea/vomitting  Stairs            Wheelchair Mobility    Modified Rankin (Stroke Patients Only)       Balance Overall balance assessment: Needs assistance Sitting-balance support: No upper extremity supported Sitting balance-Leahy Scale: Good     Standing balance support: Bilateral upper extremity supported Standing balance-Leahy Scale: Poor Standing balance comment: requiring RW and min A                             Pertinent Vitals/Pain Pain Assessment Pain Assessment: 0-10 Pain Score: 3  Pain Location: L hip Pain Descriptors / Indicators: Discomfort  Home Living Family/patient expects to be discharged to:: Private residence Living Arrangements: Spouse/significant other Available Help at Discharge: Family;Available 24 hours/day Type of Home: House Home Access: Stairs to enter Entrance Stairs-Rails: Right Entrance Stairs-Number of Steps: 1 curb   Home Layout: One level Home Equipment: Agricultural consultant (2 wheels);Tub bench;BSC/3in1;Adaptive equipment      Prior Function Prior Level of Function : Needs assist             Mobility Comments: Cane to ambulate in community ; no AD in home ADLs  Comments: Has difficulty with lower body dressing - uses DME or family assist; Since R THA using tub bench     Hand Dominance   Dominant Hand: Right    Extremity/Trunk Assessment   Upper Extremity Assessment Upper Extremity Assessment: Overall WFL for tasks assessed    Lower Extremity Assessment Lower Extremity Assessment: LLE deficits/detail;RLE deficits/detail RLE Deficits / Details: ROM WFL; MMT 5/5 knee ext and dorsiflexion RLE Sensation: WNL LLE Deficits / Details: expected post op changes; ROM WFL; MMT ankle 5/5, knee ext 1/5, hip 1/5 LLE Sensation: WNL    Cervical / Trunk Assessment Cervical / Trunk Assessment: Normal  Communication   Communication: No difficulties  Cognition Arousal/Alertness: Awake/alert Behavior During Therapy: WFL for tasks assessed/performed Overall Cognitive Status: Within Functional Limits for tasks assessed                                          General Comments General comments (skin integrity, edema, etc.): VSS but with N/V notified RN    Exercises     Assessment/Plan    PT Assessment Patient needs continued PT services  PT Problem List Decreased strength;Pain;Decreased range of motion;Decreased activity tolerance;Decreased knowledge of use of DME;Decreased balance;Decreased mobility;Decreased knowledge of precautions       PT Treatment Interventions DME instruction;Therapeutic exercise;Balance training;Gait training;Stair training;Functional mobility training;Therapeutic activities;Patient/family education    PT Goals (Current goals can be found in the Care Plan section)  Acute Rehab PT Goals Patient Stated Goal: return home PT Goal Formulation: With patient Time For Goal Achievement: 05/24/22 Potential to Achieve Goals: Good    Frequency 7X/week     Co-evaluation               AM-PAC PT "6 Clicks" Mobility  Outcome Measure Help needed turning from your back to your side while in a flat bed  without using bedrails?: A Little Help needed moving from lying on your back to sitting on the side of a flat bed without using bedrails?: A Little Help needed moving to and from a bed to a chair (including a wheelchair)?: A Lot Help needed standing up from a chair using your arms (e.g., wheelchair or bedside chair)?: A Little Help needed to walk in hospital room?: Total Help needed climbing 3-5 steps with a railing? : Total 6 Click Score: 13    End of Session Equipment Utilized During Treatment: Gait belt Activity Tolerance: Patient limited by pain Patient left: in bed;with call bell/phone within reach;with bed alarm set;with family/visitor present Nurse Communication: Mobility status PT Visit Diagnosis: Other abnormalities of gait and mobility (R26.89);Muscle weakness (generalized) (M62.81)    Time: 9935-7017 PT Time Calculation (min) (ACUTE ONLY): 28 min   Charges:   PT Evaluation $PT Eval Low Complexity: 1 Low PT Treatments $Therapeutic Activity: 8-22 mins        Basilia Stuckert, PT  Acute Rehab [facility] [phone]   Rayetta Humphrey 05/10/2022, 4:11 PM

## 2022-05-10 NOTE — Transfer of Care (Signed)
Immediate Anesthesia Transfer of Care Note  Patient: Monica Martin  Procedure(s) Performed: LEFT TOTAL HIP ARTHROPLASTY ANTERIOR APPROACH (Left: Hip)  Patient Location: PACU  Anesthesia Type:MAC combined with regional for post-op pain  Level of Consciousness: sedated  Airway & Oxygen Therapy: Patient Spontanous Breathing and Patient connected to face mask oxygen  Post-op Assessment: Report given to RN and Post -op Vital signs reviewed and stable  Post vital signs: Reviewed and stable  Last Vitals:  Vitals Value Taken Time  BP 112/66 05/10/22 1202  Temp 98   Pulse 77 05/10/22 1204  Resp 11 05/10/22 1204  SpO2 100 % 05/10/22 1204  Vitals shown include unvalidated device data.  Last Pain:  Vitals:   05/10/22 0820  TempSrc: Oral         Complications: No notable events documented.

## 2022-05-10 NOTE — H&P (Signed)
PREOPERATIVE H&P  Chief Complaint: left hip osteoarthritis  HPI: Monica Martin is a 71 y.o. female who presents for surgical treatment of left hip osteoarthritis.  She denies any changes in medical history.  Past Medical History:  Diagnosis Date   History of hiatal hernia    Hyperlipidemia    Osteoarthritis    Pneumonia    Pre-diabetes    Sciatica    Past Surgical History:  Procedure Laterality Date   BREAST BIOPSY Bilateral    multiple biopsies age 80-30   CESAREAN SECTION  08/08/1979   HIP ARTHROPLASTY Right    JOINT REPLACEMENT Right    Social History   Socioeconomic History   Marital status: Married    Spouse name: Not on file   Number of children: Not on file   Years of education: Not on file   Highest education level: Not on file  Occupational History   Not on file  Tobacco Use   Smoking status: Never   Smokeless tobacco: Never  Vaping Use   Vaping Use: Never used  Substance and Sexual Activity   Alcohol use: No   Drug use: No   Sexual activity: Not on file  Other Topics Concern   Not on file  Social History Narrative   Not on file   Social Determinants of Health   Financial Resource Strain: Not on file  Food Insecurity: Not on file  Transportation Needs: Not on file  Physical Activity: Not on file  Stress: Not on file  Social Connections: Not on file   History reviewed. No pertinent family history. No Known Allergies Prior to Admission medications   Medication Sig Start Date End Date Taking? Authorizing Provider  acetaminophen (TYLENOL) 500 MG tablet Take 1,000 mg by mouth every 4 (four) hours as needed for moderate pain.   Yes [provider]  aspirin EC 81 MG tablet Take 1 tablet (81 mg total) by mouth 2 (two) times daily. To be taken after surgery to prevent blood clots 05/08/22 05/08/23 Yes Cristie Hem, PA-C  docusate sodium (COLACE) 100 MG capsule Take 1 capsule (100 mg total) by mouth daily as needed. 05/08/22 05/08/23  Yes Cristie Hem, PA-C  methocarbamol (ROBAXIN-750) 750 MG tablet Take 1 tablet (750 mg total) by mouth 2 (two) times daily as needed for muscle spasms. To be taken after surgery 05/08/22  Yes Jari Sportsman L, PA-C  ondansetron (ZOFRAN) 4 MG tablet Take 1 tablet (4 mg total) by mouth every 8 (eight) hours as needed for nausea or vomiting. 05/08/22  Yes Cristie Hem, PA-C  oxyCODONE-acetaminophen (PERCOCET) 5-325 MG tablet Take 1-2 tablets by mouth 3 (three) times daily as needed. To be taken after surgery 05/08/22  Yes Cristie Hem, PA-C     Positive ROS: All other systems have been reviewed and were otherwise negative with the exception of those mentioned in the HPI and as above.  Physical Exam: General: Alert, no acute distress Cardiovascular: No pedal edema Respiratory: No cyanosis, no use of accessory musculature GI: abdomen soft Skin: No lesions in the area of chief complaint Neurologic: Sensation intact distally Psychiatric: Patient is competent for consent with normal mood and affect Lymphatic: no lymphedema  MUSCULOSKELETAL: exam stable  Assessment: left hip osteoarthritis  Plan: Plan for Procedure(s): LEFT TOTAL HIP ARTHROPLASTY ANTERIOR APPROACH  The risks benefits and alternatives were discussed with the patient including but not limited to the risks of nonoperative treatment, versus surgical intervention including infection, bleeding,  nerve injury,  blood clots, cardiopulmonary complications, morbidity, mortality, among others, and they were willing to proceed.   Glee Arvin, MD 05/10/2022 9:04 AM

## 2022-05-10 NOTE — Op Note (Signed)
LEFT TOTAL HIP ARTHROPLASTY ANTERIOR APPROACH  Procedure Note Monica Martin   [id number]  Pre-op Diagnosis: left hip osteoarthritis     Post-op Diagnosis: same  Operative Findings Severe wear of femoral head and acetabulum Joint effusion, synovitis Leg length discrepancy   Operative Procedures  1. Total hip replacement; Left hip; uncemented [id number]   Surgeon: Gershon Mussel, M.D.  Assist: Oneal Grout, PA-C   Anesthesia: spinal  Prosthesis: Depuy Acetabulum: Pinnacle 50 mm Femur: Actis 2 STD Head: 32 mm size: +5 Liner: +4 Bearing Type: ceramic/poly  Total Hip Arthroplasty (Anterior Approach) Op Note:  After informed consent was obtained and the operative extremity marked in the holding area, the patient was brought back to the operating room and placed supine on the HANA table. Next, the operative extremity was prepped and draped in normal sterile fashion. Surgical timeout occurred verifying patient identification, surgical site, surgical procedure and administration of antibiotics.  A Hueter approach to the hip was performed, using the interval between tensor fascia lata and sartorius.  Dissection was carried bluntly down onto the anterior hip capsule. The lateral femoral circumflex vessels were identified and coagulated. A capsulotomy was performed and the capsular flaps tagged for later repair.  The neck osteotomy was performed. The femoral head was removed which showed severe wear, the acetabular rim was cleared of soft tissue and attention was turned to reaming the acetabulum.  Sequential reaming was performed under fluoroscopic guidance. We reamed to a size 49 mm, and then impacted the acetabular shell. A 25 mm cancellous screw was placed through the shell for added fixation.  The liner was then placed after irrigation and attention turned to the femur.  After placing the femoral hook, the leg was taken to externally rotated, extended and adducted position taking care  to perform soft tissue releases to allow for adequate mobilization of the femur. Soft tissue was cleared from the shoulder of the greater trochanter and the hook elevator used to improve exposure of the proximal femur. Sequential broaching performed up to a size 2. Trial neck and head were placed. The leg was brought back up to neutral and the construct reduced.  Antibiotic irrigation was placed in the surgical wound.  The position and sizing of components, offset and leg lengths were checked using fluoroscopy and clinically.  There was no shucking of the leg. Stability of the construct was checked in extension and external rotation without any subluxation or impingement of prosthesis. We dislocated the prosthesis, dropped the leg back into position, removed trial components, and irrigated copiously. The final stem and head was then placed, the leg brought back up, the system reduced and fluoroscopy used to verify positioning.  We irrigated, obtained hemostasis and closed the capsule using #2 ethibond suture.  One gram of vancomycin powder was placed in the surgical bed.   One gram of topical tranexamic acid was injected into the joint.  The fascia was closed with #1 vicryl plus, the deep fat layer was closed with 0 vicryl, the subcutaneous layers closed with 2.0 Vicryl Plus and the skin closed with 2.0 nylon. A sterile dressing was applied. The patient was awakened in the operating room and taken to recovery in stable condition.  All sponge, needle, and instrument counts were correct at the end of the case.   Tessa Lerner, my PA, was a medical necessity for opening, closing, limb positioning, retracting, exposing, and overall facilitation and timely completion of the surgery.  Position: supine  Complications: see description  of procedure.  Time Out: performed   Drains/Packing: none  Estimated blood loss: see anesthesia record  Returned to Recovery Room: in good condition.   Antibiotics: yes    Mechanical VTE (DVT) Prophylaxis: sequential compression devices, TED thigh-high  Chemical VTE (DVT) Prophylaxis: xarelto POD 1   Fluid Replacement: see anesthesia record  Specimens Removed: 1 to pathology   Sponge and Instrument Count Correct? yes   PACU: portable radiograph - low AP   Plan/RTC: Return in 2 weeks for staple removal. Weight Bearing/Load Lower Extremity: full  Hip precautions: none Suture Removal: 2 weeks   N. Glee Arvin, MD [facility] 11:29 AM   Implant Name Type Inv. Item Serial No. Manufacturer Lot No. LRB No. Used Action  CUP SECTOR GRIPTON - [id number] Cup CUP SECTOR GRIPTON  [employer] [id number] Left 1 Implanted  PINNACLE PLUS 4 NEUTRAL - [id number] Hips PINNACLE PLUS 4 NEUTRAL  [employer] [id number] Left 1 Implanted  SCREW 6.5MMX25MM - [id number] Screw SCREW 6.5MMX25MM  [employer] [id number] Left 1 Implanted  STEM FEM ACTIS STD SZ2 - [id number] Stem STEM FEM ACTIS STD SZ2  [employer] [id number] Left 1 Implanted  HIP BALL CERAMIC - [id number] Hips HIP BALL CERAMIC  [employer] [id number] Left 1 Implanted

## 2022-05-10 NOTE — Anesthesia Preprocedure Evaluation (Signed)
Anesthesia Evaluation  Patient identified by MRN, date of birth, ID band Patient awake    Reviewed: Allergy & Precautions, H&P , NPO status , Patient's Chart, lab work & pertinent test results  Airway Mallampati: II  TM Distance: >3 FB Neck ROM: Full    Dental no notable dental hx.    Pulmonary neg pulmonary ROS   Pulmonary exam normal breath sounds clear to auscultation       Cardiovascular negative cardio ROS Normal cardiovascular exam Rhythm:Regular Rate:Normal     Neuro/Psych negative neurological ROS  negative psych ROS   GI/Hepatic negative GI ROS, Neg liver ROS,,,  Endo/Other  negative endocrine ROS    Renal/GU negative Renal ROS  negative genitourinary   Musculoskeletal  (+) Arthritis , Osteoarthritis,    Abdominal  (+) + obese  Peds negative pediatric ROS (+)  Hematology negative hematology ROS (+)   Anesthesia Other Findings   Reproductive/Obstetrics negative OB ROS                             Anesthesia Physical Anesthesia Plan  ASA: 2  Anesthesia Plan: MAC and Regional   Post-op Pain Management: Minimal or no pain anticipated   Induction: Intravenous  PONV Risk Score and Plan: 2 and Ondansetron, Midazolam and Treatment may vary due to age or medical condition  Airway Management Planned: Simple Face Mask  Additional Equipment:   Intra-op Plan:   Post-operative Plan:   Informed Consent: I have reviewed the patients History and Physical, chart, labs and discussed the procedure including the risks, benefits and alternatives for the proposed anesthesia with the patient or authorized representative who has indicated his/her understanding and acceptance.     Dental advisory given  Plan Discussed with: CRNA  Anesthesia Plan Comments:        Anesthesia Quick Evaluation

## 2022-05-10 NOTE — Anesthesia Procedure Notes (Signed)
Spinal  Patient location during procedure: OR Start time: 05/10/2022 10:13 AM End time: 05/10/2022 10:18 AM Reason for block: surgical anesthesia Staffing Performed: anesthesiologist  Anesthesiologist: Lowella Curb, MD Performed by: Lowella Curb, MD Authorized by: Lowella Curb, MD   Preanesthetic Checklist Completed: patient identified, IV checked, site marked, risks and benefits discussed, surgical consent, monitors and equipment checked, pre-op evaluation and timeout performed Spinal Block Patient position: sitting Prep: DuraPrep Patient monitoring: heart rate, cardiac monitor, continuous pulse ox and blood pressure Approach: midline Location: L3-4 Injection technique: single-shot Needle Needle type: Quincke  Needle gauge: 22 G Needle length: 9 cm Assessment Sensory level: T4 Events: CSF return

## 2022-05-10 NOTE — Care Management (Signed)
Bundled patient. Note from office RN CM: She has a 3in1/BSC, RW, shower chair, cane, reacher at home. No other DME needs. Anticipate HHPT will be needed after short hospital stay. Referral made to [facility] after choice provided.

## 2022-05-10 NOTE — Anesthesia Postprocedure Evaluation (Signed)
Anesthesia Post Note  Patient: Monica Martin  Procedure(s) Performed: LEFT TOTAL HIP ARTHROPLASTY ANTERIOR APPROACH (Left: Hip)     Patient location during evaluation: PACU Anesthesia Type: Regional and Spinal Level of consciousness: awake and alert Pain management: pain level controlled Vital Signs Assessment: post-procedure vital signs reviewed and stable Respiratory status: spontaneous breathing, nonlabored ventilation and respiratory function stable Cardiovascular status: blood pressure returned to baseline and stable Postop Assessment: no apparent nausea or vomiting Anesthetic complications: no   No notable events documented.  Last Vitals:  Vitals:   05/10/22 1300 05/10/22 1315  BP: 135/69 (!) 147/73  Pulse: 69 77  Resp: 10 14  Temp:    SpO2: 98% 99%    Last Pain:  Vitals:   05/10/22 1300  TempSrc:   PainSc: 4                  Lowella Curb

## 2022-05-11 ENCOUNTER — Encounter (HOSPITAL_COMMUNITY): Payer: Self-pay | Admitting: Orthopaedic Surgery

## 2022-05-11 DIAGNOSIS — Z96641 Presence of right artificial hip joint: Secondary | ICD-10-CM | POA: Diagnosis not present

## 2022-05-11 DIAGNOSIS — Z7982 Long term (current) use of aspirin: Secondary | ICD-10-CM | POA: Diagnosis not present

## 2022-05-11 DIAGNOSIS — M1612 Unilateral primary osteoarthritis, left hip: Secondary | ICD-10-CM | POA: Diagnosis not present

## 2022-05-11 DIAGNOSIS — Z79899 Other long term (current) drug therapy: Secondary | ICD-10-CM | POA: Diagnosis not present

## 2022-05-11 LAB — CBC
HCT: 43 % (ref 36.0–46.0)
Hemoglobin: 13.4 g/dL (ref 12.0–15.0)
MCH: 27.3 pg (ref 26.0–34.0)
MCHC: 31.2 g/dL (ref 30.0–36.0)
MCV: 87.6 fL (ref 80.0–100.0)
Platelets: 378 10*3/uL (ref 150–400)
RBC: 4.91 MIL/uL (ref 3.87–5.11)
RDW: 16.1 % — ABNORMAL HIGH (ref 11.5–15.5)
WBC: 8.4 10*3/uL (ref 4.0–10.5)
nRBC: 0 % (ref 0.0–0.2)

## 2022-05-11 MED ORDER — APIXABAN 5 MG PO TABS: ORAL_TABLET | ORAL | Status: DC

## 2022-05-11 NOTE — Progress Notes (Signed)
Explained discharge instructions to patient. Reviewed follow up appointment and next medication administration times. Also reviewed education. Patient verbalized having an understanding for instructions given. All belongings are in the patient's possession to include TOC meds. IV was removed by staff prior to my explaining the discharge instructions. Clarified with Fleet Contras patient's nurse that the patient is discharging on Eliquis instead of Xarelto as mentioned in the PA's note. Fleet Contras confirmed as well as the patient that she was instructed to take eliquis because she has the med at home already and can't afford to pay for a new prescription of Xarelto. No other needs verbalized. Transported downstairs for discharge.

## 2022-05-11 NOTE — Progress Notes (Addendum)
Subjective: 1 Day Post-Op Procedure(s) (LRB): LEFT TOTAL HIP ARTHROPLASTY ANTERIOR APPROACH (Left) Patient reports pain as mild.  Experienced some nausea/vomiting overnight, but feeling better this am.  No other complaints.  Objective: Vital signs in last 24 hours: Temp:  [97.7 F (36.5 C)-98.8 F (37.1 C)] 98 F (36.7 C) (11/30 0500) Pulse Rate:  [64-109] 90 (11/30 0500) Resp:  [10-18] 17 (11/30 0500) BP: (93-166)/(54-86) 128/62 (11/30 0500) SpO2:  [96 %-100 %] 99 % (11/30 0500) Weight:  [85.7 kg-86.4 kg] 86.4 kg (11/29 1434)  Intake/Output from previous day: 11/29 0701 - 11/30 0700 In: 987.7 [I.V.:987.7] Out: 1100 [Urine:1050; Blood:50] Intake/Output this shift: No intake/output data recorded.  Recent Labs    05/11/22 0521  HGB 13.4   Recent Labs    05/11/22 0521  WBC 8.4  RBC 4.91  HCT 43.0  PLT 378   No results for input(s): "NA", "K", "CL", "CO2", "BUN", "CREATININE", "GLUCOSE", "CALCIUM" in the last 72 hours. No results for input(s): "LABPT", "INR" in the last 72 hours.  Neurologically intact Neurovascular intact Sensation intact distally Intact pulses distally Dorsiflexion/Plantar flexion intact Incision: dressing C/D/I No cellulitis present Compartment soft   Assessment/Plan: 1 Day Post-Op Procedure(s) (LRB): LEFT TOTAL HIP ARTHROPLASTY ANTERIOR APPROACH (Left) Advance diet Up with therapy D/C IV fluids WBAT LLE Post-op dvt ppx switched from asa to xarelto.  New rx has been called in and patient is aware.   D/c after second PT session as long as cleared by PT     Cristie Hem 05/11/2022, 7:43 AM

## 2022-05-11 NOTE — Plan of Care (Signed)

## 2022-05-11 NOTE — Progress Notes (Signed)
Physical Therapy Treatment Patient Details Name: Monica Martin MRN: [id number] DOB: 04-18-1951 Today's Date: 05/11/2022   History of Present Illness Pt is 71 yo female s/p L anterior THA 05/10/22.  Pt with hx of HLD, OA, sciatica, reports R THA in 4/23 with complications of post-op bleeding, PE, and 13 day stay    PT Comments    Pt received seated EOB, eager to DC and spouse arriving to room to assist with transportation, pt agreeable to therapy session to review HEP handout, safety with transfers/RW use, short gait instruction in room and stair sequencing. Pt receptive to all instruction, needing only min guard for transfers and gait at most with RW and minA for simulated step in the room. Pt and spouse report no concerns and she reports familiarity with HEP program from previous surgery. Encouraged use of iceman throughout day as much as possible for pain/edema relief. Pt continues to benefit from PT services to progress toward functional mobility goals.Pt defers second PT session as she feels ready to DC in AM and no longer with sx of nausea/malaise and mobilizing with minimal pain only.    Recommendations for follow up therapy are one component of a multi-disciplinary discharge planning process, led by the attending physician.  Recommendations may be updated based on patient status, additional functional criteria and insurance authorization.  Follow Up Recommendations  Follow physician's recommendations for discharge plan and follow up therapies     Assistance Recommended at Discharge Frequent or constant Supervision/Assistance  Patient can return home with the following Help with stairs or ramp for entrance;A little help with walking and/or transfers;A little help with bathing/dressing/bathroom   Equipment Recommendations  None recommended by PT    Recommendations for Other Services       Precautions / Restrictions Precautions Precautions: Fall Restrictions Weight Bearing  Restrictions: Yes LLE Weight Bearing: Weight bearing as tolerated     Mobility  Bed Mobility Overal bed mobility: Needs Assistance             General bed mobility comments: pt performed prior to PTA arriving to room and reports no difficulty    Transfers Overall transfer level: Needs assistance Equipment used: Rolling walker (2 wheels) Transfers: Sit to/from Stand Sit to Stand: Min guard           General transfer comment: cues for foot/hand placement for less pain, pt receptive; min guard for safety    Ambulation/Gait Ambulation/Gait assistance: Min guard Gait Distance (Feet): 15 Feet Assistive device: Rolling walker (2 wheels) Gait Pattern/deviations: Step-to pattern, Step-through pattern, Decreased stride length, Antalgic       General Gait Details: good RW management, distance limited as pt eager to DC and reports she had ambulated in room already without significant difficulty   Stairs Stairs: Yes Stairs assistance: Min assist Stair Management: One rail Left, Step to pattern, Forwards Number of Stairs:  (partial 1 step) General stair comments: foot tap on platform in room, defer full step as platform was ~8" and pt reports her step at home is just a curb ~3", pt was able to bring good RLE up onto platform so anticipate she will not have difficult, pt/spouse able to verbalize proper sequencing with RW and "up with good/down with sore leg"   Wheelchair Mobility    Modified Rankin (Stroke Patients Only)       Balance Overall balance assessment: Mild deficits observed, not formally tested         Standing balance support: Bilateral upper extremity supported  Standing balance comment: reliant on RW and min guard to Supervision                            Cognition Arousal/Alertness: Awake/alert Behavior During Therapy: WFL for tasks assessed/performed Overall Cognitive Status: Within Functional Limits for tasks assessed                                  General Comments: A&O, spouse arriving to room at beginning of session and receptive to instruction.        Exercises Other Exercises Other Exercises: seated LLE AROM: ankle pumps, LAQ x5 reps Other Exercises: verbal/visual demo for standing hip flexion/abd (only to perform with second person who can assist her using gait belt or during therapy session) also handout given to reinforce supine HEP - SAQ, hip abd (pain-free ROMAT), heel slides, quad sets pt receptive Other Exercises: Encouraged use of IS hourly (pt states she has at home) given previous sx with post-op PE complication    General Comments General comments (skin integrity, edema, etc.): VSS per chart review, pt denies nausea/dizziness or other acute s/sx distress and ate her breakfast well in AM      Pertinent Vitals/Pain Pain Assessment Pain Assessment: 0-10 Pain Score: 2  Pain Location: L hip Pain Descriptors / Indicators: Discomfort Pain Intervention(s): Limited activity within patient's tolerance, Monitored during session, Premedicated before session, Repositioned     PT Goals (current goals can now be found in the care plan section) Acute Rehab PT Goals Patient Stated Goal: return home PT Goal Formulation: With patient Time For Goal Achievement: 05/24/22 Progress towards PT goals: Progressing toward goals    Frequency    7X/week      PT Plan Current plan remains appropriate       AM-PAC PT "6 Clicks" Mobility   Outcome Measure  Help needed turning from your back to your side while in a flat bed without using bedrails?: None Help needed moving from lying on your back to sitting on the side of a flat bed without using bedrails?: A Little Help needed moving to and from a bed to a chair (including a wheelchair)?: A Little Help needed standing up from a chair using your arms (e.g., wheelchair or bedside chair)?: A Little Help needed to walk in hospital room?: A Little Help  needed climbing 3-5 steps with a railing? : A Lot 6 Click Score: 18    End of Session Equipment Utilized During Treatment: Gait belt Activity Tolerance: Patient tolerated treatment well;Patient limited by fatigue Patient left: in chair;with call bell/phone within reach;with family/visitor present (sitting EOB, dressed with coat on eager to DC, spouse present) Nurse Communication: Mobility status;Other (comment) (pt eager to review DC paperwork and to leave) PT Visit Diagnosis: Other abnormalities of gait and mobility (R26.89);Muscle weakness (generalized) (M62.81)     Time: 0867-6195 PT Time Calculation (min) (ACUTE ONLY): 16 min  Charges:  $Therapeutic Activity: 8-22 mins                     Zhane Bluitt P., PTA Acute Rehabilitation Services Secure Chat Preferred 9a-5:30pm Office: [phone]    Dorathy Kinsman [facility] 05/11/2022, 11:52 AM

## 2022-05-11 NOTE — Discharge Summary (Signed)
Patient ID: Monica Martin MRN: [id number] DOB/AGE: 1951/03/26 71 y.o.  Admit date: 05/10/2022 Discharge date: 05/11/2022  Admission Diagnoses:  Principal Problem:   Primary osteoarthritis of left hip Active Problems:   Status post total replacement of left hip   Discharge Diagnoses:  Same  Past Medical History:  Diagnosis Date   History of hiatal hernia    Hyperlipidemia    Osteoarthritis    Pneumonia    Pre-diabetes    Sciatica     Surgeries: Procedure(s): LEFT TOTAL HIP ARTHROPLASTY ANTERIOR APPROACH on 05/10/2022   Consultants:   Discharged Condition: Improved  Hospital Course: Monica Martin is an 71 y.o. female who was admitted 05/10/2022 for operative treatment ofPrimary osteoarthritis of left hip. Patient has severe unremitting pain that affects sleep, daily activities, and work/hobbies. After pre-op clearance the patient was taken to the operating room on 05/10/2022 and underwent  Procedure(s): LEFT TOTAL HIP ARTHROPLASTY ANTERIOR APPROACH.    Patient was given perioperative antibiotics:  Anti-infectives (From admission, onward)    Start     Dose/Rate Route Frequency Ordered Stop   05/10/22 1600  ceFAZolin (ANCEF) IVPB 2g/100 mL premix        2 g 200 mL/hr over 30 Minutes Intravenous Every 6 hours 05/10/22 1421 05/10/22 2154   05/10/22 1048  vancomycin (VANCOCIN) powder  Status:  Discontinued          As needed 05/10/22 1048 05/10/22 1159   05/10/22 0815  ceFAZolin (ANCEF) IVPB 2g/100 mL premix        2 g 200 mL/hr over 30 Minutes Intravenous On call to O.R. 05/10/22 0809 05/10/22 1014   05/10/22 0813  ceFAZolin (ANCEF) 2-4 GM/100ML-% IVPB       Note to Pharmacy: Payton Emerald A: cabinet override      05/10/22 0813 05/10/22 1014        Patient was given sequential compression devices, early ambulation, and chemoprophylaxis to prevent DVT.  Patient benefited maximally from hospital stay and there were no complications.    Recent vital signs: Patient  Vitals for the past 24 hrs:  BP Temp Temp src Pulse Resp SpO2 Height Weight  05/11/22 0500 128/62 98 F (36.7 C) Oral 90 17 99 % -- --  05/10/22 2049 138/75 98.5 F (36.9 C) Oral (!) 106 17 97 % -- --  05/10/22 1630 (!) 155/79 97.7 F (36.5 C) Oral (!) 109 16 99 % -- --  05/10/22 1434 (!) 133/57 97.7 F (36.5 C) Oral 94 -- 98 % 5' 6.5" (1.689 m) 86.4 kg  05/10/22 1415 133/65 -- -- 77 10 97 % -- --  05/10/22 1400 (!) 124/59 -- -- 73 17 98 % -- --  05/10/22 1345 135/76 -- -- 81 16 96 % -- --  05/10/22 1330 (!) 166/77 -- -- 86 17 96 % -- --  05/10/22 1315 (!) 147/73 -- -- 77 14 99 % -- --  05/10/22 1300 135/69 -- -- 69 10 98 % -- --  05/10/22 1245 (!) 149/71 -- -- 64 18 100 % -- --  05/10/22 1230 (!) 105/54 -- -- 67 13 100 % -- --  05/10/22 1215 (!) 93/56 -- -- 80 16 100 % -- --  05/10/22 1203 112/66 98.2 F (36.8 C) -- 85 15 100 % -- --  05/10/22 0820 (!) 165/86 98.8 F (37.1 C) Oral 96 18 100 % 5' 6.5" (1.689 m) 85.7 kg     Recent laboratory studies:  Recent Labs  05/11/22 0521  WBC 8.4  HGB 13.4  HCT 43.0  PLT 378     Discharge Medications:   Allergies as of 05/11/2022   No Known Allergies      Medication List     STOP taking these medications    acetaminophen 500 MG tablet Commonly known as: TYLENOL       TAKE these medications    docusate sodium 100 MG capsule Commonly known as: Colace Take 1 capsule (100 mg total) by mouth daily as needed.   methocarbamol 750 MG tablet Commonly known as: Robaxin-750 Take 1 tablet (750 mg total) by mouth 2 (two) times daily as needed for muscle spasms. To be taken after surgery   ondansetron 4 MG tablet Commonly known as: Zofran Take 1 tablet (4 mg total) by mouth every 8 (eight) hours as needed for nausea or vomiting.   oxyCODONE-acetaminophen 5-325 MG tablet Commonly known as: Percocet Take 1-2 tablets by mouth 3 (three) times daily as needed. To be taken after surgery   rivaroxaban 10 MG Tabs  tablet Commonly known as: XARELTO Take 1 tablet (10 mg total) by mouth daily. To be taken after surgery (INSTEAD of aspirin 81 mg) to prevent blood clots               Durable Medical Equipment  (From admission, onward)           Start     Ordered   05/10/22 1422  DME Walker rolling  Once       Question:  Patient needs a walker to treat with the following condition  Answer:  History of hip replacement   05/10/22 1421   05/10/22 1422  DME 3 n 1  Once        05/10/22 1421   05/10/22 1422  DME Bedside commode  Once       Question:  Patient needs a bedside commode to treat with the following condition  Answer:  History of hip replacement   05/10/22 1421            Diagnostic Studies: DG Pelvis Portable  Result Date: 05/10/2022 CLINICAL DATA:  Status post left hip arthroplasty. EXAM: PORTABLE PELVIS 1-2 VIEWS COMPARISON:  Preoperative radiograph 04/26/2022 FINDINGS: Left hip arthroplasty in expected alignment. No periprosthetic lucency or fracture. Recent postsurgical change includes air and edema in the soft tissues. Previous right hip arthroplasty is in place. IMPRESSION: Left hip arthroplasty without immediate postoperative complication. Electronically Signed   By: Narda Rutherford M.D.   On: 05/10/2022 12:28   DG HIP UNILAT WITH PELVIS 1V LEFT  Result Date: 05/10/2022 CLINICAL DATA:  [id number] Elective surgery [id number] left total hip arthroplasty EXAM: DG HIP (WITH OR WITHOUT PELVIS) 1V*L* COMPARISON:  04/26/2022 FINDINGS: Intraoperative images during left total hip arthroplasty. Intact hardware without evidence of loosening or fracture. Normal alignment. IMPRESSION: Intraoperative images during left total hip arthroplasty. No evidence of immediate hardware complication. Electronically Signed   By: Caprice Renshaw M.D.   On: 05/10/2022 11:41   DG C-Arm 1-60 Min-No Report  Result Date: 05/10/2022 Fluoroscopy was utilized by the requesting physician.  No radiographic  interpretation.   DG C-Arm 1-60 Min-No Report  Result Date: 05/10/2022 Fluoroscopy was utilized by the requesting physician.  No radiographic interpretation.   XR HIP UNILAT W OR W/O PELVIS 2-3 VIEWS LEFT  Result Date: 04/26/2022 2 views x-rays of the left hip show severe osteoarthritis with bone-on-bone joint space narrowing.  Stable right total hip replacement.  Disposition: Discharge disposition: 01-Home or Self Care          Follow-up Information     Tarry Kos, MD. Schedule an appointment as soon as possible for a visit in 2 week(s).   Specialty: Orthopedic Surgery Contact information: [address] [place] [address] [phone]                  Signed: Cristie Hem 05/11/2022, 7:45 AM

## 2022-05-18 ENCOUNTER — Telehealth: Payer: Self-pay | Admitting: *Deleted

## 2022-05-18 NOTE — Telephone Encounter (Signed)
Ortho bundle 7 day call completed. 

## 2022-05-24 ENCOUNTER — Ambulatory Visit (INDEPENDENT_AMBULATORY_CARE_PROVIDER_SITE_OTHER): Payer: Medicare HMO | Admitting: Orthopaedic Surgery

## 2022-05-24 DIAGNOSIS — Z96642 Presence of left artificial hip joint: Secondary | ICD-10-CM

## 2022-05-24 NOTE — Progress Notes (Signed)
   Post-Op Visit Note   Patient: Monica Martin           Date of Birth: 1950/06/18           MRN: [id number] Visit Date: 05/24/2022 PCP: Creola Corn, MD   Assessment & Plan:  Chief Complaint:  Chief Complaint  Patient presents with   Left Hip - Routine Post Op   Visit Diagnoses:  1. Status post total replacement of left hip     Plan: Monica Martin is 2 weeks status post left total hip replacement on 05/10/2022.  She is using a cane and overall feels great.  Only takes occasional pain medications.  The incision is clean, dry, and intact and healing very well. There is no drainage, erythema, or signs of infection. Motion is progressing nicely.    We have encouraged continued use of TED hose for DVT prophylaxis and we will stop Eliquis and start baby aspirin twice a day for the next 4 weeks.  Patient is progressing well. Reminders were given about signs to be aware of including redness, drainage, increased pain, fevers, calf pain, shortness of breath, or any concern should generate a phone call or a return to see [place] immediately. Will plan to follow up at 6 weeks post op for next evaluation with radiographs at that time including low AP pelvis, AP and lateral radiographs.   Follow-Up Instructions: Return in about 4 weeks (around 06/21/2022).   Orders:  No orders of the defined types were placed in this encounter.  No orders of the defined types were placed in this encounter.   Imaging: No results found.  PMFS History: Patient Active Problem List   Diagnosis Date Noted   Status post total replacement of left hip 05/10/2022   Primary osteoarthritis of left hip 03/08/2022   Acute blood loss anemia 11/06/2021   Post-operative hemoglobin drop 11/05/2021   Hyperglycemia 11/05/2021   Acute pulmonary embolus (HCC) 11/03/2021   Adrenal nodule (HCC) 11/02/2021   Hypoalbuminemia 11/02/2021   Postoperative hemorrhagic shock 11/01/2021   AKI (acute kidney injury) (HCC) 11/01/2021    Leukocytosis 11/01/2021   Prediabetes 11/01/2021   Sinus tachycardia 11/01/2021   Obesity (BMI 30-39.9) 11/01/2021   S/P total hip arthroplasty 10/29/2021   ABLA (acute blood loss anemia)    Past Medical History:  Diagnosis Date   History of hiatal hernia    Hyperlipidemia    Osteoarthritis    Pneumonia    Pre-diabetes    Sciatica     No family history on file.  Past Surgical History:  Procedure Laterality Date   BREAST BIOPSY Bilateral    multiple biopsies age 40-30   CESAREAN SECTION  08/08/1979   HIP ARTHROPLASTY Right    JOINT REPLACEMENT Right    TOTAL HIP ARTHROPLASTY Left 05/10/2022   Procedure: LEFT TOTAL HIP ARTHROPLASTY ANTERIOR APPROACH;  Surgeon: Tarry Kos, MD;  Location: [facility];  Service: Orthopedics;  Laterality: Left;  3-C   Social History   Occupational History   Not on file  Tobacco Use   Smoking status: Never   Smokeless tobacco: Never  Vaping Use   Vaping Use: Never used  Substance and Sexual Activity   Alcohol use: No   Drug use: No   Sexual activity: Not on file

## 2022-06-08 ENCOUNTER — Telehealth: Payer: Self-pay | Admitting: *Deleted

## 2022-06-08 NOTE — Telephone Encounter (Signed)
(  Late entry for 05/24/22) Ortho bundle call- Met with patient in office; unable to document due to computer issue. Doing well overall and having no new needs. Will continue to follow for CM needs.

## 2022-06-09 ENCOUNTER — Telehealth: Payer: Self-pay | Admitting: *Deleted

## 2022-06-09 NOTE — Telephone Encounter (Signed)
Ortho bundle 30 day call completed. °

## 2022-06-21 ENCOUNTER — Encounter: Payer: Self-pay | Admitting: Orthopaedic Surgery

## 2022-06-21 ENCOUNTER — Ambulatory Visit (INDEPENDENT_AMBULATORY_CARE_PROVIDER_SITE_OTHER): Payer: Medicare HMO

## 2022-06-21 ENCOUNTER — Ambulatory Visit (INDEPENDENT_AMBULATORY_CARE_PROVIDER_SITE_OTHER): Payer: Medicare HMO | Admitting: Physician Assistant

## 2022-06-21 DIAGNOSIS — Z96642 Presence of left artificial hip joint: Secondary | ICD-10-CM

## 2022-06-21 NOTE — Progress Notes (Signed)
Well-seated prosthesis without prompt  Post-Op Visit Note   Patient: Monica Martin           Date of Birth: 28-Jan-1951           MRN: [id number] Visit Date: 06/21/2022 PCP: Shon Baton, MD   Assessment & Plan:  Chief Complaint:  Chief Complaint  Patient presents with   Left Hip - Follow-up    Left total hip arthroplasty 05/10/2022   Visit Diagnoses:  1. Status post total replacement of left hip     Plan: Patient is a pleasant 72 year old female who comes in today 6 weeks status post left total hip replacement 05/10/2022.  She has been doing well.  Minimal discomfort.  She is taking muscle relaxer and Tylenol as needed.  She finished her Eliquis 4 weeks postop and has been taking a baby aspirin once daily instead of twice daily for the past few weeks as she notes taking 2 a day causes constipation.  Examination of her left hip reveals painless logroll and hip flexion.  She is neurovascular intact distally.  At this point, she will continue with a home exercise program.  Dental prophylaxis reinforced.  Follow-up in 6 weeks for repeat evaluation.  Call with concerns or questions.  Follow-Up Instructions: No follow-ups on file.   Orders:  Orders Placed This Encounter  Procedures   XR HIP UNILAT W OR W/O PELVIS 2-3 VIEWS LEFT   No orders of the defined types were placed in this encounter.   Imaging: No results found.  PMFS History: Patient Active Problem List   Diagnosis Date Noted   Status post total replacement of left hip 05/10/2022   Primary osteoarthritis of left hip 03/08/2022   Acute blood loss anemia 11/06/2021   Post-operative hemoglobin drop 11/05/2021   Hyperglycemia 11/05/2021   Acute pulmonary embolus ([place]) 11/03/2021   Adrenal nodule ([place]) 11/02/2021   Hypoalbuminemia 11/02/2021   Postoperative hemorrhagic shock 11/01/2021   AKI (acute kidney injury) ([place]) 11/01/2021   Leukocytosis 11/01/2021   Prediabetes 11/01/2021   Sinus tachycardia 11/01/2021    Obesity (BMI 30-39.9) 11/01/2021   S/P total hip arthroplasty 10/29/2021   ABLA (acute blood loss anemia)    Past Medical History:  Diagnosis Date   History of hiatal hernia    Hyperlipidemia    Osteoarthritis    Pneumonia    Pre-diabetes    Sciatica     No family history on file.  Past Surgical History:  Procedure Laterality Date   BREAST BIOPSY Bilateral    multiple biopsies age 66-30   CESAREAN SECTION  08/08/1979   HIP ARTHROPLASTY Right    JOINT REPLACEMENT Right    TOTAL HIP ARTHROPLASTY Left 05/10/2022   Procedure: LEFT TOTAL HIP ARTHROPLASTY ANTERIOR APPROACH;  Surgeon: Leandrew Koyanagi, MD;  Location: [facility];  Service: Orthopedics;  Laterality: Left;  3-C   Social History   Occupational History   Not on file  Tobacco Use   Smoking status: Never   Smokeless tobacco: Never  Vaping Use   Vaping Use: Never used  Substance and Sexual Activity   Alcohol use: No   Drug use: No   Sexual activity: Not on file

## 2022-08-02 ENCOUNTER — Encounter: Payer: Self-pay | Admitting: Orthopaedic Surgery

## 2022-08-02 ENCOUNTER — Ambulatory Visit: Payer: Medicare HMO | Admitting: Orthopaedic Surgery

## 2022-08-02 DIAGNOSIS — M545 Low back pain, unspecified: Secondary | ICD-10-CM | POA: Diagnosis not present

## 2022-08-02 DIAGNOSIS — M25551 Pain in right hip: Secondary | ICD-10-CM

## 2022-08-02 DIAGNOSIS — Z96642 Presence of left artificial hip joint: Secondary | ICD-10-CM | POA: Diagnosis not present

## 2022-08-02 NOTE — Progress Notes (Signed)
   Post-Op Visit Note   Patient: Monica Martin           Date of Birth: [birth year]           MRN: [id number] Visit Date: 08/02/2022 PCP: Shon Baton, MD   Assessment & Plan:  Chief Complaint:  Chief Complaint  Patient presents with   Left Hip - Follow-up    Left total hip arthroplasty 05/10/2022   Visit Diagnoses:  1. Status post total replacement of left hip   2. Pain in right hip     Plan: Impression is low back pain and lumbar radiculopathy.  This has not improved since her hip replacements dating back to 2023.  At this point we will order MRI to rule out structural abnormalities.  I will message the patient after the MRI.  HPI: Monica Martin is a 72 year old female who is here for follow-up of left hip replacement but also evaluation of a separate problem of right buttock pain and low back pain.  She is doing great in regards to the left hip and has no complaints.  She continues to have pain in the buttock that radiates down into the thigh and leg with numbness and tingling.  Examination of the left hip shows fully healed surgical scar.  She has fluid painless range of motion. Examination of the right lower extremity shows fully healed surgical scar.  She has good range of motion of the hip without pain.  She reports pain from her buttock down into the right leg with passive stretch.  Follow-Up Instructions: No follow-ups on file.   Orders:  No orders of the defined types were placed in this encounter.  No orders of the defined types were placed in this encounter.   Imaging: No results found.  PMFS History: Patient Active Problem List   Diagnosis Date Noted   Status post total replacement of left hip 05/10/2022   Primary osteoarthritis of left hip 03/08/2022   Acute blood loss anemia 11/06/2021   Post-operative hemoglobin drop 11/05/2021   Hyperglycemia 11/05/2021   Acute pulmonary embolus ([place]) 11/03/2021   Adrenal nodule ([place]) 11/02/2021   Hypoalbuminemia 11/02/2021    Postoperative hemorrhagic shock 11/01/2021   AKI (acute kidney injury) ([place]) 11/01/2021   Leukocytosis 11/01/2021   Prediabetes 11/01/2021   Sinus tachycardia 11/01/2021   Obesity (BMI 30-39.9) 11/01/2021   S/P total hip arthroplasty 10/29/2021   ABLA (acute blood loss anemia)    Past Medical History:  Diagnosis Date   History of hiatal hernia    Hyperlipidemia    Osteoarthritis    Pneumonia    Pre-diabetes    Sciatica     No family history on file.  Past Surgical History:  Procedure Laterality Date   BREAST BIOPSY Bilateral    multiple biopsies age 75-30   CESAREAN SECTION  08/08/1979   HIP ARTHROPLASTY Right    JOINT REPLACEMENT Right    TOTAL HIP ARTHROPLASTY Left 05/10/2022   Procedure: LEFT TOTAL HIP ARTHROPLASTY ANTERIOR APPROACH;  Surgeon: Leandrew Koyanagi, MD;  Location: [facility];  Service: Orthopedics;  Laterality: Left;  3-C   Social History   Occupational History   Not on file  Tobacco Use   Smoking status: Never   Smokeless tobacco: Never  Vaping Use   Vaping Use: Never used  Substance and Sexual Activity   Alcohol use: No   Drug use: No   Sexual activity: Not on file

## 2022-08-04 ENCOUNTER — Other Ambulatory Visit: Payer: Self-pay | Admitting: Internal Medicine

## 2022-08-04 DIAGNOSIS — Z1231 Encounter for screening mammogram for malignant neoplasm of breast: Secondary | ICD-10-CM

## 2022-08-08 ENCOUNTER — Encounter: Payer: Self-pay | Admitting: Orthopaedic Surgery

## 2022-08-09 ENCOUNTER — Telehealth: Payer: Self-pay | Admitting: Orthopaedic Surgery

## 2022-08-09 ENCOUNTER — Other Ambulatory Visit: Payer: Self-pay

## 2022-08-09 DIAGNOSIS — M25551 Pain in right hip: Secondary | ICD-10-CM

## 2022-08-09 NOTE — Telephone Encounter (Signed)
Yes MARS would be great. Thanks.

## 2022-08-09 NOTE — Telephone Encounter (Signed)
Patient also requesting to have her Right hip included in her MRI.Monica KitchenPlease advise

## 2022-08-09 NOTE — Telephone Encounter (Signed)
sure 

## 2022-08-09 NOTE — Telephone Encounter (Signed)
Monica Hong, do we need a mars mri?

## 2022-08-09 NOTE — Telephone Encounter (Signed)
Tried twice to call patient. First time it was disconnected. Second time it appeared to be a fax line.

## 2022-08-09 NOTE — Telephone Encounter (Signed)
Notified patient.

## 2022-08-19 ENCOUNTER — Other Ambulatory Visit: Payer: Medicare HMO

## 2022-08-20 ENCOUNTER — Ambulatory Visit
Admission: RE | Admit: 2022-08-20 | Discharge: 2022-08-20 | Disposition: A | Discharge status: Home / Self Care | Payer: Medicare HMO | Source: Ambulatory Visit | Attending: Orthopaedic Surgery | Admitting: Orthopaedic Surgery

## 2022-08-20 DIAGNOSIS — M48061 Spinal stenosis, lumbar region without neurogenic claudication: Secondary | ICD-10-CM | POA: Diagnosis not present

## 2022-08-20 DIAGNOSIS — M25551 Pain in right hip: Secondary | ICD-10-CM

## 2022-08-20 DIAGNOSIS — M545 Low back pain, unspecified: Secondary | ICD-10-CM | POA: Diagnosis not present

## 2022-08-20 DIAGNOSIS — M5137 Other intervertebral disc degeneration, lumbosacral region: Secondary | ICD-10-CM | POA: Diagnosis not present

## 2022-08-20 DIAGNOSIS — M5136 Other intervertebral disc degeneration, lumbar region: Secondary | ICD-10-CM | POA: Diagnosis not present

## 2022-08-22 ENCOUNTER — Encounter: Payer: Self-pay | Admitting: Orthopaedic Surgery

## 2022-08-22 ENCOUNTER — Ambulatory Visit: Payer: Medicare HMO | Admitting: Orthopaedic Surgery

## 2022-08-22 DIAGNOSIS — M25551 Pain in right hip: Secondary | ICD-10-CM | POA: Diagnosis not present

## 2022-08-22 NOTE — Addendum Note (Signed)
Addended by: Lendon Collar on: 08/22/2022 11:22 AM   Modules accepted: Orders

## 2022-08-22 NOTE — Progress Notes (Signed)
   Office Visit Note   Patient: Monica Martin           Date of Birth: 03/23/1951           MRN: [id number] Visit Date: 08/22/2022              Requested by: Shon Baton, [address] [place],  [place] [address] PCP: Shon Baton, MD   Assessment & Plan: Visit Diagnoses:  1. Pain in right hip     Plan: Impression is chronic right hip pain.  Lumbar spine MRI is relatively unrevealing.  The hip MRI did show fluid collection around the proximal portion of the prosthesis.  Based on this I will send a referral to Dr. Rolena Infante for ultrasound-guided aspiration and Synovasure analysis.  Follow-up after the aspiration.  Follow-Up Instructions: No follow-ups on file.   Orders:  No orders of the defined types were placed in this encounter.  No orders of the defined types were placed in this encounter.     Procedures: No procedures performed   Clinical Data: No additional findings.   Subjective: Chief Complaint  Patient presents with   Lower Back - Follow-up    MRI review   Right Hip - Follow-up    MRI review    HPI  Monica Martin returns today for MRI scan reviews.  Review of Systems   Objective: Vital Signs: There were no vitals taken for this visit.  Physical Exam  Ortho Exam  Exam is unchanged.  She has diffuse right hip and thigh pain.  Specialty Comments:  No specialty comments available.  Imaging: No results found.   PMFS History: Patient Active Problem List   Diagnosis Date Noted   Status post total replacement of left hip 05/10/2022   Primary osteoarthritis of left hip 03/08/2022   Acute blood loss anemia 11/06/2021   Post-operative hemoglobin drop 11/05/2021   Hyperglycemia 11/05/2021   Acute pulmonary embolus ([facility]) 11/03/2021   Adrenal nodule ([facility]) 11/02/2021   Hypoalbuminemia 11/02/2021   Postoperative hemorrhagic shock 11/01/2021   AKI (acute kidney injury) ([facility]) 11/01/2021   Leukocytosis 11/01/2021   Prediabetes 11/01/2021   Sinus  tachycardia 11/01/2021   Obesity (BMI 30-39.9) 11/01/2021   S/P total hip arthroplasty 10/29/2021   ABLA (acute blood loss anemia)    Past Medical History:  Diagnosis Date   History of hiatal hernia    Hyperlipidemia    Osteoarthritis    Pneumonia    Pre-diabetes    Sciatica     No family history on file.  Past Surgical History:  Procedure Laterality Date   BREAST BIOPSY Bilateral    multiple biopsies age 65-32   CESAREAN SECTION  08/08/1979   HIP ARTHROPLASTY Right    JOINT REPLACEMENT Right    TOTAL HIP ARTHROPLASTY Left 05/10/2022   Procedure: LEFT TOTAL HIP ARTHROPLASTY ANTERIOR APPROACH;  Surgeon: Leandrew Koyanagi, MD;  Location: [facility];  Service: Orthopedics;  Laterality: Left;  3-C   Social History   Occupational History   Not on file  Tobacco Use   Smoking status: Never   Smokeless tobacco: Never  Vaping Use   Vaping Use: Never used  Substance and Sexual Activity   Alcohol use: No   Drug use: No   Sexual activity: Not on file

## 2022-08-29 ENCOUNTER — Ambulatory Visit: Payer: Medicare HMO | Admitting: Sports Medicine

## 2022-08-29 ENCOUNTER — Other Ambulatory Visit: Payer: Self-pay | Admitting: Internal Medicine

## 2022-08-29 DIAGNOSIS — I700 Atherosclerosis of aorta: Secondary | ICD-10-CM | POA: Diagnosis not present

## 2022-08-29 DIAGNOSIS — R739 Hyperglycemia, unspecified: Secondary | ICD-10-CM | POA: Diagnosis not present

## 2022-08-29 DIAGNOSIS — E278 Other specified disorders of adrenal gland: Secondary | ICD-10-CM

## 2022-08-29 DIAGNOSIS — R7301 Impaired fasting glucose: Secondary | ICD-10-CM | POA: Diagnosis not present

## 2022-08-29 DIAGNOSIS — I272 Pulmonary hypertension, unspecified: Secondary | ICD-10-CM | POA: Diagnosis not present

## 2022-08-29 DIAGNOSIS — R030 Elevated blood-pressure reading, without diagnosis of hypertension: Secondary | ICD-10-CM | POA: Diagnosis not present

## 2022-08-29 DIAGNOSIS — Z96641 Presence of right artificial hip joint: Secondary | ICD-10-CM | POA: Diagnosis not present

## 2022-08-29 DIAGNOSIS — E785 Hyperlipidemia, unspecified: Secondary | ICD-10-CM | POA: Diagnosis not present

## 2022-08-29 DIAGNOSIS — E669 Obesity, unspecified: Secondary | ICD-10-CM | POA: Diagnosis not present

## 2022-08-30 ENCOUNTER — Encounter: Payer: Self-pay | Admitting: Sports Medicine

## 2022-08-30 ENCOUNTER — Other Ambulatory Visit: Payer: Self-pay

## 2022-08-30 ENCOUNTER — Ambulatory Visit: Payer: Medicare HMO | Admitting: Sports Medicine

## 2022-08-30 ENCOUNTER — Telehealth: Payer: Self-pay | Admitting: *Deleted

## 2022-08-30 DIAGNOSIS — M25551 Pain in right hip: Secondary | ICD-10-CM

## 2022-08-30 DIAGNOSIS — Z96643 Presence of artificial hip joint, bilateral: Secondary | ICD-10-CM

## 2022-08-30 MED ORDER — LIDOCAINE HCL 1 % IJ SOLN
6.0000 mL | INTRAMUSCULAR | Status: AC | PRN
  Administered 2022-08-30: 6 mL

## 2022-08-30 NOTE — Telephone Encounter (Signed)
Ortho bundle 90 day in office appointment completed. 

## 2022-08-30 NOTE — Progress Notes (Signed)
   Procedure Note  Patient: Monica Martin             Date of Birth: 25-Oct-1950           MRN: [id number]             Visit Date: 08/30/2022  HPI: Monica Martin is a very pleasant 72 year-old female who presents today for evaluation of chronic right hip pain s/p THA. Seen by my partner, Dr. Erlinda Hong, had MRI which showed fluid collection. Needed to be evaluated with Ultrasound and possible aspiration to rule out infection. She had noted that her hematoma in the leg seems to be getting smaller.  PE:  - Right hip: There is a large horizontal scar over the anterior hip.  Just distal to this there is a small palpable nodule, indicative of likely hematoma.  There is tenderness to palpation around the foot.  Pain with internal Internal rotation.  No significant redness or warmth.   Procedures: Visit Diagnoses:  1. Pain in right hip   2. Status post total replacement of both hips    Large Joint Inj: R hip joint on 08/30/2022 9:03 AM Indications: pain, joint swelling and diagnostic evaluation Details: 18 G 3.5 in needle, ultrasound-guided anterior approach Medications: 6 mL lidocaine 1 % Aspirate: 4 mL blood-tinged; sent for lab analysis Outcome: tolerated well, no immediate complications  Procedure: US-guided intra-articular hip aspiration, right After discussion on risks/benefits/indications and informed verbal consent was obtained, a timeout was performed. Patient was lying supine on exam table. The hip was cleaned with chloraprep and alcohol swabs. Then utilizing ultrasound guidance, the patient's femoral head and neck junction was identified.  The area was scanned medially and laterally for any joint effusion, although no clear effusion present.  The overlying soft tissue was anesthetized with 6 cc of lidocaine 1%.  Then using ultrasound guidance via an in-plane approach, an 18-gauge 3.5" needle was inserted into the femoral head and neck junction and aspiration of approximately 4cc of clear serous fluid  initially, then became blood-tinged after multiple aspiration passes was removed. The needle was removed and area covered with gauze and pressure applied. No bleeding after this, band-aid applied.  Patient tolerated procedure well without immediate complications.   IMPRESSION: Technically successful US-guided right hip aspiration -> sent for culture  *Not enough fluid yield for synovasure analysis  Procedure, treatment alternatives, risks and benefits explained, specific risks discussed. Consent was given by the patient. Immediately prior to procedure a time out was called to verify the correct patient, procedure, equipment, support staff and site/side marked as required. Patient was prepped and draped in the usual sterile fashion.      Plan:  - Technically successful US-guided aspiration of right hip - 4cc of fluid was aspirated --> not enough for Synovasure, but this was sent for complete fluid analysis and culture. Will notify Dr. Erlinda Hong once these results return for him to decide on next steps going forward - recommend ice, tylenol for pain control - discussed likely the initial collection was hematoma, scant fluid yield   Elba Barman, DO [facility]  This note was dictated using Dragon naturally speaking software and may contain errors in syntax, spelling, or content which have not been identified prior to signing this note.

## 2022-08-30 NOTE — Addendum Note (Signed)
Addended by: Marlyne Beards on: 08/30/2022 09:13 AM   Modules accepted: Orders

## 2022-09-05 LAB — SYNOVIAL FLUID ANALYSIS, COMPLETE
Basophils, %: 0 %
Eosinophils-Synovial: 1 % (ref 0–2)
Lymphocytes-Synovial Fld: 41 % (ref 0–74)
Monocyte/Macrophage: 7 % (ref 0–69)
Neutrophil, Synovial: 51 % — ABNORMAL HIGH (ref 0–24)
Synoviocytes, %: 0 % (ref 0–15)
WBC, Synovial: 752 cells/uL — ABNORMAL HIGH (ref <150)

## 2022-09-05 LAB — ANAEROBIC AND AEROBIC CULTURE
AER RESULT:: NO GROWTH
MICRO NUMBER:: 14718097
MICRO NUMBER:: 14718098
SPECIMEN QUALITY:: ADEQUATE
SPECIMEN QUALITY:: ADEQUATE

## 2022-09-05 LAB — EXTRA LAV TOP TUBE

## 2022-09-18 NOTE — Progress Notes (Unsigned)
   Office Visit Note   Patient: Monica Martin           Date of Birth: 04-Sep-1950           MRN: [id number] Visit Date: 09/19/2022              Requested by: Creola Corn, MD [address] [place],  [place] [address] PCP: Creola Corn, MD   Assessment & Plan: Visit Diagnoses:  1. Pain in right hip     Plan: Impression is 72 year old female with chronic right hip pain.  Fluid aspiration is consistent with postsurgical hematoma.  No signs of infection.  Reassurance was provided.  At this point continue with Tylenol as needed for the discomfort.  She will follow-up with me next month for her 3-month postop visit on the left hip replacement.  Follow-Up Instructions: No follow-ups on file.   Orders:  No orders of the defined types were placed in this encounter.  No orders of the defined types were placed in this encounter.     Procedures: No procedures performed   Clinical Data: No additional findings.   Subjective: No chief complaint on file.   HPI  Patient returns today for follow-up of right hip aspiration results.  Review of Systems   Objective: Vital Signs: There were no vitals taken for this visit.  Physical Exam  Ortho Exam  Examination right hip is unchanged.  Specialty Comments:  No specialty comments available.  Imaging: No results found.   PMFS History: Patient Active Problem List   Diagnosis Date Noted   Status post total replacement of left hip 05/10/2022   Primary osteoarthritis of left hip 03/08/2022   Acute blood loss anemia 11/06/2021   Post-operative hemoglobin drop 11/05/2021   Hyperglycemia 11/05/2021   Acute pulmonary embolus 11/03/2021   Adrenal nodule 11/02/2021   Hypoalbuminemia 11/02/2021   Postoperative hemorrhagic shock 11/01/2021   AKI (acute kidney injury) 11/01/2021   Leukocytosis 11/01/2021   Prediabetes 11/01/2021   Sinus tachycardia 11/01/2021   Obesity (BMI 30-39.9) 11/01/2021   S/P total hip arthroplasty  10/29/2021   ABLA (acute blood loss anemia)    Past Medical History:  Diagnosis Date   History of hiatal hernia    Hyperlipidemia    Osteoarthritis    Pneumonia    Pre-diabetes    Sciatica     No family history on file.  Past Surgical History:  Procedure Laterality Date   BREAST BIOPSY Bilateral    multiple biopsies age 24-30   CESAREAN SECTION  08/08/1979   HIP ARTHROPLASTY Right    JOINT REPLACEMENT Right    TOTAL HIP ARTHROPLASTY Left 05/10/2022   Procedure: LEFT TOTAL HIP ARTHROPLASTY ANTERIOR APPROACH;  Surgeon: Tarry Kos, MD;  Location: [facility];  Service: Orthopedics;  Laterality: Left;  3-C   Social History   Occupational History   Not on file  Tobacco Use   Smoking status: Never   Smokeless tobacco: Never  Vaping Use   Vaping Use: Never used  Substance and Sexual Activity   Alcohol use: No   Drug use: No   Sexual activity: Not on file

## 2022-09-19 ENCOUNTER — Ambulatory Visit: Payer: Medicare HMO | Admitting: Orthopaedic Surgery

## 2022-09-19 DIAGNOSIS — M25551 Pain in right hip: Secondary | ICD-10-CM

## 2022-09-21 ENCOUNTER — Ambulatory Visit
Admission: RE | Admit: 2022-09-21 | Discharge: 2022-09-21 | Disposition: A | Discharge status: Home / Self Care | Payer: Medicare HMO | Source: Ambulatory Visit | Attending: Internal Medicine | Admitting: Internal Medicine

## 2022-09-21 DIAGNOSIS — Z1231 Encounter for screening mammogram for malignant neoplasm of breast: Secondary | ICD-10-CM

## 2022-09-23 ENCOUNTER — Ambulatory Visit
Admission: RE | Admit: 2022-09-23 | Discharge: 2022-09-23 | Disposition: A | Discharge status: Home / Self Care | Payer: Medicare HMO | Source: Ambulatory Visit | Attending: Internal Medicine | Admitting: Internal Medicine

## 2022-09-23 DIAGNOSIS — E278 Other specified disorders of adrenal gland: Secondary | ICD-10-CM

## 2022-09-23 MED ORDER — GADOPICLENOL 0.5 MMOL/ML IV SOLN
9.0000 mL | Freq: Once | INTRAVENOUS | Status: AC | PRN
  Administered 2022-09-23: 9 mL via INTRAVENOUS

## 2022-10-13 NOTE — Progress Notes (Signed)
error 

## 2022-11-08 ENCOUNTER — Ambulatory Visit: Payer: Medicare HMO | Admitting: Orthopaedic Surgery

## 2022-11-08 ENCOUNTER — Other Ambulatory Visit (INDEPENDENT_AMBULATORY_CARE_PROVIDER_SITE_OTHER): Payer: Medicare HMO

## 2022-11-08 ENCOUNTER — Encounter: Payer: Self-pay | Admitting: Orthopaedic Surgery

## 2022-11-08 DIAGNOSIS — Z96642 Presence of left artificial hip joint: Secondary | ICD-10-CM

## 2022-11-08 NOTE — Progress Notes (Signed)
   Post-Op Visit Note   Patient: Monica Martin           Date of Birth: [birth year]           MRN: [id number] Visit Date: 11/08/2022 PCP: Creola Corn, MD   Assessment & Plan:  Chief Complaint:  Chief Complaint  Patient presents with   Left Hip - Follow-up    Left total hip arthroplasty 05/10/2022   Visit Diagnoses:  1. Status post total replacement of left hip     Plan: Patient is a pleasant 72 year old female who comes in today 6 months status post left total hip replacement 05/10/2022.  She has been doing well in regards to the left hip.  She notes some discomfort which she rates at a 5 out of 10 at times.  She is taking over-the-counter medication for pain when needed.  Examination of the left hip reveals slight pain with hip flexion.  No pain with logroll.  She is neurovascular intact distally.  At this point, she will continue to advance with activity as tolerated.  Dental prophylaxis reinforced.  Follow-up in 6 months for repeat evaluation and AP pelvis x-rays.  Follow-Up Instructions: Return in about 6 months (around 05/11/2023).   Orders:  Orders Placed This Encounter  Procedures   XR Pelvis 1-2 Views   No orders of the defined types were placed in this encounter.   Imaging: XR Pelvis 1-2 Views  Result Date: 11/08/2022 Stable left total hip replacement without complications   PMFS History: Patient Active Problem List   Diagnosis Date Noted   Status post total replacement of left hip 05/10/2022   Primary osteoarthritis of left hip 03/08/2022   Acute blood loss anemia 11/06/2021   Post-operative hemoglobin drop 11/05/2021   Hyperglycemia 11/05/2021   Acute pulmonary embolus ([facility]) 11/03/2021   Adrenal nodule ([facility]) 11/02/2021   Hypoalbuminemia 11/02/2021   Postoperative hemorrhagic shock 11/01/2021   AKI (acute kidney injury) ([facility]) 11/01/2021   Leukocytosis 11/01/2021   Prediabetes 11/01/2021   Sinus tachycardia 11/01/2021   Obesity (BMI 30-39.9) 11/01/2021    S/P total hip arthroplasty 10/29/2021   ABLA (acute blood loss anemia)    Past Medical History:  Diagnosis Date   History of hiatal hernia    Hyperlipidemia    Osteoarthritis    Pneumonia    Pre-diabetes    Sciatica     Family History  Problem Relation Age of Onset   Breast cancer Mother 3    Past Surgical History:  Procedure Laterality Date   BREAST BIOPSY Right    BREAST EXCISIONAL BIOPSY Bilateral    multiple sites and dates   CESAREAN SECTION  08/08/1979   HIP ARTHROPLASTY Right    JOINT REPLACEMENT Right    TOTAL HIP ARTHROPLASTY Left 05/10/2022   Procedure: LEFT TOTAL HIP ARTHROPLASTY ANTERIOR APPROACH;  Surgeon: Tarry Kos, MD;  Location: [facility];  Service: Orthopedics;  Laterality: Left;  3-C   Social History   Occupational History   Not on file  Tobacco Use   Smoking status: Never   Smokeless tobacco: Never  Vaping Use   Vaping Use: Never used  Substance and Sexual Activity   Alcohol use: No   Drug use: No   Sexual activity: Not on file

## 2023-03-12 DIAGNOSIS — E278 Other specified disorders of adrenal gland: Secondary | ICD-10-CM | POA: Diagnosis not present

## 2023-03-12 DIAGNOSIS — E785 Hyperlipidemia, unspecified: Secondary | ICD-10-CM | POA: Diagnosis not present

## 2023-03-12 DIAGNOSIS — I272 Pulmonary hypertension, unspecified: Secondary | ICD-10-CM | POA: Diagnosis not present

## 2023-03-12 DIAGNOSIS — R785 Finding of other psychotropic drug in blood: Secondary | ICD-10-CM | POA: Diagnosis not present

## 2023-03-12 DIAGNOSIS — D649 Anemia, unspecified: Secondary | ICD-10-CM | POA: Diagnosis not present

## 2023-03-12 DIAGNOSIS — Z0000 Encounter for general adult medical examination without abnormal findings: Secondary | ICD-10-CM | POA: Diagnosis not present

## 2023-03-12 DIAGNOSIS — Z0189 Encounter for other specified special examinations: Secondary | ICD-10-CM | POA: Diagnosis not present

## 2023-03-12 DIAGNOSIS — Z1212 Encounter for screening for malignant neoplasm of rectum: Secondary | ICD-10-CM | POA: Diagnosis not present

## 2023-03-12 DIAGNOSIS — R739 Hyperglycemia, unspecified: Secondary | ICD-10-CM | POA: Diagnosis not present

## 2023-03-19 DIAGNOSIS — M5430 Sciatica, unspecified side: Secondary | ICD-10-CM | POA: Diagnosis not present

## 2023-03-19 DIAGNOSIS — D649 Anemia, unspecified: Secondary | ICD-10-CM | POA: Diagnosis not present

## 2023-03-19 DIAGNOSIS — G4700 Insomnia, unspecified: Secondary | ICD-10-CM | POA: Diagnosis not present

## 2023-03-19 DIAGNOSIS — I700 Atherosclerosis of aorta: Secondary | ICD-10-CM | POA: Diagnosis not present

## 2023-03-19 DIAGNOSIS — Z0000 Encounter for general adult medical examination without abnormal findings: Secondary | ICD-10-CM | POA: Diagnosis not present

## 2023-03-19 DIAGNOSIS — R82998 Other abnormal findings in urine: Secondary | ICD-10-CM | POA: Diagnosis not present

## 2023-03-19 DIAGNOSIS — R61 Generalized hyperhidrosis: Secondary | ICD-10-CM | POA: Diagnosis not present

## 2023-03-19 DIAGNOSIS — E785 Hyperlipidemia, unspecified: Secondary | ICD-10-CM | POA: Diagnosis not present

## 2023-03-19 DIAGNOSIS — K449 Diaphragmatic hernia without obstruction or gangrene: Secondary | ICD-10-CM | POA: Diagnosis not present

## 2023-03-21 DIAGNOSIS — H524 Presbyopia: Secondary | ICD-10-CM | POA: Diagnosis not present

## 2023-03-21 DIAGNOSIS — H2513 Age-related nuclear cataract, bilateral: Secondary | ICD-10-CM | POA: Diagnosis not present

## 2023-03-21 DIAGNOSIS — H52209 Unspecified astigmatism, unspecified eye: Secondary | ICD-10-CM | POA: Diagnosis not present

## 2023-03-21 DIAGNOSIS — H5203 Hypermetropia, bilateral: Secondary | ICD-10-CM | POA: Diagnosis not present

## 2023-03-21 DIAGNOSIS — H25043 Posterior subcapsular polar age-related cataract, bilateral: Secondary | ICD-10-CM | POA: Diagnosis not present

## 2023-05-01 ENCOUNTER — Ambulatory Visit: Payer: Medicare HMO | Admitting: Orthopaedic Surgery

## 2023-05-02 ENCOUNTER — Other Ambulatory Visit (INDEPENDENT_AMBULATORY_CARE_PROVIDER_SITE_OTHER): Payer: Medicare HMO

## 2023-05-02 ENCOUNTER — Encounter: Payer: Self-pay | Admitting: Orthopaedic Surgery

## 2023-05-02 ENCOUNTER — Ambulatory Visit: Payer: Medicare HMO | Admitting: Orthopaedic Surgery

## 2023-05-02 DIAGNOSIS — Z96642 Presence of left artificial hip joint: Secondary | ICD-10-CM

## 2023-05-02 NOTE — Progress Notes (Signed)
Office Visit Note   Patient: Monica Martin           Date of Birth: May 26, 1951           MRN: [id number] Visit Date: 05/02/2023              Requested by: Creola Corn, MD [address] [place],  [place] [address] PCP: Creola Corn, MD   Assessment & Plan: Visit Diagnoses:  1. Status post total replacement of left hip     Plan: 1 year THA follow up plan  Patient is now one year out from a left total hip arthroplasty.  Right one was done by another surgeon in town.  Patient is doing very well and very pleased with the results with the left.   Radiographs reveal a left total hip arthroplasty in good position, with no evidence of subsidence, loosening, or complicating features.  Painless fluid range of motion.  Leg lengths equal.  It was reinforced that prophylactic antibiotics should be taken with any procedure including but not limited to dental work or colonoscopies. We plan to follow them at 1 year intervals at this time with radiographs at each visit, and as always we should be notified with any questions or concerns in the interim.   Follow-Up Instructions: Return in about 1 year (around 05/01/2024).   Orders:  Orders Placed This Encounter  Procedures   XR Pelvis 1-2 Views   No orders of the defined types were placed in this encounter.     Procedures: No procedures performed   Clinical Data: No additional findings.   Subjective: Chief Complaint  Patient presents with   Left Hip - Follow-up    Left total hip arthroplasty 05/10/2022    HPI  Review of Systems   Objective: Vital Signs: There were no vitals taken for this visit.  Physical Exam  Ortho Exam  Specialty Comments:  No specialty comments available.  Imaging: XR Pelvis 1-2 Views  Result Date: 05/02/2023 Stable left total hip replacement without complications.  Stable right total hip replacement with metallic cerclage.      PMFS History: Patient Active Problem List   Diagnosis Date  Noted   Status post total replacement of left hip 05/10/2022   Primary osteoarthritis of left hip 03/08/2022   Acute blood loss anemia 11/06/2021   Post-operative hemoglobin drop 11/05/2021   Hyperglycemia 11/05/2021   Acute pulmonary embolus ([facility]) 11/03/2021   Adrenal nodule ([facility]) 11/02/2021   Hypoalbuminemia 11/02/2021   Postoperative hemorrhagic shock 11/01/2021   AKI (acute kidney injury) ([facility]) 11/01/2021   Leukocytosis 11/01/2021   Prediabetes 11/01/2021   Sinus tachycardia 11/01/2021   Obesity (BMI 30-39.9) 11/01/2021   S/P total hip arthroplasty 10/29/2021   ABLA (acute blood loss anemia)    Past Medical History:  Diagnosis Date   History of hiatal hernia    Hyperlipidemia    Osteoarthritis    Pneumonia    Pre-diabetes    Sciatica     Family History  Problem Relation Age of Onset   Breast cancer Mother 65    Past Surgical History:  Procedure Laterality Date   BREAST BIOPSY Right    BREAST EXCISIONAL BIOPSY Bilateral    multiple sites and dates   CESAREAN SECTION  08/08/1979   HIP ARTHROPLASTY Right    JOINT REPLACEMENT Right    TOTAL HIP ARTHROPLASTY Left 05/10/2022   Procedure: LEFT TOTAL HIP ARTHROPLASTY ANTERIOR APPROACH;  Surgeon: Tarry Kos, MD;  Location: [facility];  Service: Orthopedics;  Laterality: Left;  3-C   Social History   Occupational History   Not on file  Tobacco Use   Smoking status: Never   Smokeless tobacco: Never  Vaping Use   Vaping status: Never Used  Substance and Sexual Activity   Alcohol use: No   Drug use: No   Sexual activity: Not on file

## 2023-09-06 IMAGING — CT CT KNEE*R* W/O CM
4 of 6 series · 16 of 33 positions shown, 18 images · non-contrast
Comparison: None Available.

CLINICAL DATA: Knee trauma, occult fracture suspected. Status post
right hip surgery 2 weeks ago.

EXAM:
CT OF THE RIGHT KNEE WITHOUT CONTRAST
TECHNIQUE: Multidetector CT imaging of the right knee was performed according
to the standard protocol. Multiplanar CT image reconstructions were
also generated.
RADIATION DOSE REDUCTION: This exam was performed according to the
departmental dose-optimization program which includes automated
exposure control, adjustment of the mA and/or kV according to
patient size and/or use of iterative reconstruction technique.

[Series 8: axial bone · axial · 0.46mm/px · z∈[-427,-310]mm · 5 of 118 slices shown, 7 images]
[im 20/118  soft-tissue]
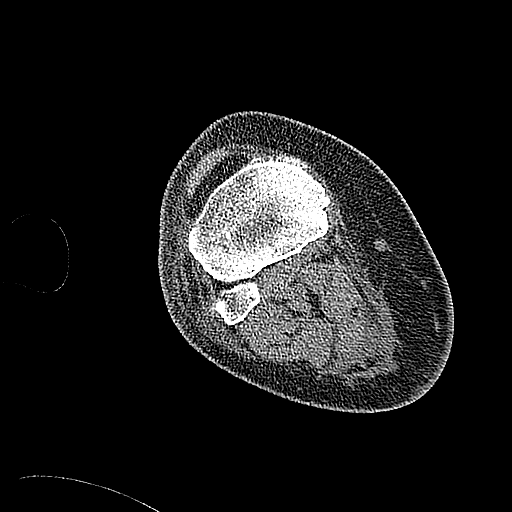
[im 20/118  bone]
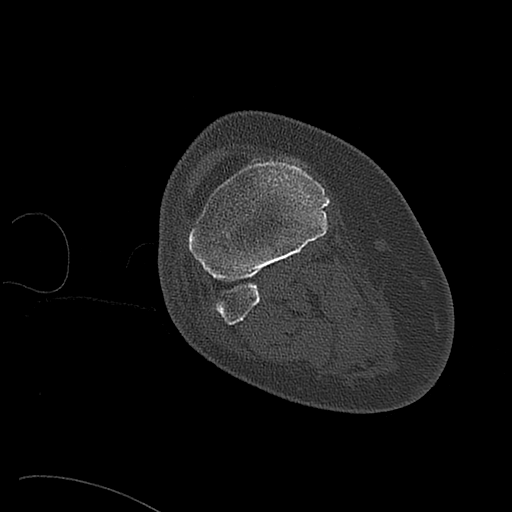
[im 40/118  bone]
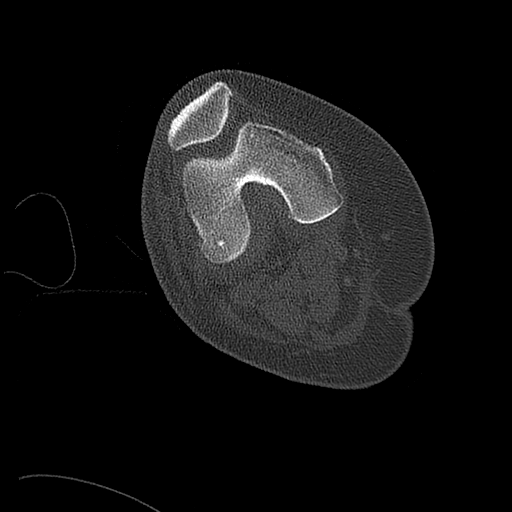
[im 59/118  bone]
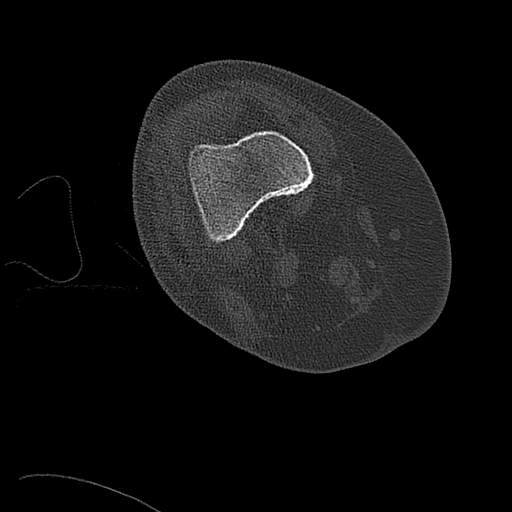
[im 79/118  bone]
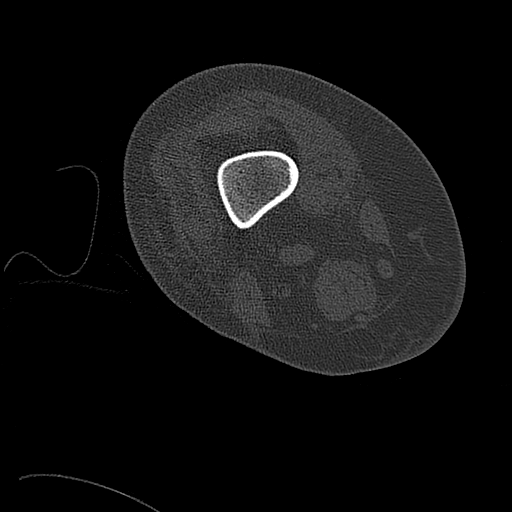
[im 98/118  soft-tissue]
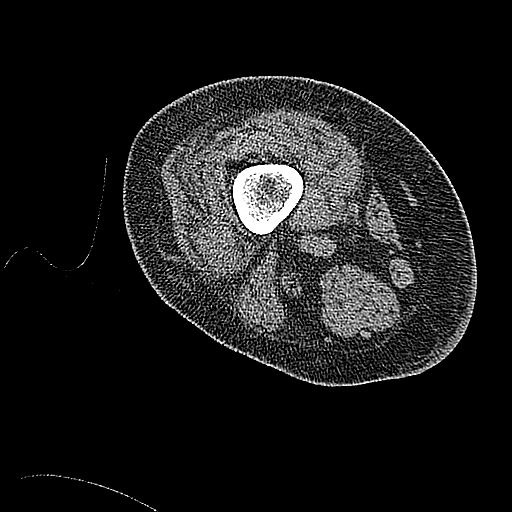
[im 98/118  bone]
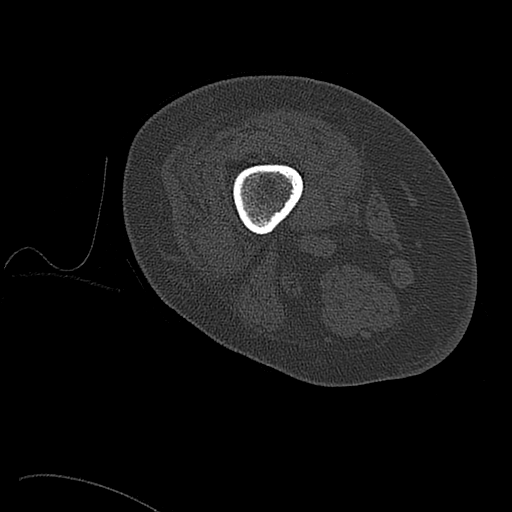

[Series 9: coronal bone · coronal · 0.38mm/px · 1 of 118 slices shown]
[im 59/118  bone]
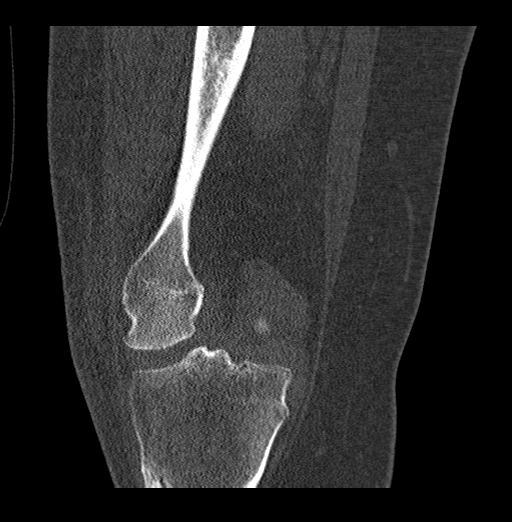

[Series 11: axial st · axial · 0.46mm/px · z∈[-427,-310]mm · 5 of 118 slices shown]
[im 20/118  bone]
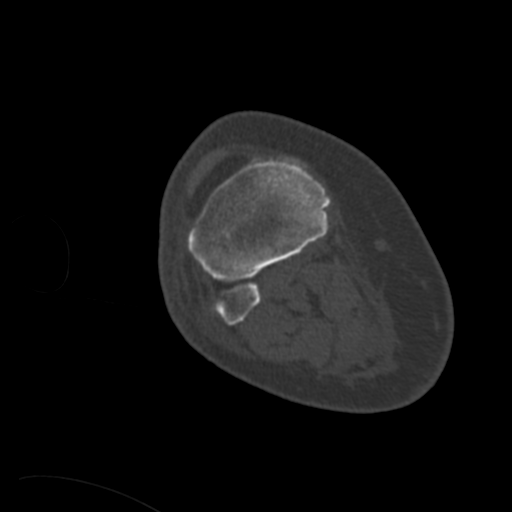
[im 40/118  bone]
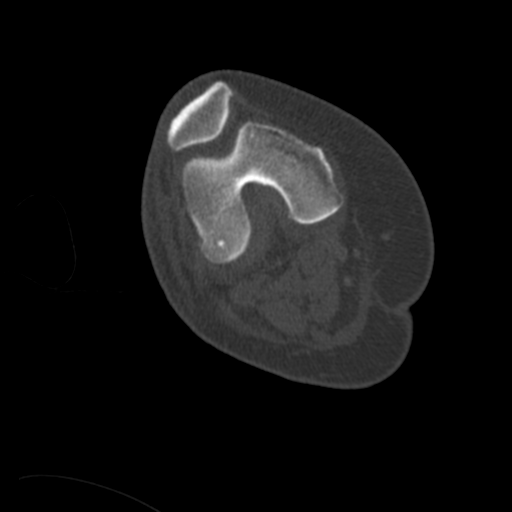
[im 59/118  bone]
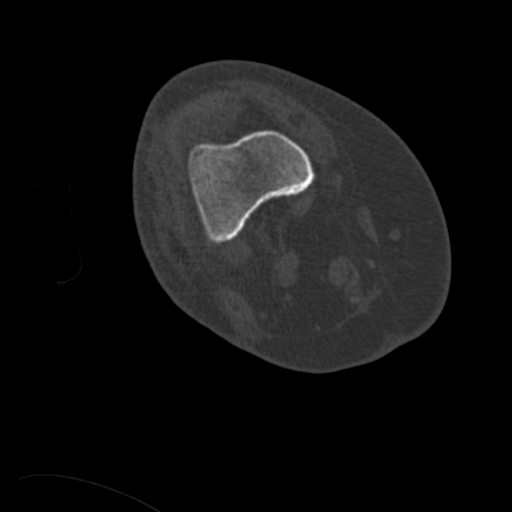
[im 79/118  bone]
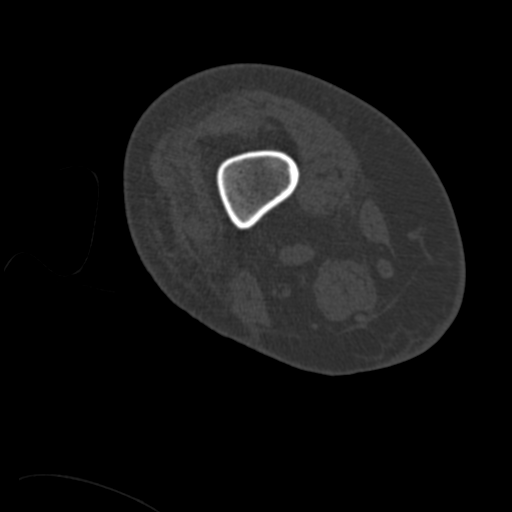
[im 98/118  bone]
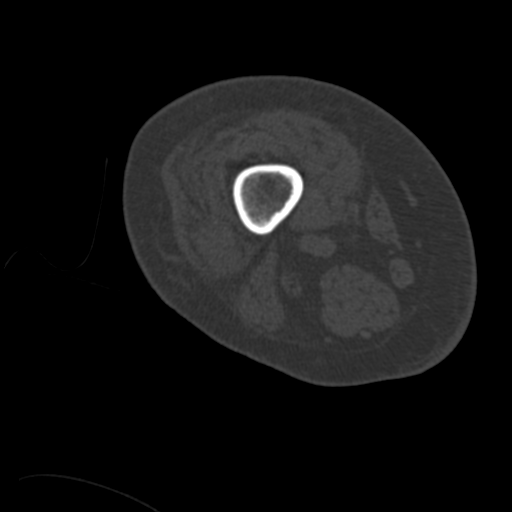

[Series 13: sagittal st · sagittal · 0.35mm/px · 5 of 179 slices shown]
[im 26/179  bone]
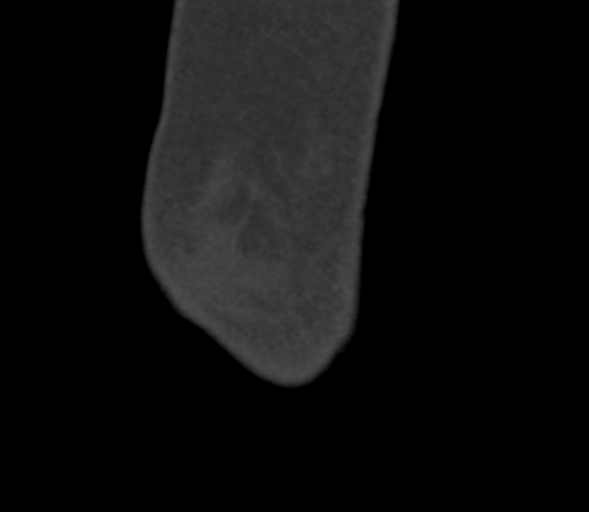
[im 51/179  bone]
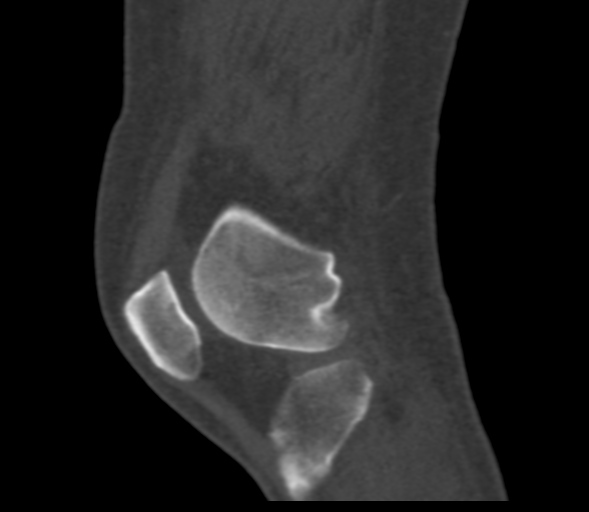
[im 77/179  bone]
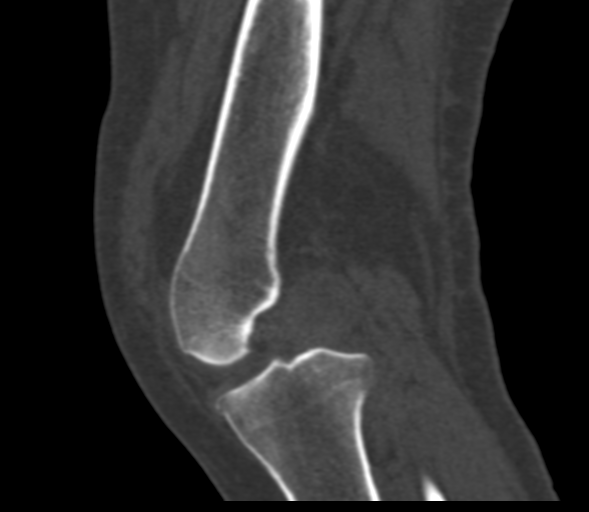
[im 102/179  bone]
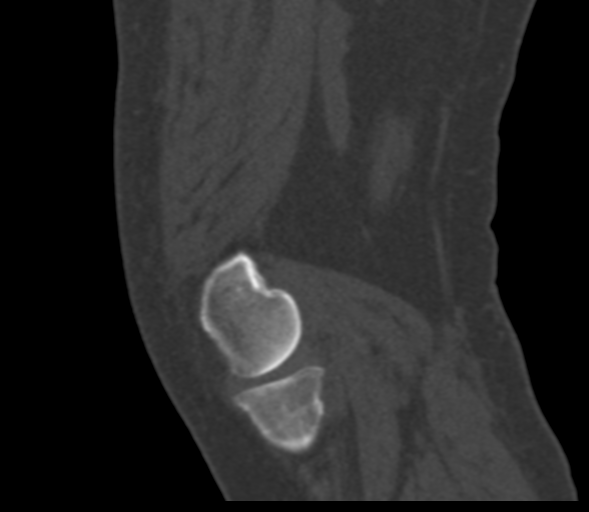
[im 128/179  bone]
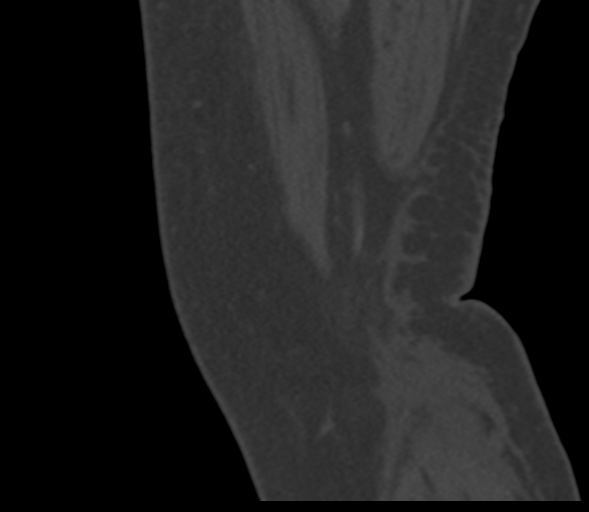

[16 of 33 positions shown; findings below may reference images not displayed]

FINDINGS: Bones/Joint/Cartilage

No evidence of fracture or dislocation. Mild tricompartmental knee
osteoarthritis. No appreciable joint effusion.

Ligaments

Suboptimally assessed by CT.

Muscles and Tendons

Muscles are normal in bulk. The tendons are intact. No intramuscular
hematoma or fluid collection.

Soft tissues

Mild subcutaneous soft tissue fat stranding in the anterior and
posterior compartment of the knee. No fluid collection or hematoma.
IMPRESSION: 1. No evidence of fracture or dislocation.
2. Mild subcutaneous soft tissue edema without evidence of drainable
fluid collection or hematoma.

## 2023-10-05 DIAGNOSIS — E785 Hyperlipidemia, unspecified: Secondary | ICD-10-CM | POA: Diagnosis not present

## 2023-10-05 DIAGNOSIS — I700 Atherosclerosis of aorta: Secondary | ICD-10-CM | POA: Diagnosis not present

## 2023-10-05 DIAGNOSIS — R030 Elevated blood-pressure reading, without diagnosis of hypertension: Secondary | ICD-10-CM | POA: Diagnosis not present

## 2023-10-05 DIAGNOSIS — K449 Diaphragmatic hernia without obstruction or gangrene: Secondary | ICD-10-CM | POA: Diagnosis not present

## 2023-10-05 DIAGNOSIS — R739 Hyperglycemia, unspecified: Secondary | ICD-10-CM | POA: Diagnosis not present

## 2023-10-05 DIAGNOSIS — E669 Obesity, unspecified: Secondary | ICD-10-CM | POA: Diagnosis not present

## 2023-10-05 DIAGNOSIS — I272 Pulmonary hypertension, unspecified: Secondary | ICD-10-CM | POA: Diagnosis not present

## 2023-10-05 DIAGNOSIS — M79672 Pain in left foot: Secondary | ICD-10-CM | POA: Diagnosis not present

## 2023-10-05 DIAGNOSIS — I2699 Other pulmonary embolism without acute cor pulmonale: Secondary | ICD-10-CM | POA: Diagnosis not present

## 2023-11-13 DIAGNOSIS — K219 Gastro-esophageal reflux disease without esophagitis: Secondary | ICD-10-CM | POA: Diagnosis not present

## 2023-11-13 DIAGNOSIS — L819 Disorder of pigmentation, unspecified: Secondary | ICD-10-CM | POA: Diagnosis not present

## 2023-11-13 DIAGNOSIS — L821 Other seborrheic keratosis: Secondary | ICD-10-CM | POA: Diagnosis not present

## 2023-11-13 DIAGNOSIS — R030 Elevated blood-pressure reading, without diagnosis of hypertension: Secondary | ICD-10-CM | POA: Diagnosis not present

## 2024-01-15 DIAGNOSIS — N939 Abnormal uterine and vaginal bleeding, unspecified: Secondary | ICD-10-CM | POA: Diagnosis not present

## 2024-01-15 DIAGNOSIS — N390 Urinary tract infection, site not specified: Secondary | ICD-10-CM | POA: Diagnosis not present

## 2024-01-15 DIAGNOSIS — R3121 Asymptomatic microscopic hematuria: Secondary | ICD-10-CM | POA: Diagnosis not present

## 2024-03-27 DIAGNOSIS — D649 Anemia, unspecified: Secondary | ICD-10-CM | POA: Diagnosis not present

## 2024-03-27 DIAGNOSIS — R739 Hyperglycemia, unspecified: Secondary | ICD-10-CM | POA: Diagnosis not present

## 2024-03-27 DIAGNOSIS — E785 Hyperlipidemia, unspecified: Secondary | ICD-10-CM | POA: Diagnosis not present

## 2024-03-27 DIAGNOSIS — R030 Elevated blood-pressure reading, without diagnosis of hypertension: Secondary | ICD-10-CM | POA: Diagnosis not present

## 2024-03-28 DIAGNOSIS — H2513 Age-related nuclear cataract, bilateral: Secondary | ICD-10-CM | POA: Diagnosis not present

## 2024-03-28 DIAGNOSIS — H43813 Vitreous degeneration, bilateral: Secondary | ICD-10-CM | POA: Diagnosis not present

## 2024-03-28 DIAGNOSIS — H25043 Posterior subcapsular polar age-related cataract, bilateral: Secondary | ICD-10-CM | POA: Diagnosis not present

## 2024-04-03 ENCOUNTER — Other Ambulatory Visit: Payer: Self-pay | Admitting: Internal Medicine

## 2024-04-03 DIAGNOSIS — Z1231 Encounter for screening mammogram for malignant neoplasm of breast: Secondary | ICD-10-CM

## 2024-04-03 DIAGNOSIS — E669 Obesity, unspecified: Secondary | ICD-10-CM | POA: Diagnosis not present

## 2024-04-03 DIAGNOSIS — R82998 Other abnormal findings in urine: Secondary | ICD-10-CM | POA: Diagnosis not present

## 2024-04-03 DIAGNOSIS — I700 Atherosclerosis of aorta: Secondary | ICD-10-CM | POA: Diagnosis not present

## 2024-04-03 DIAGNOSIS — M5430 Sciatica, unspecified side: Secondary | ICD-10-CM | POA: Diagnosis not present

## 2024-04-03 DIAGNOSIS — I272 Pulmonary hypertension, unspecified: Secondary | ICD-10-CM | POA: Diagnosis not present

## 2024-04-03 DIAGNOSIS — M79672 Pain in left foot: Secondary | ICD-10-CM | POA: Diagnosis not present

## 2024-04-03 DIAGNOSIS — E785 Hyperlipidemia, unspecified: Secondary | ICD-10-CM | POA: Diagnosis not present

## 2024-04-03 DIAGNOSIS — K449 Diaphragmatic hernia without obstruction or gangrene: Secondary | ICD-10-CM | POA: Diagnosis not present

## 2024-04-03 DIAGNOSIS — Z0000 Encounter for general adult medical examination without abnormal findings: Secondary | ICD-10-CM | POA: Diagnosis not present

## 2024-04-09 DIAGNOSIS — L814 Other melanin hyperpigmentation: Secondary | ICD-10-CM | POA: Diagnosis not present

## 2024-04-09 DIAGNOSIS — D2371 Other benign neoplasm of skin of right lower limb, including hip: Secondary | ICD-10-CM | POA: Diagnosis not present

## 2024-04-09 DIAGNOSIS — D1801 Hemangioma of skin and subcutaneous tissue: Secondary | ICD-10-CM | POA: Diagnosis not present

## 2024-04-09 DIAGNOSIS — L821 Other seborrheic keratosis: Secondary | ICD-10-CM | POA: Diagnosis not present

## 2024-04-14 ENCOUNTER — Encounter: Payer: Self-pay | Admitting: Radiology

## 2024-04-18 ENCOUNTER — Ambulatory Visit
Admission: RE | Admit: 2024-04-18 | Discharge: 2024-04-18 | Disposition: A | Discharge status: Home / Self Care | Source: Ambulatory Visit | Attending: Internal Medicine | Admitting: Internal Medicine

## 2024-04-18 DIAGNOSIS — Z1231 Encounter for screening mammogram for malignant neoplasm of breast: Secondary | ICD-10-CM

## 2024-04-30 ENCOUNTER — Other Ambulatory Visit (INDEPENDENT_AMBULATORY_CARE_PROVIDER_SITE_OTHER): Payer: Self-pay

## 2024-04-30 ENCOUNTER — Other Ambulatory Visit: Payer: Self-pay

## 2024-04-30 ENCOUNTER — Ambulatory Visit: Admitting: Orthopaedic Surgery

## 2024-04-30 DIAGNOSIS — M25551 Pain in right hip: Secondary | ICD-10-CM

## 2024-04-30 DIAGNOSIS — Z96642 Presence of left artificial hip joint: Secondary | ICD-10-CM | POA: Diagnosis not present

## 2024-04-30 NOTE — Progress Notes (Signed)
   Post-Op Visit Note   Patient: Monica Martin           Date of Birth: Nov 21, 1950           MRN: [id number] Visit Date: 04/30/2024 PCP: Onita Rush, MD   Assessment & Plan:  Chief Complaint:  Chief Complaint  Patient presents with   Left Hip - Follow-up    Left THA 05/10/2022   Right Hip - Pain   Visit Diagnoses:  1. Status post total replacement of left hip   2. Pain in right hip     Plan: History of Present Illness Monica Martin is a 73 year old female who presents for 2 year postop visit from left THA.  Reports no issue with the left hip.  Very happy with outcome.  Assessment and Plan Left total hip arthroplasty, stable Left hip arthroplasty with ingrown implants. - No routine follow-up unless issues arise.  Follow-Up Instructions: No follow-ups on file.   Orders:  Orders Placed This Encounter  Procedures   XR HIPS BILAT W OR W/O PELVIS 3-4 VIEWS   No orders of the defined types were placed in this encounter.   Imaging: XR HIPS BILAT W OR W/O PELVIS 3-4 VIEWS Result Date: 04/30/2024 Pelvis xrays show left THA without complications.  Remodeling of the calcar consistent with a well ingrown stem.     PMFS History: Patient Active Problem List   Diagnosis Date Noted   Status post total replacement of left hip 05/10/2022   Primary osteoarthritis of left hip 03/08/2022   Acute blood loss anemia 11/06/2021   Post-operative hemoglobin drop 11/05/2021   Hyperglycemia 11/05/2021   Acute pulmonary embolus ([facility]) 11/03/2021   Adrenal nodule 11/02/2021   Hypoalbuminemia 11/02/2021   Postoperative hemorrhagic shock 11/01/2021   AKI (acute kidney injury) 11/01/2021   Leukocytosis 11/01/2021   Prediabetes 11/01/2021   Sinus tachycardia 11/01/2021   Obesity (BMI 30-39.9) 11/01/2021   S/P total hip arthroplasty 10/29/2021   ABLA (acute blood loss anemia)    Past Medical History:  Diagnosis Date   History of hiatal hernia    Hyperlipidemia    Osteoarthritis     Pneumonia    Pre-diabetes    Sciatica     Family History  Problem Relation Age of Onset   Breast cancer Mother 56    Past Surgical History:  Procedure Laterality Date   BREAST BIOPSY Right    BREAST EXCISIONAL BIOPSY Bilateral    multiple sites and dates   CESAREAN SECTION  08/08/1979   HIP ARTHROPLASTY Right    JOINT REPLACEMENT Right    TOTAL HIP ARTHROPLASTY Left 05/10/2022   Procedure: LEFT TOTAL HIP ARTHROPLASTY ANTERIOR APPROACH;  Surgeon: Jerri Kay HERO, MD;  Location: [facility];  Service: Orthopedics;  Laterality: Left;  3-C   Social History   Occupational History   Not on file  Tobacco Use   Smoking status: Never   Smokeless tobacco: Never  Vaping Use   Vaping status: Never Used  Substance and Sexual Activity   Alcohol  use: No   Drug use: No   Sexual activity: Not on file
